# Patient Record
Sex: Male | Born: 1970
Health system: Southern US, Academic
[De-identification: ages and names within clinical notes are randomized; demographics above are authoritative.]

## PROBLEM LIST (undated history)

## (undated) DIAGNOSIS — T7840XA Allergy, unspecified, initial encounter: Secondary | ICD-10-CM

## (undated) DIAGNOSIS — G4730 Sleep apnea, unspecified: Secondary | ICD-10-CM

## (undated) HISTORY — PX: HERNIA REPAIR: SHX51

## (undated) HISTORY — DX: Sleep apnea, unspecified: G47.30

## (undated) HISTORY — DX: Allergy, unspecified, initial encounter: T78.40XA

## (undated) HISTORY — PX: URETEROSCOPY WITH HOLMIUM LASER LITHOTRIPSY: SHX6645

---

## 1995-09-25 ENCOUNTER — Ambulatory Visit (HOSPITAL_COMMUNITY): Payer: Self-pay | Admitting: Family Medicine

## 2005-09-03 ENCOUNTER — Ambulatory Visit: Payer: Self-pay | Admitting: Otolaryngology

## 2007-11-02 ENCOUNTER — Ambulatory Visit: Payer: Self-pay | Admitting: Otolaryngology

## 2008-04-27 ENCOUNTER — Ambulatory Visit: Payer: Self-pay | Admitting: Urology

## 2008-05-05 ENCOUNTER — Ambulatory Visit: Payer: Self-pay | Admitting: Urology

## 2008-07-06 ENCOUNTER — Ambulatory Visit: Payer: Self-pay | Admitting: Urology

## 2008-09-13 ENCOUNTER — Ambulatory Visit: Payer: Self-pay | Admitting: Urology

## 2010-07-15 ENCOUNTER — Emergency Department: Payer: Self-pay | Admitting: Emergency Medicine

## 2011-01-09 ENCOUNTER — Ambulatory Visit: Payer: Self-pay | Admitting: Urology

## 2012-03-08 ENCOUNTER — Ambulatory Visit: Payer: Self-pay | Admitting: Urology

## 2012-11-09 DIAGNOSIS — G35 Multiple sclerosis: Secondary | ICD-10-CM | POA: Insufficient documentation

## 2012-11-09 DIAGNOSIS — Q792 Exomphalos: Secondary | ICD-10-CM

## 2012-11-09 HISTORY — DX: Exomphalos: Q79.2

## 2012-11-09 HISTORY — DX: Multiple sclerosis: G35

## 2012-12-01 DIAGNOSIS — G4733 Obstructive sleep apnea (adult) (pediatric): Secondary | ICD-10-CM | POA: Insufficient documentation

## 2012-12-01 HISTORY — DX: Obstructive sleep apnea (adult) (pediatric): G47.33

## 2013-04-29 ENCOUNTER — Ambulatory Visit: Payer: Self-pay | Admitting: Urology

## 2013-05-09 DIAGNOSIS — N200 Calculus of kidney: Secondary | ICD-10-CM

## 2013-05-09 DIAGNOSIS — N23 Unspecified renal colic: Secondary | ICD-10-CM | POA: Insufficient documentation

## 2013-05-09 HISTORY — DX: Unspecified renal colic: N23

## 2013-05-09 HISTORY — DX: Calculus of kidney: N20.0

## 2014-07-04 DIAGNOSIS — R339 Retention of urine, unspecified: Secondary | ICD-10-CM

## 2014-07-04 DIAGNOSIS — R1030 Lower abdominal pain, unspecified: Secondary | ICD-10-CM

## 2014-07-04 HISTORY — DX: Lower abdominal pain, unspecified: R10.30

## 2014-07-04 HISTORY — DX: Retention of urine, unspecified: R33.9

## 2015-02-28 DIAGNOSIS — M792 Neuralgia and neuritis, unspecified: Secondary | ICD-10-CM | POA: Insufficient documentation

## 2015-02-28 DIAGNOSIS — N411 Chronic prostatitis: Secondary | ICD-10-CM | POA: Insufficient documentation

## 2015-02-28 HISTORY — DX: Chronic prostatitis: N41.1

## 2015-02-28 HISTORY — DX: Neuralgia and neuritis, unspecified: M79.2

## 2015-05-10 ENCOUNTER — Encounter: Payer: Self-pay | Admitting: Family Medicine

## 2015-05-10 ENCOUNTER — Ambulatory Visit (INDEPENDENT_AMBULATORY_CARE_PROVIDER_SITE_OTHER): Payer: BC Managed Care – PPO | Admitting: Family Medicine

## 2015-05-10 VITALS — BP 114/72 | HR 76 | Temp 97.9°F | Resp 16 | Ht 70.0 in | Wt 251.6 lb

## 2015-05-10 DIAGNOSIS — J0100 Acute maxillary sinusitis, unspecified: Secondary | ICD-10-CM

## 2015-05-10 MED ORDER — AMOXICILLIN-POT CLAVULANATE 875-125 MG PO TABS: 1.0000 | ORAL_TABLET | Freq: Two times a day (BID) | ORAL | Status: DC

## 2015-05-10 NOTE — Progress Notes (Signed)
Name: Miguel [name]   MRN: [id number]    DOB: [birth year]   Date:05/10/2015       Progress Note  Subjective  Chief Complaint  Chief Complaint  Patient presents with  . Facial Pain    HPI Here c/o nasal and sinus congestion.  Sx. X 2-3 days.  Nasal mucus brownish with some blood.  + headache and facial pain.  Started as a cold > 1 week ago.  Cold was getting better, then sinus pressure started.  No fever.  Upper teeth hurt.  No problem-specific assessment & plan notes found for this encounter.   Past Medical History  Diagnosis Date  . Allergy   . Sleep apnea     Social History  Substance Use Topics  . Smoking status: Never Smoker   . Smokeless tobacco: Never Used  . Alcohol Use: No     Current outpatient prescriptions:  .  baclofen (LIORESAL) 10 MG tablet, TK 1 T PO  TID PRN FOR SPASMS, Disp: , Rfl: 0 .  Cholecalciferol (D 1000) 1000 UNITS capsule, Take by mouth., Disp: , Rfl:  .  Cyanocobalamin (VITAMIN B-12) 5000 MCG SUBL, Place under the tongue., Disp: , Rfl:  .  gabapentin (NEURONTIN) 300 MG capsule, Take 300 mg by mouth., Disp: , Rfl:  .  ibuprofen (ADVIL,MOTRIN) 800 MG tablet, Take 800 mg by mouth., Disp: , Rfl:  .  interferon beta-1a (AVONEX PREFILLED) 30 MCG/0.5ML PSKT injection, Frequency:1XW   Dosage:0.0     Instructions:  Note:Dose: 30MCG/0.5ML, Disp: , Rfl:   No Known Allergies  Review of Systems  Constitutional: Negative for fever, chills, weight loss and malaise/fatigue.  HENT: Positive for congestion. Negative for sore throat.   Eyes: Negative for blurred vision and double vision.  Respiratory: Negative for cough, shortness of breath and wheezing.   Cardiovascular: Negative for chest pain, palpitations and leg swelling.  Gastrointestinal: Negative for heartburn, abdominal pain and blood in stool.  Genitourinary: Negative for dysuria, urgency and frequency.  Skin: Negative for rash.  Neurological: Positive for headaches. Negative for weakness.       Objective  Filed Vitals:   05/10/15 0947  BP: 114/72  Pulse: 76  Temp: 97.9 F (36.6 C)  Resp: 16  Height: 5\' 10"  (1.778 m)  Weight: 251 lb 9.6 oz (114.125 kg)     Physical Exam  Constitutional: He is well-developed, well-nourished, and in no distress. No distress.  HENT:  Head: Normocephalic and atraumatic.  Right Ear: External ear normal.  Nose: Mucosal edema and rhinorrhea present. Right sinus exhibits maxillary sinus tenderness. Left sinus exhibits maxillary sinus tenderness.  Mouth/Throat: No oropharyngeal exudate, posterior oropharyngeal edema or posterior oropharyngeal erythema.  Cardiovascular: Normal rate and normal heart sounds.  Exam reveals no gallop and no friction rub.   No murmur heard. Pulmonary/Chest: Effort normal and breath sounds normal. No respiratory distress. He has no wheezes. He has no rales.  Lymphadenopathy:    He has no cervical adenopathy.  Vitals reviewed.     No results found for this or any previous visit (from the past 2160 hour(s)).   Assessment & Plan  1. Acute maxillary sinusitis, recurrence not specified  - amoxicillin-clavulanate (AUGMENTIN) 875-125 MG tablet; Take 1 tablet by mouth 2 (two) times daily.  Dispense: 20 tablet; Refill: 0

## 2015-05-10 NOTE — Patient Instructions (Signed)
Use Mucinex D and Afrin as needed

## 2015-07-23 DIAGNOSIS — M25519 Pain in unspecified shoulder: Secondary | ICD-10-CM

## 2015-07-23 HISTORY — DX: Pain in unspecified shoulder: M25.519

## 2015-10-26 DIAGNOSIS — M25559 Pain in unspecified hip: Secondary | ICD-10-CM

## 2015-10-26 HISTORY — DX: Pain in unspecified hip: M25.559

## 2016-01-07 ENCOUNTER — Encounter: Payer: Self-pay | Admitting: Family Medicine

## 2016-01-07 ENCOUNTER — Other Ambulatory Visit: Payer: Self-pay | Admitting: Family Medicine

## 2016-01-07 ENCOUNTER — Ambulatory Visit (INDEPENDENT_AMBULATORY_CARE_PROVIDER_SITE_OTHER): Payer: BC Managed Care – PPO | Admitting: Family Medicine

## 2016-01-07 ENCOUNTER — Ambulatory Visit: Payer: BC Managed Care – PPO | Admitting: Family Medicine

## 2016-01-07 VITALS — BP 131/83 | HR 79 | Temp 98.8°F | Resp 16 | Ht 70.0 in | Wt 252.0 lb

## 2016-01-07 DIAGNOSIS — G35 Multiple sclerosis: Secondary | ICD-10-CM

## 2016-01-07 DIAGNOSIS — E0800 Diabetes mellitus due to underlying condition with hyperosmolarity without nonketotic hyperglycemic-hyperosmolar coma (NKHHC): Secondary | ICD-10-CM

## 2016-01-07 DIAGNOSIS — E119 Type 2 diabetes mellitus without complications: Secondary | ICD-10-CM

## 2016-01-07 DIAGNOSIS — E1169 Type 2 diabetes mellitus with other specified complication: Secondary | ICD-10-CM | POA: Insufficient documentation

## 2016-01-07 LAB — POCT GLYCOSYLATED HEMOGLOBIN (HGB A1C): Hemoglobin A1C: 12.1

## 2016-01-07 MED ORDER — METFORMIN HCL 1000 MG PO TABS: 1000.0000 mg | ORAL_TABLET | Freq: Two times a day (BID) | ORAL | 3 refills | Status: DC

## 2016-01-07 MED ORDER — INSULIN GLARGINE 100 UNIT/ML SOLOSTAR PEN: PEN_INJECTOR | SUBCUTANEOUS | 99 refills | Status: DC

## 2016-01-07 NOTE — Progress Notes (Signed)
Name: Miguel [name]   MRN: [id number]    DOB: [birth year]   Date:01/07/2016       Progress Note  Subjective  Chief Complaint  Chief Complaint  Patient presents with  . Diabetes    HPI  Here because of elevated BS.  B S last night was >400 and >300 this AM.  He has had increased thirst and increased urination but no nocturia.  No excessive fatigue.  Needs reading glasses now at times.  Vision not espessively blurry. No problem-specific Assessment & Plan notes found for this encounter.   Past Medical History:  Diagnosis Date  . Allergy   . Sleep apnea     Past Surgical History:  Procedure Laterality Date  . HERNIA REPAIR      Family History  Problem Relation Age of Onset  . Family history unknown: Yes    Social History   Social History  . Marital status: Married    Spouse name: N/A  . Number of children: N/A  . Years of education: N/A   Occupational History  . Not on file.   Social History Main Topics  . Smoking status: Never Smoker  . Smokeless tobacco: Never Used  . Alcohol use No  . Drug use: No  . Sexual activity: Not on file   Other Topics Concern  . Not on file   Social History Narrative  . No narrative on file     Current Outpatient Prescriptions:  .  baclofen (LIORESAL) 10 MG tablet, TK 1 T PO  TID PRN FOR SPASMS, Disp: , Rfl: 0 .  Cholecalciferol (D 1000) 1000 UNITS capsule, Take by mouth., Disp: , Rfl:  .  Cyanocobalamin (VITAMIN B-12) 5000 MCG SUBL, Place under the tongue., Disp: , Rfl:  .  gabapentin (NEURONTIN) 300 MG capsule, Take 300 mg by mouth continuous as needed. , Disp: , Rfl:  .  ibuprofen (ADVIL,MOTRIN) 800 MG tablet, Take 800 mg by mouth every 6 (six) hours as needed. , Disp: , Rfl:  .  interferon beta-1a (AVONEX PREFILLED) 30 MCG/0.5ML PSKT injection, Frequency:1XW   Dosage:0.0     Instructions:  Note:Dose: 30MCG/0.5ML, Disp: , Rfl:  .  Insulin Glargine (LANTUS SOLOSTAR) 100 UNIT/ML Solostar Pen, Start with 10 units daily and  increase 2 units every 2-3 days until fasting Blood sugar <120/, Disp: 5 pen, Rfl: PRN .  metFORMIN (GLUCOPHAGE) 1000 MG tablet, Take 1 tablet (1,000 mg total) by mouth 2 (two) times daily with a meal., Disp: 180 tablet, Rfl: 3  Not on File   Review of Systems  Constitutional: Negative for chills, fever, malaise/fatigue and weight loss.  HENT: Negative for hearing loss.   Eyes: Negative for blurred vision and double vision.  Respiratory: Negative for cough, shortness of breath and wheezing.   Cardiovascular: Negative for chest pain, palpitations and leg swelling.  Gastrointestinal: Negative for abdominal pain, blood in stool, heartburn and nausea.  Genitourinary: Positive for frequency. Negative for dysuria and urgency.  Musculoskeletal: Positive for myalgias.  Skin: Negative for rash.  Neurological: Negative for tremors, weakness and headaches.      Objective  Vitals:   01/07/16 1623  BP: 131/83  Pulse: 79  Resp: 16  Temp: 98.8 F (37.1 C)  TempSrc: Oral  Weight: 252 lb (114.3 kg)  Height: 5\' 10"  (1.778 m)    Physical Exam  Constitutional: He is oriented to person, place, and time. He appears distressed.  HENT:  Head: Normocephalic and atraumatic.  Eyes: Conjunctivae  and EOM are normal. Pupils are equal, round, and reactive to light. No scleral icterus.  Fundoscopic exam:      The right eye shows no arteriolar narrowing, no AV nicking, no exudate, no hemorrhage and no papilledema.       The left eye shows no arteriolar narrowing, no AV nicking, no exudate, no hemorrhage and no papilledema.  Neck: Normal range of motion. Neck supple. Carotid bruit is not present. No thyromegaly present.  Cardiovascular: Normal rate, regular rhythm and normal heart sounds.  Exam reveals no gallop and no friction rub.   No murmur heard. Pulmonary/Chest: Effort normal and breath sounds normal. No respiratory distress. He has no wheezes. He has no rales.  Abdominal: Soft. Bowel sounds are  normal. He exhibits no distension and no mass. There is no tenderness.  Musculoskeletal: He exhibits no edema.  Lymphadenopathy:    He has no cervical adenopathy.  Neurological: He is alert and oriented to person, place, and time.       Recent Results (from the past 2160 hour(s))  POCT HgB A1C     Status: Abnormal   Collection Time: 01/07/16  4:43 PM  Result Value Ref Range   Hemoglobin A1C 12.1%      Assessment & Plan  Problem List Items Addressed This Visit      Endocrine   Diabetes ([place])   Relevant Medications   metFORMIN (GLUCOPHAGE) 1000 MG tablet   Insulin Glargine (LANTUS SOLOSTAR) 100 UNIT/ML Solostar Pen     Nervous and Auditory   Multiple sclerosis (HCC)    Other Visit Diagnoses    Diabetes mellitus without complication ([place])    -  Primary   Relevant Medications   metFORMIN (GLUCOPHAGE) 1000 MG tablet   Insulin Glargine (LANTUS SOLOSTAR) 100 UNIT/ML Solostar Pen   Other Relevant Orders   POCT HgB A1C (Completed)   COMPLETE METABOLIC PANEL WITH GFR   Lipid Profile   CBC with Differential   TSH      Meds ordered this encounter  Medications  . metFORMIN (GLUCOPHAGE) 1000 MG tablet    Sig: Take 1 tablet (1,000 mg total) by mouth 2 (two) times daily with a meal.    Dispense:  180 tablet    Refill:  3  . Insulin Glargine (LANTUS SOLOSTAR) 100 UNIT/ML Solostar Pen    Sig: Start with 10 units daily and increase 2 units every 2-3 days until fasting Blood sugar <120/    Dispense:  5 pen    Refill:  PRN   1. Diabetes mellitus without complication (HCC)  - POCT HgB A1C-12.1 - COMPLETE METABOLIC PANEL WITH GFR - Lipid Profile - CBC with Differential - TSH - metFORMIN (GLUCOPHAGE) 1000 MG tablet; Take 1 tablet (1,000 mg total) by mouth 2 (two) times daily with a meal.  Dispense: 180 tablet; Refill: 3 - Insulin Glargine (LANTUS SOLOSTAR) 100 UNIT/ML Solostar Pen; Start with 10 units daily and increase 2 units every 2-3 days until fasting Blood sugar <120/   Dispense: 5 pen; Refill: PRN  2. Diabetes mellitus due to underlying condition with hyperosmolarity without coma, without long-term current use of insulin ([place])   3. Multiple sclerosis ([place]) cont meds

## 2016-01-08 ENCOUNTER — Other Ambulatory Visit: Payer: Self-pay | Admitting: Family Medicine

## 2016-01-08 LAB — LIPID PANEL
Cholesterol: 203 mg/dL — ABNORMAL HIGH (ref 125–200)
HDL: 28 mg/dL — AB (ref >=40)
LDL CALC: 128 mg/dL (ref <130)
TRIGLYCERIDES: 235 mg/dL — AB (ref <150)
Total CHOL/HDL Ratio: 7.3 Ratio — ABNORMAL HIGH (ref <=5.0)
VLDL: 47 mg/dL — ABNORMAL HIGH (ref <30)

## 2016-01-08 LAB — COMPLETE METABOLIC PANEL WITH GFR
ALBUMIN: 4.4 g/dL (ref 3.6–5.1)
ALK PHOS: 80 U/L (ref 40–115)
ALT: 56 U/L — AB (ref 9–46)
AST: 49 U/L — ABNORMAL HIGH (ref 10–40)
BILIRUBIN TOTAL: 1.4 mg/dL — AB (ref 0.2–1.2)
BUN: 12 mg/dL (ref 7–25)
CO2: 25 mmol/L (ref 20–31)
Calcium: 9.3 mg/dL (ref 8.6–10.3)
Chloride: 98 mmol/L (ref 98–110)
Creat: 0.79 mg/dL (ref 0.60–1.35)
GLUCOSE: 274 mg/dL — AB (ref 65–99)
Potassium: 4.3 mmol/L (ref 3.5–5.3)
Sodium: 134 mmol/L — ABNORMAL LOW (ref 135–146)
TOTAL PROTEIN: 6.9 g/dL (ref 6.1–8.1)

## 2016-01-08 MED ORDER — ONETOUCH ULTRA SYSTEM W/DEVICE KIT
1.0000 | PACK | Freq: Once | 0 refills | Status: AC
Start: 2016-01-08 — End: 2016-01-08

## 2016-01-08 MED ORDER — ONETOUCH ULTRASOFT LANCETS MISC: 12 refills | Status: DC

## 2016-01-08 MED ORDER — GLUCOSE BLOOD VI STRP: ORAL_STRIP | 12 refills | Status: DC

## 2016-01-09 LAB — CBC WITH DIFFERENTIAL/PLATELET
BASOS PCT: 0 %
Basophils Absolute: 0 cells/uL (ref 0–200)
EOS PCT: 1 %
Eosinophils Absolute: 57 cells/uL (ref 15–500)
HEMATOCRIT: 49.6 % (ref 38.5–50.0)
HEMOGLOBIN: 17.1 g/dL (ref 13.2–17.1)
LYMPHS ABS: 1767 {cells}/uL (ref 850–3900)
Lymphocytes Relative: 31 %
MCH: 31.1 pg (ref 27.0–33.0)
MCHC: 34.5 g/dL (ref 32.0–36.0)
MCV: 90.3 fL (ref 80.0–100.0)
MONO ABS: 513 {cells}/uL (ref 200–950)
MPV: 10.8 fL (ref 7.5–12.5)
Monocytes Relative: 9 %
NEUTROS ABS: 3363 {cells}/uL (ref 1500–7800)
NEUTROS PCT: 59 %
Platelets: 161 10*3/uL (ref 140–400)
RBC: 5.49 MIL/uL (ref 4.20–5.80)
RDW: 12.8 % (ref 11.0–15.0)
WBC: 5.7 10*3/uL (ref 3.8–10.8)

## 2016-01-09 LAB — TSH: TSH: 1.27 m[IU]/L (ref 0.40–4.50)

## 2016-01-10 ENCOUNTER — Ambulatory Visit: Payer: BC Managed Care – PPO | Admitting: Family Medicine

## 2016-01-10 LAB — HEPATITIS C ANTIBODY: HCV AB: NEGATIVE

## 2016-01-14 ENCOUNTER — Ambulatory Visit (INDEPENDENT_AMBULATORY_CARE_PROVIDER_SITE_OTHER): Payer: BC Managed Care – PPO | Admitting: Family Medicine

## 2016-01-14 ENCOUNTER — Encounter: Payer: Self-pay | Admitting: Family Medicine

## 2016-01-14 VITALS — BP 119/79 | HR 76 | Temp 98.6°F | Resp 16 | Ht 70.0 in | Wt 245.0 lb

## 2016-01-14 DIAGNOSIS — E0800 Diabetes mellitus due to underlying condition with hyperosmolarity without nonketotic hyperglycemic-hyperosmolar coma (NKHHC): Secondary | ICD-10-CM

## 2016-01-14 DIAGNOSIS — E785 Hyperlipidemia, unspecified: Secondary | ICD-10-CM | POA: Diagnosis not present

## 2016-01-14 DIAGNOSIS — E1169 Type 2 diabetes mellitus with other specified complication: Secondary | ICD-10-CM

## 2016-01-14 HISTORY — DX: Type 2 diabetes mellitus with other specified complication: E11.69

## 2016-01-14 HISTORY — DX: Hyperlipidemia, unspecified: E78.5

## 2016-01-14 MED ORDER — ATORVASTATIN CALCIUM 20 MG PO TABS: 20.0000 mg | ORAL_TABLET | Freq: Every day | ORAL | 3 refills | Status: DC

## 2016-01-14 NOTE — Progress Notes (Signed)
Name: Miguel [name]   MRN: [id number]    DOB: 10/30/1970   Date:01/14/2016       Progress Note  Subjective  Chief Complaint  Chief Complaint  Patient presents with  . Diabetes    HPI Here for f/u of DM.  He has lost some weight and his BSs have dropped to 130s-150s.Marland Kitchen  He c/o some blurred vision.    No problem-specific Assessment & Plan notes found for this encounter.   Past Medical History:  Diagnosis Date  . Allergy   . Sleep apnea     Past Surgical History:  Procedure Laterality Date  . HERNIA REPAIR      Family History  Problem Relation Age of Onset  . Family history unknown: Yes    Social History   Social History  . Marital status: Married    Spouse name: N/A  . Number of children: N/A  . Years of education: N/A   Occupational History  . Not on file.   Social History Main Topics  . Smoking status: Never Smoker  . Smokeless tobacco: Never Used  . Alcohol use No  . Drug use: No  . Sexual activity: Not on file   Other Topics Concern  . Not on file   Social History Narrative  . No narrative on file     Current Outpatient Prescriptions:  .  baclofen (LIORESAL) 10 MG tablet, TK 1 T PO  TID PRN FOR SPASMS, Disp: , Rfl: 0 .  Cholecalciferol (D 1000) 1000 UNITS capsule, Take by mouth., Disp: , Rfl:  .  Cyanocobalamin (VITAMIN B-12) 5000 MCG SUBL, Place under the tongue., Disp: , Rfl:  .  gabapentin (NEURONTIN) 300 MG capsule, Take 300 mg by mouth continuous as needed. , Disp: , Rfl:  .  glucose blood test strip, Check blood sugar 3 times daily, Disp: 100 each, Rfl: 12 .  ibuprofen (ADVIL,MOTRIN) 800 MG tablet, Take 800 mg by mouth every 6 (six) hours as needed. , Disp: , Rfl:  .  Insulin Glargine (LANTUS SOLOSTAR) 100 UNIT/ML Solostar Pen, Start with 10 units daily and increase 2 units every 2-3 days until fasting Blood sugar <120/, Disp: 5 pen, Rfl: PRN .  Insulin Pen Needle (B-D UF III MINI PEN NEEDLES) 31G X 5 MM MISC, Inject 1 each into the skin  daily., Disp: , Rfl:  .  interferon beta-1a (AVONEX PREFILLED) 30 MCG/0.5ML PSKT injection, Frequency:1XW   Dosage:0.0     Instructions:  Note:Dose: 30MCG/0.5ML, Disp: , Rfl:  .  ketoconazole (NIZORAL) 2 % shampoo, Apply 1 application topically daily as needed., Disp: , Rfl:  .  Lancets (ONETOUCH ULTRASOFT) lancets, Check blood sugar three times daily., Disp: 100 each, Rfl: 12 .  metFORMIN (GLUCOPHAGE) 1000 MG tablet, Take 1 tablet (1,000 mg total) by mouth 2 (two) times daily with a meal., Disp: 180 tablet, Rfl: 3 .  atorvastatin (LIPITOR) 20 MG tablet, Take 1 tablet (20 mg total) by mouth daily., Disp: 90 tablet, Rfl: 3  Not on File   Review of Systems  Constitutional: Negative for chills, fever, malaise/fatigue and weight loss.  HENT: Negative for hearing loss.   Eyes: Negative for blurred vision and double vision.  Respiratory: Negative for cough, shortness of breath and wheezing.   Cardiovascular: Negative for chest pain, palpitations and leg swelling.  Gastrointestinal: Positive for diarrhea. Negative for abdominal pain, blood in stool and heartburn.  Genitourinary: Negative for dysuria, frequency and urgency.  Skin: Negative for rash.  Neurological: Negative for weakness and headaches.      Objective  Vitals:   01/14/16 1025  BP: 119/79  Pulse: 76  Resp: 16  Temp: 98.6 F (37 C)  TempSrc: Oral  Weight: 245 lb (111.1 kg)  Height: 5' 10"  (1.778 m)    Physical Exam  Constitutional: He is oriented to person, place, and time and well-developed, well-nourished, and in no distress. No distress.  HENT:  Head: Normocephalic and atraumatic.  Neck: Normal range of motion. Neck supple. No thyromegaly present.  Cardiovascular: Normal rate, regular rhythm and normal heart sounds.  Exam reveals no gallop and no friction rub.   No murmur heard. Pulmonary/Chest: Effort normal and breath sounds normal. No respiratory distress. He has no wheezes. He has no rales.  Abdominal: Soft.  Bowel sounds are normal. He exhibits no distension and no mass. There is no tenderness.  Musculoskeletal: He exhibits no edema.  Lymphadenopathy:    He has no cervical adenopathy.  Neurological: He is alert and oriented to person, place, and time.  Vitals reviewed.      Recent Results (from the past 2160 hour(s))  COMPLETE METABOLIC PANEL WITH GFR     Status: Abnormal   Collection Time: 01/07/16  8:09 AM  Result Value Ref Range   Sodium 134 (L) 135 - 146 mmol/L   Potassium 4.3 3.5 - 5.3 mmol/L   Chloride 98 98 - 110 mmol/L   CO2 25 20 - 31 mmol/L   Glucose, Bld 274 (H) 65 - 99 mg/dL   BUN 12 7 - 25 mg/dL   Creat 0.79 0.60 - 1.35 mg/dL   Total Bilirubin 1.4 (H) 0.2 - 1.2 mg/dL   Alkaline Phosphatase 80 40 - 115 U/L   AST 49 (H) 10 - 40 U/L   ALT 56 (H) 9 - 46 U/L   Total Protein 6.9 6.1 - 8.1 g/dL   Albumin 4.4 3.6 - 5.1 g/dL   Calcium 9.3 8.6 - 10.3 mg/dL   GFR, Est African American >89 >=60 mL/min   GFR, Est Non African American >89 >=60 mL/min  Lipid Profile     Status: Abnormal   Collection Time: 01/07/16  8:09 AM  Result Value Ref Range   Cholesterol 203 (H) 125 - 200 mg/dL   Triglycerides 235 (H) <150 mg/dL   HDL 28 (L) >=40 mg/dL   Total CHOL/HDL Ratio 7.3 (H) <=5.0 Ratio   VLDL 47 (H) <30 mg/dL   LDL Cholesterol 128 <130 mg/dL    Comment:   Total Cholesterol/HDL Ratio:CHD Risk                        Coronary Heart Disease Risk Table                                        Men       Women          1/2 Average Risk              3.4        3.3              Average Risk              5.0        4.4           2X Average Risk  9.6        7.1           3X Average Risk             23.4       11.0 Use the calculated Patient Ratio above and the CHD Risk table  to determine the patient's CHD Risk.   CBC with Differential     Status: None   Collection Time: 01/07/16  8:09 AM  Result Value Ref Range   WBC 5.7 3.8 - 10.8 K/uL   RBC 5.49 4.20 - 5.80 MIL/uL    Hemoglobin 17.1 13.2 - 17.1 g/dL   HCT 49.6 38.5 - 50.0 %   MCV 90.3 80.0 - 100.0 fL   MCH 31.1 27.0 - 33.0 pg   MCHC 34.5 32.0 - 36.0 g/dL   RDW 12.8 11.0 - 15.0 %   Platelets 161 140 - 400 K/uL   MPV 10.8 7.5 - 12.5 fL   Neutro Abs 3,363 1,500 - 7,800 cells/uL   Lymphs Abs 1,767 850 - 3,900 cells/uL   Monocytes Absolute 513 200 - 950 cells/uL   Eosinophils Absolute 57 15 - 500 cells/uL   Basophils Absolute 0 0 - 200 cells/uL   Neutrophils Relative % 59 %   Lymphocytes Relative 31 %   Monocytes Relative 9 %   Eosinophils Relative 1 %   Basophils Relative 0 %   Smear Review Criteria for review not met   TSH     Status: None   Collection Time: 01/07/16  8:09 AM  Result Value Ref Range   TSH 1.27 0.40 - 4.50 mIU/L  Hepatitis C antibody     Status: None   Collection Time: 01/07/16  8:09 AM  Result Value Ref Range   HCV Ab NEGATIVE NEGATIVE  POCT HgB A1C     Status: Abnormal   Collection Time: 01/07/16  4:43 PM  Result Value Ref Range   Hemoglobin A1C 12.1%      Assessment & Plan  Problem List Items Addressed This Visit      Endocrine   Diabetes ([place]) - Primary   Relevant Medications   atorvastatin (LIPITOR) 20 MG tablet     Other   Hyperlipidemia   Relevant Medications   atorvastatin (LIPITOR) 20 MG tablet    Other Visit Diagnoses   None.     Meds ordered this encounter  Medications  . ketoconazole (NIZORAL) 2 % shampoo    Sig: Apply 1 application topically daily as needed.  . Insulin Pen Needle (B-D UF III MINI PEN NEEDLES) 31G X 5 MM MISC    Sig: Inject 1 each into the skin daily.  Marland Kitchen atorvastatin (LIPITOR) 20 MG tablet    Sig: Take 1 tablet (20 mg total) by mouth daily.    Dispense:  90 tablet    Refill:  3   1. Diabetes mellitus due to underlying condition with hyperosmolarity without coma, without long-term current use of insulin ([place]) Cont Metformin and Lantus Schedule Eye exam at [facility].  2. Hyperlipidemia  - atorvastatin (LIPITOR)  20 MG tablet; Take 1 tablet (20 mg total) by mouth daily.  Dispense: 90 tablet; Refill: 3

## 2016-02-06 NOTE — Progress Notes (Signed)
Eye exam?

## 2016-02-11 ENCOUNTER — Ambulatory Visit (INDEPENDENT_AMBULATORY_CARE_PROVIDER_SITE_OTHER): Payer: BC Managed Care – PPO | Admitting: Family Medicine

## 2016-02-11 ENCOUNTER — Encounter: Payer: Self-pay | Admitting: Family Medicine

## 2016-02-11 VITALS — BP 122/80 | HR 81 | Temp 98.8°F | Resp 16 | Ht 70.0 in | Wt 244.6 lb

## 2016-02-11 DIAGNOSIS — G35 Multiple sclerosis: Secondary | ICD-10-CM

## 2016-02-11 DIAGNOSIS — E0800 Diabetes mellitus due to underlying condition with hyperosmolarity without nonketotic hyperglycemic-hyperosmolar coma (NKHHC): Secondary | ICD-10-CM

## 2016-02-11 DIAGNOSIS — E785 Hyperlipidemia, unspecified: Secondary | ICD-10-CM

## 2016-02-11 DIAGNOSIS — E119 Type 2 diabetes mellitus without complications: Secondary | ICD-10-CM

## 2016-02-11 DIAGNOSIS — Z794 Long term (current) use of insulin: Secondary | ICD-10-CM

## 2016-02-11 MED ORDER — INSULIN GLARGINE 100 UNIT/ML SOLOSTAR PEN: PEN_INJECTOR | SUBCUTANEOUS | 99 refills | Status: DC

## 2016-02-11 NOTE — Patient Instructions (Signed)
To get flu shot at work [facility]) later this fall.

## 2016-02-11 NOTE — Progress Notes (Signed)
Name: Miguel [name]   MRN: [id number]    DOB: January 05, 1971   Date:02/11/2016       Progress Note  Subjective  Chief Complaint  Chief Complaint  Patient presents with  . Diabetes    bs 9/24 am 126/132/161 9/25 am 136    HPI Here for f/u of DM.  He is on Lantus 15 units/d and Metformin.  His BSs range from 110-180.  Most fastings are 120-130s.  He is feeling well  No problem-specific Assessment & Plan notes found for this encounter.   Past Medical History:  Diagnosis Date  . Allergy   . Sleep apnea     Past Surgical History:  Procedure Laterality Date  . HERNIA REPAIR      Family History  Problem Relation Age of Onset  . Family history unknown: Yes    Social History   Social History  . Marital status: Married    Spouse name: N/A  . Number of children: N/A  . Years of education: N/A   Occupational History  . Not on file.   Social History Main Topics  . Smoking status: Never Smoker  . Smokeless tobacco: Never Used  . Alcohol use No  . Drug use: No  . Sexual activity: Not on file   Other Topics Concern  . Not on file   Social History Narrative  . No narrative on file     Current Outpatient Prescriptions:  .  atorvastatin (LIPITOR) 20 MG tablet, Take 1 tablet (20 mg total) by mouth daily., Disp: 90 tablet, Rfl: 3 .  baclofen (LIORESAL) 10 MG tablet, TK 1 T PO  TID PRN FOR SPASMS, Disp: , Rfl: 0 .  Cholecalciferol (D 1000) 1000 UNITS capsule, Take by mouth., Disp: , Rfl:  .  Cyanocobalamin (VITAMIN B-12) 5000 MCG SUBL, Place under the tongue., Disp: , Rfl:  .  gabapentin (NEURONTIN) 300 MG capsule, Take 300 mg by mouth continuous as needed. , Disp: , Rfl:  .  glucose blood test strip, Check blood sugar 3 times daily, Disp: 100 each, Rfl: 12 .  ibuprofen (ADVIL,MOTRIN) 800 MG tablet, Take 800 mg by mouth every 6 (six) hours as needed. , Disp: , Rfl:  .  Insulin Glargine (LANTUS SOLOSTAR) 100 UNIT/ML Solostar Pen, 16 units sub-q each evening, Disp: 5 pen,  Rfl: PRN .  Insulin Pen Needle (B-D UF III MINI PEN NEEDLES) 31G X 5 MM MISC, Inject 1 each into the skin daily., Disp: , Rfl:  .  interferon beta-1a (AVONEX PREFILLED) 30 MCG/0.5ML PSKT injection, Frequency:1XW   Dosage:0.0     Instructions:  Note:Dose: 30MCG/0.5ML, Disp: , Rfl:  .  ketoconazole (NIZORAL) 2 % shampoo, Apply 1 application topically daily as needed., Disp: , Rfl:  .  Lancets (ONETOUCH ULTRASOFT) lancets, Check blood sugar three times daily., Disp: 100 each, Rfl: 12 .  metFORMIN (GLUCOPHAGE) 1000 MG tablet, Take 1 tablet (1,000 mg total) by mouth 2 (two) times daily with a meal., Disp: 180 tablet, Rfl: 3  Not on File   Review of Systems  Constitutional: Negative for chills, fever, malaise/fatigue and weight loss.  HENT: Negative for hearing loss.   Eyes: Negative for blurred vision and double vision.  Respiratory: Negative for cough, shortness of breath and wheezing.   Cardiovascular: Negative for chest pain, palpitations and leg swelling.  Gastrointestinal: Negative for abdominal pain, blood in stool, constipation, diarrhea, heartburn, nausea and vomiting.  Genitourinary: Negative for dysuria, frequency and urgency.  Musculoskeletal: Negative  for joint pain and myalgias.  Skin: Negative for rash.  Neurological: Negative for dizziness, tremors, weakness and headaches.  Psychiatric/Behavioral: Negative for depression. The patient is not nervous/anxious.       Objective  Vitals:   02/11/16 1547  BP: 122/80  Pulse: 81  Resp: 16  Temp: 98.8 F (37.1 C)  TempSrc: Oral  Weight: 244 lb 9.6 oz (110.9 kg)  Height: 5' 10"  (1.778 m)    Physical Exam  Constitutional: He is oriented to person, place, and time and well-developed, well-nourished, and in no distress. No distress.  HENT:  Head: Normocephalic and atraumatic.  Eyes: Conjunctivae and EOM are normal. Pupils are equal, round, and reactive to light.  Neck: Normal range of motion. Neck supple. No thyromegaly  present.  Cardiovascular: Normal rate and regular rhythm.  Exam reveals no gallop and no friction rub.   No murmur heard. Pulmonary/Chest: Effort normal and breath sounds normal. No respiratory distress. He has no wheezes. He has no rales.  Musculoskeletal: He exhibits no edema.  Lymphadenopathy:    He has no cervical adenopathy.  Neurological: He is alert and oriented to person, place, and time.  Vitals reviewed.      Recent Results (from the past 2160 hour(s))  COMPLETE METABOLIC PANEL WITH GFR     Status: Abnormal   Collection Time: 01/07/16  8:09 AM  Result Value Ref Range   Sodium 134 (L) 135 - 146 mmol/L   Potassium 4.3 3.5 - 5.3 mmol/L   Chloride 98 98 - 110 mmol/L   CO2 25 20 - 31 mmol/L   Glucose, Bld 274 (H) 65 - 99 mg/dL   BUN 12 7 - 25 mg/dL   Creat 0.79 0.60 - 1.35 mg/dL   Total Bilirubin 1.4 (H) 0.2 - 1.2 mg/dL   Alkaline Phosphatase 80 40 - 115 U/L   AST 49 (H) 10 - 40 U/L   ALT 56 (H) 9 - 46 U/L   Total Protein 6.9 6.1 - 8.1 g/dL   Albumin 4.4 3.6 - 5.1 g/dL   Calcium 9.3 8.6 - 10.3 mg/dL   GFR, Est African American >89 >=60 mL/min   GFR, Est Non African American >89 >=60 mL/min  Lipid Profile     Status: Abnormal   Collection Time: 01/07/16  8:09 AM  Result Value Ref Range   Cholesterol 203 (H) 125 - 200 mg/dL   Triglycerides 235 (H) <150 mg/dL   HDL 28 (L) >=40 mg/dL   Total CHOL/HDL Ratio 7.3 (H) <=5.0 Ratio   VLDL 47 (H) <30 mg/dL   LDL Cholesterol 128 <130 mg/dL    Comment:   Total Cholesterol/HDL Ratio:CHD Risk                        Coronary Heart Disease Risk Table                                        Men       Women          1/2 Average Risk              3.4        3.3              Average Risk              5.0  4.4           2X Average Risk              9.6        7.1           3X Average Risk             23.4       11.0 Use the calculated Patient Ratio above and the CHD Risk table  to determine the patient's CHD Risk.   CBC with  Differential     Status: None   Collection Time: 01/07/16  8:09 AM  Result Value Ref Range   WBC 5.7 3.8 - 10.8 K/uL   RBC 5.49 4.20 - 5.80 MIL/uL   Hemoglobin 17.1 13.2 - 17.1 g/dL   HCT 49.6 38.5 - 50.0 %   MCV 90.3 80.0 - 100.0 fL   MCH 31.1 27.0 - 33.0 pg   MCHC 34.5 32.0 - 36.0 g/dL   RDW 12.8 11.0 - 15.0 %   Platelets 161 140 - 400 K/uL   MPV 10.8 7.5 - 12.5 fL   Neutro Abs 3,363 1,500 - 7,800 cells/uL   Lymphs Abs 1,767 850 - 3,900 cells/uL   Monocytes Absolute 513 200 - 950 cells/uL   Eosinophils Absolute 57 15 - 500 cells/uL   Basophils Absolute 0 0 - 200 cells/uL   Neutrophils Relative % 59 %   Lymphocytes Relative 31 %   Monocytes Relative 9 %   Eosinophils Relative 1 %   Basophils Relative 0 %   Smear Review Criteria for review not met   TSH     Status: None   Collection Time: 01/07/16  8:09 AM  Result Value Ref Range   TSH 1.27 0.40 - 4.50 mIU/L  Hepatitis C antibody     Status: None   Collection Time: 01/07/16  8:09 AM  Result Value Ref Range   HCV Ab NEGATIVE NEGATIVE  POCT HgB A1C     Status: Abnormal   Collection Time: 01/07/16  4:43 PM  Result Value Ref Range   Hemoglobin A1C 12.1%      Assessment & Plan  Problem List Items Addressed This Visit      Endocrine   Diabetes ([place]) - Primary   Relevant Medications   Insulin Glargine (LANTUS SOLOSTAR) 100 UNIT/ML Solostar Pen     Nervous and Auditory   Multiple sclerosis (HCC)     Other   Hyperlipidemia    Other Visit Diagnoses    Diabetes mellitus without complication (HCC)       Relevant Medications   Insulin Glargine (LANTUS SOLOSTAR) 100 UNIT/ML Solostar Pen      Meds ordered this encounter  Medications  . Insulin Glargine (LANTUS SOLOSTAR) 100 UNIT/ML Solostar Pen    Sig: 16 units sub-q each evening    Dispense:  5 pen    Refill:  PRN   .1. 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 Multiple  sclerosis (HCC) Cont meds  3. Hyperlipidemia  Cont meds 4. Diabetes mellitus without complication (HCC)  - Insulin Glargine (LANTUS SOLOSTAR) 100 UNIT/ML Solostar Pen; 16 units sub-q each evening  Dispense: 5 pen; Refill: PRN

## 2016-04-21 ENCOUNTER — Encounter: Payer: Self-pay | Admitting: Family Medicine

## 2016-04-21 ENCOUNTER — Ambulatory Visit (INDEPENDENT_AMBULATORY_CARE_PROVIDER_SITE_OTHER): Payer: BC Managed Care – PPO | Admitting: Family Medicine

## 2016-04-21 VITALS — BP 117/77 | HR 81 | Temp 98.5°F | Resp 16 | Ht 70.0 in | Wt 233.0 lb

## 2016-04-21 DIAGNOSIS — E784 Other hyperlipidemia: Secondary | ICD-10-CM

## 2016-04-21 DIAGNOSIS — E0800 Diabetes mellitus due to underlying condition with hyperosmolarity without nonketotic hyperglycemic-hyperosmolar coma (NKHHC): Secondary | ICD-10-CM

## 2016-04-21 DIAGNOSIS — Z794 Long term (current) use of insulin: Secondary | ICD-10-CM | POA: Diagnosis not present

## 2016-04-21 DIAGNOSIS — E7849 Other hyperlipidemia: Secondary | ICD-10-CM

## 2016-04-21 MED ORDER — LISINOPRIL 2.5 MG PO TABS: 2.5000 mg | ORAL_TABLET | Freq: Every day | ORAL | 3 refills | Status: DC

## 2016-04-21 NOTE — Progress Notes (Signed)
Name: Miguel [name]   MRN: [id number]    DOB: 1970/09/13   Date:04/21/2016       Progress Note  Subjective  Chief Complaint  Chief Complaint  Patient presents with  . Diabetes    HPI Here for f/u of DM.  BSs 115-150 both fasting and before supper.  He is feeling well.    No problem-specific Assessment & Plan notes found for this encounter.   Past Medical History:  Diagnosis Date  . Allergy   . Sleep apnea     Past Surgical History:  Procedure Laterality Date  . HERNIA REPAIR      Family History  Problem Relation Age of Onset  . Family history unknown: Yes    Social History   Social History  . Marital status: Married    Spouse name: N/A  . Number of children: N/A  . Years of education: N/A   Occupational History  . Not on file.   Social History Main Topics  . Smoking status: Never Smoker  . Smokeless tobacco: Never Used  . Alcohol use No  . Drug use: No  . Sexual activity: Not on file   Other Topics Concern  . Not on file   Social History Narrative  . No narrative on file     Current Outpatient Prescriptions:  .  atorvastatin (LIPITOR) 20 MG tablet, Take 1 tablet (20 mg total) by mouth daily., Disp: 90 tablet, Rfl: 3 .  baclofen (LIORESAL) 10 MG tablet, TK 1 T PO  TID PRN FOR SPASMS, Disp: , Rfl: 0 .  Cholecalciferol (D 1000) 1000 UNITS capsule, Take by mouth., Disp: , Rfl:  .  Cyanocobalamin (VITAMIN B-12) 5000 MCG SUBL, Place under the tongue., Disp: , Rfl:  .  gabapentin (NEURONTIN) 300 MG capsule, Take 300 mg by mouth continuous as needed. , Disp: , Rfl:  .  glucose blood test strip, Check blood sugar 3 times daily, Disp: 100 each, Rfl: 12 .  ibuprofen (ADVIL,MOTRIN) 800 MG tablet, Take 800 mg by mouth every 6 (six) hours as needed. , Disp: , Rfl:  .  Insulin Glargine (LANTUS SOLOSTAR) 100 UNIT/ML Solostar Pen, 16 units sub-q each evening, Disp: 5 pen, Rfl: PRN .  Insulin Pen Needle (B-D UF III MINI PEN NEEDLES) 31G X 5 MM MISC, Inject 1  each into the skin daily., Disp: , Rfl:  .  interferon beta-1a (AVONEX PREFILLED) 30 MCG/0.5ML PSKT injection, Frequency:1XW   Dosage:0.0     Instructions:  Note:Dose: 30MCG/0.5ML, Disp: , Rfl:  .  ketoconazole (NIZORAL) 2 % shampoo, Apply 1 application topically daily as needed., Disp: , Rfl:  .  Lancets (ONETOUCH ULTRASOFT) lancets, Check blood sugar three times daily., Disp: 100 each, Rfl: 12 .  metFORMIN (GLUCOPHAGE) 1000 MG tablet, Take 1 tablet (1,000 mg total) by mouth 2 (two) times daily with a meal., Disp: 180 tablet, Rfl: 3 .  lisinopril (ZESTRIL) 2.5 MG tablet, Take 1 tablet (2.5 mg total) by mouth daily., Disp: 90 tablet, Rfl: 3  Not on File   Review of Systems  Constitutional: Negative for chills, fever, malaise/fatigue and weight loss.  HENT: Negative for hearing loss and tinnitus.   Eyes: Negative for blurred vision and double vision.  Respiratory: Negative for cough, hemoptysis and wheezing.   Cardiovascular: Negative for chest pain, palpitations and leg swelling.  Gastrointestinal: Negative for abdominal pain, blood in stool and heartburn.  Genitourinary: Negative for dysuria, frequency and urgency.  Musculoskeletal: Negative for joint pain  and myalgias.  Skin: Negative for rash.  Neurological: Negative for weakness.      Objective  Vitals:   04/21/16 1521  BP: 117/77  Pulse: 81  Resp: 16  Temp: 98.5 F (36.9 C)  TempSrc: Oral  Weight: 233 lb (105.7 kg)  Height: 5\' 10"  (1.778 m)    Physical Exam  Constitutional: He is oriented to person, place, and time and well-developed, well-nourished, and in no distress. No distress.  HENT:  Head: Normocephalic and atraumatic.  Eyes: Conjunctivae and EOM are normal. Pupils are equal, round, and reactive to light. No scleral icterus.  Neck: Normal range of motion. Neck supple. No thyromegaly present.  Cardiovascular: Normal rate, regular rhythm and normal heart sounds.  Exam reveals no gallop and no friction rub.   No  murmur heard. Pulmonary/Chest: Effort normal and breath sounds normal. No respiratory distress. He has no wheezes. He has no rales.  Musculoskeletal: He exhibits no edema.  Lymphadenopathy:    He has no cervical adenopathy.  Neurological: He is alert and oriented to person, place, and time.  Vitals reviewed.      No results found for this or any previous visit (from the past 2160 hour(s)).   Assessment & Plan  Problem List Items Addressed This Visit      Endocrine   Diabetes ([place]) - Primary   Relevant Medications   lisinopril (ZESTRIL) 2.5 MG tablet   Other Relevant Orders   HgB A1c     Other   Hyperlipidemia   Relevant Medications   lisinopril (ZESTRIL) 2.5 MG tablet      Meds ordered this encounter  Medications  . lisinopril (ZESTRIL) 2.5 MG tablet    Sig: Take 1 tablet (2.5 mg total) by mouth daily.    Dispense:  90 tablet    Refill:  3   1. 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  Dispense: 90 tablet; Refill: 3  2. Other hyperlipidemia Cont Lipitor

## 2016-04-22 ENCOUNTER — Other Ambulatory Visit: Payer: BC Managed Care – PPO

## 2016-04-23 ENCOUNTER — Other Ambulatory Visit: Payer: BC Managed Care – PPO

## 2016-04-23 LAB — HEMOGLOBIN A1C
HEMOGLOBIN A1C: 6.2 % — AB (ref <5.7)
MEAN PLASMA GLUCOSE: 131 mg/dL

## 2016-05-05 DIAGNOSIS — M792 Neuralgia and neuritis, unspecified: Secondary | ICD-10-CM | POA: Insufficient documentation

## 2016-05-05 HISTORY — DX: Neuralgia and neuritis, unspecified: M79.2

## 2016-07-24 ENCOUNTER — Ambulatory Visit (INDEPENDENT_AMBULATORY_CARE_PROVIDER_SITE_OTHER): Payer: BC Managed Care – PPO | Admitting: Family Medicine

## 2016-07-24 ENCOUNTER — Encounter: Payer: Self-pay | Admitting: Family Medicine

## 2016-07-24 ENCOUNTER — Encounter: Payer: Self-pay | Admitting: *Deleted

## 2016-07-24 VITALS — BP 120/79 | HR 68 | Temp 97.9°F | Resp 16 | Ht 70.0 in | Wt 234.0 lb

## 2016-07-24 DIAGNOSIS — Z794 Long term (current) use of insulin: Secondary | ICD-10-CM

## 2016-07-24 DIAGNOSIS — M792 Neuralgia and neuritis, unspecified: Secondary | ICD-10-CM

## 2016-07-24 DIAGNOSIS — E784 Other hyperlipidemia: Secondary | ICD-10-CM

## 2016-07-24 DIAGNOSIS — G35 Multiple sclerosis: Secondary | ICD-10-CM

## 2016-07-24 DIAGNOSIS — E0800 Diabetes mellitus due to underlying condition with hyperosmolarity without nonketotic hyperglycemic-hyperosmolar coma (NKHHC): Secondary | ICD-10-CM | POA: Diagnosis not present

## 2016-07-24 DIAGNOSIS — E7849 Other hyperlipidemia: Secondary | ICD-10-CM

## 2016-07-24 LAB — HEMOGLOBIN A1C
HEMOGLOBIN A1C: 6.4 % — AB (ref <5.7)
Mean Plasma Glucose: 137 mg/dL

## 2016-07-24 NOTE — Progress Notes (Signed)
Name: Miguel [name]   MRN: [id number]    DOB: July 26, 1970   Date:07/24/2016       Progress Note  Subjective  Chief Complaint  Chief Complaint  Patient presents with  . Diabetes    HPI Here for f/o of DM.  BSs run from 100- 135.  His MS is doing well.  He cont ot have some radicular pain from his hernia surgery.  He feels pretty well overall.  No problem-specific Assessment & Plan notes found for this encounter.   Past Medical History:  Diagnosis Date  . Allergy   . Sleep apnea     Past Surgical History:  Procedure Laterality Date  . HERNIA REPAIR      Family History  Problem Relation Age of Onset  . Family history unknown: Yes    Social History   Social History  . Marital status: Married    Spouse name: N/A  . Number of children: N/A  . Years of education: N/A   Occupational History  . Not on file.   Social History Main Topics  . Smoking status: Never Smoker  . Smokeless tobacco: Never Used  . Alcohol use No  . Drug use: No  . Sexual activity: Not on file   Other Topics Concern  . Not on file   Social History Narrative  . No narrative on file     Current Outpatient Prescriptions:  .  atorvastatin (LIPITOR) 20 MG tablet, Take 1 tablet (20 mg total) by mouth daily., Disp: 90 tablet, Rfl: 3 .  baclofen (LIORESAL) 10 MG tablet, TK 1 T PO  TID PRN FOR SPASMS, Disp: , Rfl: 0 .  Cholecalciferol (D 1000) 1000 UNITS capsule, Take by mouth., Disp: , Rfl:  .  Cyanocobalamin (VITAMIN B-12) 5000 MCG SUBL, Place under the tongue., Disp: , Rfl:  .  gabapentin (NEURONTIN) 300 MG capsule, Take 300 mg by mouth continuous as needed. , Disp: , Rfl:  .  glucose blood test strip, Check blood sugar 3 times daily, Disp: 100 each, Rfl: 12 .  ibuprofen (ADVIL,MOTRIN) 800 MG tablet, Take 800 mg by mouth every 6 (six) hours as needed. , Disp: , Rfl:  .  Insulin Glargine (LANTUS SOLOSTAR) 100 UNIT/ML Solostar Pen, 16 units sub-q each evening (Patient taking differently: 14  Units. 16 units sub-q each evening), Disp: 5 pen, Rfl: PRN .  Insulin Pen Needle (B-D UF III MINI PEN NEEDLES) 31G X 5 MM MISC, Inject 1 each into the skin daily., Disp: , Rfl:  .  interferon beta-1a (AVONEX PREFILLED) 30 MCG/0.5ML PSKT injection, Frequency:1XW   Dosage:0.0     Instructions:  Note:Dose: 30MCG/0.5ML, Disp: , Rfl:  .  ketoconazole (NIZORAL) 2 % shampoo, Apply 1 application topically daily as needed., Disp: , Rfl:  .  Lancets (ONETOUCH ULTRASOFT) lancets, Check blood sugar three times daily., Disp: 100 each, Rfl: 12 .  lisinopril (ZESTRIL) 2.5 MG tablet, Take 1 tablet (2.5 mg total) by mouth daily., Disp: 90 tablet, Rfl: 3 .  metFORMIN (GLUCOPHAGE) 1000 MG tablet, Take 1 tablet (1,000 mg total) by mouth 2 (two) times daily with a meal., Disp: 180 tablet, Rfl: 3  Not on File   Review of Systems  Constitutional: Negative for chills, fever, malaise/fatigue and weight loss.  HENT: Negative for hearing loss and tinnitus.   Eyes: Negative for blurred vision and double vision.  Respiratory: Negative for cough, shortness of breath and wheezing.   Cardiovascular: Negative for chest pain, palpitations and  leg swelling.  Gastrointestinal: Negative for abdominal pain, blood in stool and heartburn.  Genitourinary: Negative for dysuria, frequency and urgency.  Musculoskeletal: Positive for back pain and myalgias.  Skin: Negative for rash.  Neurological: Negative for dizziness, tingling, tremors, weakness and headaches.      Objective  Vitals:   07/24/16 0843  BP: 120/79  Pulse: 68  Resp: 16  Temp: 97.9 F (36.6 C)  TempSrc: Oral  Weight: 234 lb (106.1 kg)  Height: 5\' 10"  (1.778 m)    Physical Exam  Constitutional: He is oriented to person, place, and time and well-developed, well-nourished, and in no distress. No distress.  HENT:  Head: Normocephalic and atraumatic.  Eyes: Conjunctivae and EOM are normal. Pupils are equal, round, and reactive to light. No scleral icterus.   Neck: Normal range of motion. Neck supple. Carotid bruit is not present. No thyromegaly present.  Cardiovascular: Normal rate, regular rhythm and normal heart sounds.  Exam reveals no gallop and no friction rub.   No murmur heard. Pulmonary/Chest: Effort normal and breath sounds normal. No respiratory distress. He has no wheezes. He has no rales.  Musculoskeletal: He exhibits no edema.  Lymphadenopathy:    He has no cervical adenopathy.  Neurological: He is alert and oriented to person, place, and time.  Vitals reviewed.      No results found for this or any previous visit (from the past 2160 hour(s)).   Assessment & Plan  Problem List Items Addressed This Visit      Endocrine   Diabetes ([place]) - Primary   Relevant Orders   HgB A1c     Nervous and Auditory   Multiple sclerosis ([place])     Other   Peripheral neuropathic pain   Hyperlipidemia      No orders of the defined types were placed in this encounter. 1. 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 And Lisinopril - HgB A1c  2. Multiple sclerosis (HCC)   3. Peripheral neuropathic pain  Cont Gabapentin 4. Other hyperlipidemia Cont Lipitor

## 2016-08-27 DIAGNOSIS — R310 Gross hematuria: Secondary | ICD-10-CM | POA: Insufficient documentation

## 2016-08-27 HISTORY — DX: Gross hematuria: R31.0

## 2016-10-17 DIAGNOSIS — M7918 Myalgia, other site: Secondary | ICD-10-CM

## 2016-10-17 HISTORY — DX: Myalgia, other site: M79.18

## 2016-11-17 ENCOUNTER — Telehealth: Payer: Self-pay

## 2016-11-17 ENCOUNTER — Other Ambulatory Visit: Payer: Self-pay | Admitting: Family Medicine

## 2016-11-17 DIAGNOSIS — Z794 Long term (current) use of insulin: Principal | ICD-10-CM

## 2016-11-17 DIAGNOSIS — E119 Type 2 diabetes mellitus without complications: Secondary | ICD-10-CM

## 2016-11-17 MED ORDER — BASAGLAR KWIKPEN 100 UNIT/ML ~~LOC~~ SOPN: 14.0000 [IU] | PEN_INJECTOR | Freq: Every day | SUBCUTANEOUS | 3 refills | Status: DC

## 2016-11-17 NOTE — Telephone Encounter (Signed)
Received fax notification on 11/17/16 that patient's Lantus insulin rx for 16 units daily is no longer on preferred list. Recommended preference is Engineer, agricultural, [place]ve[place], [place] [facility].  I will go ahead and send new rx for Basaglar, will forward message to [facility] Staff to notify patient of this change, and he may follow-up as scheduled on 11/25/16 with me as planned.  Same dose Basaglar = Lantus. Essentially same medicine just different formulation.  Nobie Putnam, [facility] 11/17/2016, 3:58 PM

## 2016-11-17 NOTE — Telephone Encounter (Signed)
error 

## 2016-11-18 NOTE — Telephone Encounter (Signed)
LMTCB

## 2016-11-25 ENCOUNTER — Ambulatory Visit (INDEPENDENT_AMBULATORY_CARE_PROVIDER_SITE_OTHER): Payer: BC Managed Care – PPO | Admitting: Family Medicine

## 2016-11-25 ENCOUNTER — Encounter: Payer: Self-pay | Admitting: Family Medicine

## 2016-11-25 ENCOUNTER — Other Ambulatory Visit: Payer: Self-pay | Admitting: Family Medicine

## 2016-11-25 VITALS — BP 132/77 | HR 61 | Temp 98.2°F | Resp 16 | Ht 70.0 in | Wt 234.0 lb

## 2016-11-25 DIAGNOSIS — N200 Calculus of kidney: Secondary | ICD-10-CM | POA: Diagnosis not present

## 2016-11-25 DIAGNOSIS — E1169 Type 2 diabetes mellitus with other specified complication: Secondary | ICD-10-CM

## 2016-11-25 DIAGNOSIS — Z79899 Other long term (current) drug therapy: Secondary | ICD-10-CM

## 2016-11-25 DIAGNOSIS — E119 Type 2 diabetes mellitus without complications: Secondary | ICD-10-CM

## 2016-11-25 DIAGNOSIS — Z794 Long term (current) use of insulin: Secondary | ICD-10-CM

## 2016-11-25 DIAGNOSIS — E785 Hyperlipidemia, unspecified: Secondary | ICD-10-CM

## 2016-11-25 DIAGNOSIS — Z0000 Encounter for general adult medical examination without abnormal findings: Secondary | ICD-10-CM

## 2016-11-25 DIAGNOSIS — G35 Multiple sclerosis: Secondary | ICD-10-CM | POA: Diagnosis not present

## 2016-11-25 DIAGNOSIS — Z114 Encounter for screening for human immunodeficiency virus [HIV]: Secondary | ICD-10-CM

## 2016-11-25 LAB — POCT GLYCOSYLATED HEMOGLOBIN (HGB A1C): Hemoglobin A1C: 6.6 — AB (ref ?–5.7)

## 2016-11-25 NOTE — Patient Instructions (Addendum)
Thank you for coming to the clinic today.  1. A1c 6.6  Please fill new insulin Basaglar (instructions say 14 units daily)  Start 12 units of either Lantus or Basaglar insulin at night - check fasting AM blood sugar, if this is consistently < 150 for up to 3-5 days, then reduce dose by 1 unit. Continue to reduce down to 5-6 units by this method.  If off of insulin completely, and fasting sugar or other sugars >160-180, then need to increase Metformin back to 1000mg  twice daily.  We can consider extended release metformin  May check with insurance company to see cost and coverage.  1. Bydureon BCise (Exenatide ER) - once weekly - this is my preference, very good medicine well tolerated, less side effects of nausea, upset stomach. No dose changes. Cost and coverage is the problem, but we may be able to get it with the coupon card  2. Trulicity (Dulaglutide) - once weekly - this is very good one, usually one of my top choices as well, two doses, 0.75 (likely we would start) and 1.5 max dose. We can use coupon card here too  3. Victoza (Liraglutide) - once DAILY - 3 dose changes 0.6, 1.2 and 1.8, side effects nausea, upset stomach higher on this one but it is still very effective medicine  Please schedule a Follow-up Appointment to: Return in about 3 months (around 02/25/2017) for Annual Physical.  If you have any other questions or concerns, please feel free to call the clinic or send a message through [place]. You may also schedule an earlier appointment if necessary.  Additionally, you may be receiving a survey about your experience at our clinic within a few days to 1 week by e-mail or mail. We value your feedback.  Nobie Putnam, DO [place]

## 2016-11-25 NOTE — Progress Notes (Signed)
Subjective:    Patient ID: Miguel [name], male    DOB: 01/30/1971, 46 y.o.   MRN: [id number]  Miguel [name] is a 46 y.o. male presenting on 11/25/2016 for Diabetes (highest BS 140 and lowest 109)   HPI   CHRONIC DM, Type 2: Reports initial diagnosis >1 year ago, had dramatically elevated A1c up to 12, then started insulin and metformin from prior PCP due to concern with A1c, then repeat A1c checked with dramatic improvement in 6 range. He is unsure if still needs insulin. He was not on chronic prednisone for MS, but on interferon beta, see below. Does admit to steroid injections in past for trigger point and joint injections in January and June CBGs: Does not have written CBG log today, recalls, Avg 120-140, Low none < 100, High < 200. Checks CBGs 1-3x daily AM fasting or 2 hour post-prandial Meds: Lantus 14u nightly still (he never filled Basaglar rx from pharmacy since waited to discuss with doctor first), Metformin 1000mg  BID (only taking mostly at night one dose daily has some GI intolerance on BID dosing) Currently on ACEi Lifestyle: - Diet (Improved DM diet)  - Exercise (Limited exercise) Denies hypoglycemia, polyuria, visual changes, numbness or tingling.  History of Nephrolithiasis: - Followed by [facility], taking Flomax, Oxybutnin, slowly better, has apt next week  Multiple Sclerosis: - Patient reports followed by Dr Otto Herb [facility] Neurology), he has been diagnosed previously with MS but technically states there was only one episode and not consistent with traditional multiple sclerosis recurrences, has been "dormant" for while, had followed with regular MRIs without change, has been stable on interferon beta Avonex per Neurology, monitoring LFTs on this medication   Social History  Substance Use Topics  . Smoking status: Never Smoker  . Smokeless tobacco: Never Used  . Alcohol use No    Review of Systems Per HPI unless specifically  indicated above     Objective:    BP 132/77   Pulse 61   Temp 98.2 F (36.8 C) (Oral)   Resp 16   Ht 5\' 10"  (1.778 m)   Wt 234 lb (106.1 kg)   BMI 33.58 kg/m   Wt Readings from Last 3 Encounters:  11/25/16 234 lb (106.1 kg)  07/24/16 234 lb (106.1 kg)  04/21/16 233 lb (105.7 kg)    Physical Exam  Constitutional: He is oriented to person, place, and time. He appears well-developed and well-nourished. No distress.  Well-appearing, comfortable, cooperative  HENT:  Head: Normocephalic and atraumatic.  Mouth/Throat: Oropharynx is clear and moist.  Eyes: Conjunctivae are normal. Right eye exhibits no discharge. Left eye exhibits no discharge.  Neck: Normal range of motion. Neck supple.  Cardiovascular: Normal rate, regular rhythm, normal heart sounds and intact distal pulses.   No murmur heard. Pulmonary/Chest: Effort normal and breath sounds normal. No respiratory distress. He has no wheezes. He has no rales.  Musculoskeletal: Normal range of motion. He exhibits no edema.  Neurological: He is alert and oriented to person, place, and time.  Skin: Skin is warm and dry. No rash noted. He is not diaphoretic. No erythema.  Psychiatric: He has a normal mood and affect. His behavior is normal.  Well groomed, good eye contact, normal speech and thoughts  Nursing note and vitals reviewed.     Recent Labs  04/21/16 0001 07/24/16 0001 11/25/16 0829  HGBA1C 6.2* 6.4* 6.6*    Results for orders placed or performed in visit on  11/25/16  POCT HgB A1C  Result Value Ref Range   Hemoglobin A1C 6.6 (A) 5.7   Diabetic Foot Exam - Simple   Simple Foot Form Diabetic Foot exam was performed with the following findings:  Yes 11/25/2016  8:45 AM  Visual Inspection No deformities, no ulcerations, no other skin breakdown bilaterally:  Yes See comments:  Yes Sensation Testing Intact to touch and monofilament testing bilaterally:  Yes Pulse Check Posterior Tibialis and Dorsalis pulse intact  bilaterally:  Yes Comments Mild dry skin with callus formation bilateral heels       Assessment & Plan:   Problem List Items Addressed This Visit    Multiple sclerosis ([place])    Stable, currently in remission by report, no flare >5 years Followed by outside Neurology Dr Pamalee Leyden [facility] Neu[facility]), will request outside records, prior MRI Continued on Avenox (interferon beta) per neurology Continue monitor LFTs on medicine Future labs ordered      Controlled type 2 diabetes mellitus without complication ([place]) - Primary    Well-controlled DM with A1c 6.6 (mostly stable with slight inc from 6.2-6.4) Review with A1c 12, reportedly question if accurate from POC reading, but unable to determine. No known complications or hypoglycemia.  Plan:  1. Recently basal insulin switched due to ins preference from Lantus to [employer], recently new rx sent - today advised may reduce dosage, can finish Lantus then switch to new Basaglar, reduce to 12 unit nightly, then titrate down by 1 unit every 3-5 days if fasting AM CBG < 150, if < 5-6 units can contact office on further advice likely can gradually DC off insulin completely. May need to adjust metformin in future either 1000mg  BID vs XR dosing. TBD based on insulin 2. Encourage improved lifestyle - low carb, low sugar diet, reduce portion size, continue improving regular exercise 3. Check CBG, bring log to next visit for review 4. Continue ACEi, Statin 5. DM Foot exam done today / Advised to schedule DM ophtho exam, send record 6. Follow-up 3 months - future labs A1c for annual phys      Relevant Orders   POCT HgB A1C (Completed)   Calculus of kidney    Followed by [facility] Gradual improvement on Flomax, Oxybutnin Follow-up as planned next week      Relevant Medications   oxyCODONE-acetaminophen (PERCOCET/ROXICET) 5-325 MG tablet      Follow up plan: Return in about 3 months (around 02/25/2017) for Annual  Physical.  Nobie Putnam, DO [facility] 11/25/2016, 5:58 PM

## 2016-11-25 NOTE — Assessment & Plan Note (Signed)
Stable, currently in remission by report, no flare >5 years Followed by outside Neurology Dr Pamalee Leyden [facility]), will request outside records, prior MRI Continued on Avenox (interferon beta) per neurology Continue monitor LFTs on medicine Future labs ordered

## 2016-11-25 NOTE — Assessment & Plan Note (Signed)
Followed by [facility] Gradual improvement on Flomax, Oxybutnin Follow-up as planned next week

## 2016-11-25 NOTE — Assessment & Plan Note (Signed)
Well-controlled DM with A1c 6.6 (mostly stable with slight inc from 6.2-6.4) Review with A1c 12, reportedly question if accurate from POC reading, but unable to determine. No known complications or hypoglycemia.  Plan:  1. Recently basal insulin switched due to ins preference from Lantus to [employer], recently new rx sent - today advised may reduce dosage, can finish Lantus then switch to new Basaglar, reduce to 12 unit nightly, then titrate down by 1 unit every 3-5 days if fasting AM CBG < 150, if < 5-6 units can contact office on further advice likely can gradually DC off insulin completely. May need to adjust metformin in future either 1000mg  BID vs XR dosing. TBD based on insulin 2. Encourage improved lifestyle - low carb, low sugar diet, reduce portion size, continue improving regular exercise 3. Check CBG, bring log to next visit for review 4. Continue ACEi, Statin 5. DM Foot exam done today / Advised to schedule DM ophtho exam, send record 6. Follow-up 3 months - future labs A1c for annual phys

## 2016-12-10 ENCOUNTER — Encounter: Payer: Self-pay | Admitting: Family Medicine

## 2016-12-10 ENCOUNTER — Telehealth: Payer: Self-pay

## 2016-12-10 DIAGNOSIS — R7989 Other specified abnormal findings of blood chemistry: Secondary | ICD-10-CM

## 2016-12-10 DIAGNOSIS — R945 Abnormal results of liver function studies: Principal | ICD-10-CM

## 2016-12-10 NOTE — Telephone Encounter (Signed)
Reviewed chart, he had labs from Dr Luan Pulling in 12/2015 that showed mildly elevated LFTs, which in setting of abnormal cholesterol results in suggestive of fatty liver disease. I do not see further discussion in chart regarding ordering an abdominal RUQ Abd [place] for eval of liver, but this is very reasonable to check if patient is interested.  There is not a great treatment for mild to moderate fatty liver disease other than lifestyle changes, diet, exercise, and weight loss. We can certainly check the [place] to learn more about his potential diagnosis and then discuss at future visit in 02/2017.  I have placed future order for RUQ liver US, it will need to be scheduled at [facility] or outpatient imaging and patient notified.  Nobie Putnam, [facility] 12/10/2016, 7:26 PM

## 2016-12-10 NOTE — Telephone Encounter (Signed)
Patients wife called and said that Dr. Luan Pulling had recommend that [facility] have a u/s for fatty liver.  I told her I would send back a message and let you see if this is needed.  She reports that they have meet their deducible and they would like to do this ASAP.

## 2016-12-12 NOTE — Telephone Encounter (Signed)
Pt advised for appt and he wanted to let you know that he is dropping his Lantus to 4 units every day and Blood sugar ranges 120-126 Morning and at Night he had dropped from 14 to 10 yesterday and will continue till finish and will start new one that was Rx.

## 2016-12-15 ENCOUNTER — Other Ambulatory Visit: Payer: Self-pay | Admitting: Family Medicine

## 2016-12-15 ENCOUNTER — Ambulatory Visit
Admission: RE | Admit: 2016-12-15 | Discharge: 2016-12-15 | Disposition: A | Discharge status: Home / Self Care | Payer: BC Managed Care – PPO | Source: Ambulatory Visit | Attending: Family Medicine | Admitting: Family Medicine

## 2016-12-15 DIAGNOSIS — R7989 Other specified abnormal findings of blood chemistry: Secondary | ICD-10-CM | POA: Diagnosis not present

## 2016-12-15 DIAGNOSIS — R945 Abnormal results of liver function studies: Secondary | ICD-10-CM

## 2016-12-15 DIAGNOSIS — R932 Abnormal findings on diagnostic imaging of liver and biliary tract: Secondary | ICD-10-CM | POA: Diagnosis not present

## 2016-12-15 DIAGNOSIS — E785 Hyperlipidemia, unspecified: Secondary | ICD-10-CM | POA: Insufficient documentation

## 2016-12-15 NOTE — Addendum Note (Signed)
Addended by: Olin Hauser on: 12/15/2016 01:17 PM   Modules accepted: Orders

## 2016-12-17 ENCOUNTER — Other Ambulatory Visit: Payer: BC Managed Care – PPO

## 2016-12-17 DIAGNOSIS — Z79899 Other long term (current) drug therapy: Secondary | ICD-10-CM

## 2016-12-17 DIAGNOSIS — E119 Type 2 diabetes mellitus without complications: Secondary | ICD-10-CM

## 2016-12-17 DIAGNOSIS — G35 Multiple sclerosis: Secondary | ICD-10-CM

## 2016-12-17 DIAGNOSIS — Z0000 Encounter for general adult medical examination without abnormal findings: Secondary | ICD-10-CM

## 2016-12-17 DIAGNOSIS — R7989 Other specified abnormal findings of blood chemistry: Secondary | ICD-10-CM

## 2016-12-17 DIAGNOSIS — Z794 Long term (current) use of insulin: Secondary | ICD-10-CM

## 2016-12-17 DIAGNOSIS — E1169 Type 2 diabetes mellitus with other specified complication: Secondary | ICD-10-CM

## 2016-12-17 DIAGNOSIS — E785 Hyperlipidemia, unspecified: Secondary | ICD-10-CM

## 2016-12-17 DIAGNOSIS — R945 Abnormal results of liver function studies: Secondary | ICD-10-CM

## 2016-12-18 LAB — COMPLETE METABOLIC PANEL WITH GFR
ALT: 23 U/L (ref 9–46)
AST: 17 U/L (ref 10–40)
Albumin: 4.5 g/dL (ref 3.6–5.1)
Alkaline Phosphatase: 74 U/L (ref 40–115)
BUN: 16 mg/dL (ref 7–25)
CALCIUM: 9.1 mg/dL (ref 8.6–10.3)
CHLORIDE: 104 mmol/L (ref 98–110)
CO2: 20 mmol/L (ref 20–31)
Creat: 0.71 mg/dL (ref 0.60–1.35)
Glucose, Bld: 127 mg/dL — ABNORMAL HIGH (ref 65–99)
POTASSIUM: 4.1 mmol/L (ref 3.5–5.3)
Sodium: 139 mmol/L (ref 135–146)
Total Bilirubin: 1.1 mg/dL (ref 0.2–1.2)
Total Protein: 6.7 g/dL (ref 6.1–8.1)

## 2016-12-19 ENCOUNTER — Other Ambulatory Visit: Payer: Self-pay | Admitting: Family Medicine

## 2016-12-19 ENCOUNTER — Telehealth: Payer: Self-pay

## 2016-12-19 DIAGNOSIS — R945 Abnormal results of liver function studies: Secondary | ICD-10-CM

## 2016-12-19 DIAGNOSIS — R932 Abnormal findings on diagnostic imaging of liver and biliary tract: Secondary | ICD-10-CM

## 2016-12-19 DIAGNOSIS — K769 Liver disease, unspecified: Secondary | ICD-10-CM

## 2016-12-19 DIAGNOSIS — R7989 Other specified abnormal findings of blood chemistry: Secondary | ICD-10-CM

## 2016-12-19 NOTE — Telephone Encounter (Signed)
Patient has an appointment with Dr. Allen Norris on 02/02/17.  However, his u/s results recommended for him to have a MRI.  He would like to go ahead and get that completed before he sees Dr. Durwin Reges.  Please advise

## 2016-12-19 NOTE — Telephone Encounter (Signed)
Called patient back reviewed his decision, agree with plan to order and obtain MRI before apt with GI 01/2017.  Order placed for MRI Abdomen w and w/o contrast.  Nobie Putnam, DO [facility] 12/19/2016, 5:01 PM

## 2016-12-19 NOTE — Telephone Encounter (Signed)
Will do approval next week for MRI.

## 2016-12-23 ENCOUNTER — Other Ambulatory Visit: Payer: BC Managed Care – PPO

## 2016-12-25 NOTE — Telephone Encounter (Signed)
Approval # [id number] valid from 12/25/2016 till 02/02/17 and appointment is scheduled on 08/17 at 9:30 am pt is aware.

## 2017-01-02 ENCOUNTER — Ambulatory Visit
Admission: RE | Admit: 2017-01-02 | Discharge: 2017-01-02 | Disposition: A | Discharge status: Home / Self Care | Payer: BC Managed Care – PPO | Source: Ambulatory Visit | Attending: Family Medicine | Admitting: Family Medicine

## 2017-01-02 DIAGNOSIS — R932 Abnormal findings on diagnostic imaging of liver and biliary tract: Secondary | ICD-10-CM | POA: Insufficient documentation

## 2017-01-02 DIAGNOSIS — R160 Hepatomegaly, not elsewhere classified: Secondary | ICD-10-CM | POA: Diagnosis not present

## 2017-01-02 DIAGNOSIS — K769 Liver disease, unspecified: Secondary | ICD-10-CM | POA: Insufficient documentation

## 2017-01-02 DIAGNOSIS — D1803 Hemangioma of intra-abdominal structures: Secondary | ICD-10-CM | POA: Insufficient documentation

## 2017-01-02 MED ORDER — GADOBENATE DIMEGLUMINE 529 MG/ML IV SOLN
20.0000 mL | Freq: Once | INTRAVENOUS | Status: AC | PRN
  Administered 2017-01-02: 20 mL via INTRAVENOUS

## 2017-01-09 ENCOUNTER — Other Ambulatory Visit: Payer: Self-pay

## 2017-01-09 DIAGNOSIS — E783 Hyperchylomicronemia: Secondary | ICD-10-CM

## 2017-01-09 MED ORDER — ATORVASTATIN CALCIUM 20 MG PO TABS: 20.0000 mg | ORAL_TABLET | Freq: Every day | ORAL | 3 refills | Status: DC

## 2017-01-26 ENCOUNTER — Other Ambulatory Visit: Payer: Self-pay | Admitting: Family Medicine

## 2017-02-02 ENCOUNTER — Other Ambulatory Visit: Payer: Self-pay

## 2017-02-02 ENCOUNTER — Encounter: Payer: Self-pay | Admitting: Gastroenterology

## 2017-02-02 ENCOUNTER — Ambulatory Visit (INDEPENDENT_AMBULATORY_CARE_PROVIDER_SITE_OTHER): Payer: BC Managed Care – PPO | Admitting: Gastroenterology

## 2017-02-02 VITALS — BP 126/72 | HR 84 | Temp 99.0°F | Ht 70.0 in | Wt 241.0 lb

## 2017-02-02 DIAGNOSIS — R932 Abnormal findings on diagnostic imaging of liver and biliary tract: Secondary | ICD-10-CM | POA: Diagnosis not present

## 2017-02-02 DIAGNOSIS — R748 Abnormal levels of other serum enzymes: Secondary | ICD-10-CM | POA: Diagnosis not present

## 2017-02-02 NOTE — Progress Notes (Signed)
Gastroenterology Consultation  Referring Provider:     Nobie Putnam * Primary Care Physician:  Olin Hauser, DO Primary Gastroenterologist:  Dr. Allen Norris     Reason for Consultation:     Elevated liver enzymes        HPI:   Miguel [name] is a 46 y.o. y/o male referred for consultation & management of elevated liver enzymes by Dr. Parks Ranger, Devonne Doughty, DO.  This patient comes in today with a history of abnormal liver enzymes. The patient had a AST of 49 and ALT of 56 back in August 2017 with repeat labs in July of this year being normal. The patient was then found to have a vague rounded hypoechoic mass like region in the caudate lobe of the liver measuring 3 cm. He was then sent for an MRI that did not show this lesion. The patient denies any fevers chills nausea or vomiting. There is no report of any abdominal pain.  Past Medical History:  Diagnosis Date  . Allergy   . Sleep apnea     Past Surgical History:  Procedure Laterality Date  . HERNIA REPAIR      Prior to Admission medications   Medication Sig Start Date End Date Taking? Authorizing Provider  atorvastatin (LIPITOR) 20 MG tablet Take 1 tablet (20 mg total) by mouth daily. 01/09/17  Yes Karamalegos, Devonne Doughty, DO  B-D UF III MINI PEN NEEDLES 31G X 5 MM MISC USE AS DIRECTED ONCE A DAY 01/27/17  Yes Karamalegos, Devonne Doughty, DO  baclofen (LIORESAL) 10 MG tablet TK 1 T PO  TID PRN FOR SPASMS 03/20/15  Yes [provider]  Cholecalciferol (D 1000) 1000 units capsule Take by mouth.    Yes [provider]  Cyanocobalamin (VITAMIN B-12) 5000 MCG SUBL Place under the tongue.   Yes [provider]  desonide (DESOWEN) 0.05 % cream desonide 0.05 % topical cream   Yes [provider]  gabapentin (NEURONTIN) 300 MG capsule Take 300 mg by mouth continuous as needed.  03/08/12  Yes [provider]  glucose blood test strip Check blood sugar 3 times daily 01/08/16  Yes  Arlis Porta., MD  ibuprofen (ADVIL,MOTRIN) 800 MG tablet Take 800 mg by mouth every 6 (six) hours as needed.  12/12/14  Yes [provider]  interferon beta-1a (AVONEX PREFILLED) 30 MCG/0.5ML PSKT injection Frequency:1XW   Dosage:0.0     Instructions:  Note:Dose: 30MCG/0.5ML 03/08/12  Yes [provider]  ketoconazole (NIZORAL) 2 % shampoo Apply 1 application topically daily as needed. 11/27/15  Yes [provider]  Lancets (ONETOUCH ULTRASOFT) lancets Check blood sugar three times daily. 01/08/16  Yes Arlis Porta., MD  lisinopril (ZESTRIL) 2.5 MG tablet Take 1 tablet (2.5 mg total) by mouth daily. 04/21/16  Yes Arlis Porta., MD  metFORMIN (GLUCOPHAGE) 1000 MG tablet Take 1 tablet (1,000 mg total) by mouth 2 (two) times daily with a meal. 01/07/16  Yes Arlis Porta., MD  oxybutynin (DITROPAN) 5 MG tablet Take 5 mg by mouth. 10/02/16  Yes [provider]  Insulin Glargine (BASAGLAR KWIKPEN) 100 UNIT/ML SOPN Inject 0.14 mLs (14 Units total) into the skin at bedtime. Patient not taking: Reported on 02/02/2017 11/17/16   Olin Hauser, DO  oxyCODONE-acetaminophen (PERCOCET/ROXICET) 5-325 MG tablet Take by mouth. 10/10/16   [provider]  tamsulosin (FLOMAX) 0.4 MG CAPS capsule Take 0.4 mg by mouth. 10/09/16   [provider]    Family History  Problem Relation Age of Onset  . Family history unknown: Yes     Social History  Substance Use Topics  . Smoking status: Never Smoker  . Smokeless tobacco: Never Used  . Alcohol use No    Allergies as of 02/02/2017  . (No Known Allergies)    Review of Systems:    All systems reviewed and negative except where noted in HPI.   Physical Exam:  BP 126/72   Pulse 84   Temp 99 F (37.2 C) (Oral)   Ht 5\' 10"  (1.778 m)   Wt 241 lb (109.3 kg)   BMI 34.58 kg/m  No LMP for male patient. Psych:  Alert and cooperative. Normal mood and affect. General:   Alert,   Well-developed, well-nourished, pleasant and cooperative in NAD Head:  Normocephalic and atraumatic. Eyes:  Sclera clear, no icterus.   Conjunctiva pink. Ears:  Normal auditory acuity. Nose:  No deformity, discharge, or lesions. Mouth:  No deformity or lesions,oropharynx pink & moist. Neck:  Supple; no masses or thyromegaly. Lungs:  Respirations even and unlabored.  Clear throughout to auscultation.   No wheezes, crackles, or rhonchi. No acute distress. Heart:  Regular rate and rhythm; no murmurs, clicks, rubs, or gallops. Abdomen:  Normal bowel sounds.  No bruits.  Soft, non-tender and non-distended without masses, hepatosplenomegaly or hernias noted.  No guarding or rebound tenderness.  Negative Carnett sign.   Rectal:  Deferred.  Msk:  Symmetrical without gross deformities.  Good, equal movement & strength bilaterally. Pulses:  Normal pulses noted. Extremities:  No clubbing or edema.  No cyanosis. Neurologic:  Alert and oriented x3;  grossly normal neurologically. Skin:  Intact without significant lesions or rashes.  No jaundice. Lymph Nodes:  No significant cervical adenopathy. Psych:  Alert and cooperative. Normal mood and affect.  Imaging Studies: No results found.  Assessment and Plan:   Miguel [name] is a 46 y.o. y/o male ho comes in with a history of abnormal liver enzymes in the past. The patient's liver enzymes have been normal recently. The patient has no complaint the present time. The patient will have his blood sent off for alpha-fetoprotein only due to his vague mass seen on the ultrasound. The patient has been reassured that with his normal liver enzymes and his MRI not showing the lesion that is very unlikely that anything is wrong. The patient will be contacted with the results of the blood work.The patient has been explained the plan and agrees with it.  Miguel Lame, MD. [name]   Note: This dictation was prepared with Dragon dictation along with smaller phrase  technology. Any transcriptional errors that result from this process are unintentional.

## 2017-02-03 ENCOUNTER — Telehealth: Payer: Self-pay

## 2017-02-03 LAB — AFP TUMOR MARKER: AFP, Serum, Tumor Marker: 4.3 ng/mL (ref 0.0–8.3)

## 2017-02-03 NOTE — Telephone Encounter (Signed)
Left vm with lab results. 

## 2017-02-03 NOTE — Telephone Encounter (Signed)
-----   Message from [name], MD sent at 02/03/2017  7:59 AM EDT ----- Let the patient know that his tumor marker was negative.

## 2017-02-10 ENCOUNTER — Encounter: Payer: Self-pay | Admitting: Family Medicine

## 2017-02-10 ENCOUNTER — Ambulatory Visit: Payer: BC Managed Care – PPO | Admitting: Family Medicine

## 2017-02-10 ENCOUNTER — Ambulatory Visit (INDEPENDENT_AMBULATORY_CARE_PROVIDER_SITE_OTHER): Payer: BC Managed Care – PPO | Admitting: Family Medicine

## 2017-02-10 ENCOUNTER — Other Ambulatory Visit: Payer: Self-pay | Admitting: Family Medicine

## 2017-02-10 VITALS — BP 126/72 | HR 71 | Temp 98.4°F | Resp 16 | Ht 67.0 in | Wt 234.0 lb

## 2017-02-10 DIAGNOSIS — G35D Multiple sclerosis, unspecified: Secondary | ICD-10-CM

## 2017-02-10 DIAGNOSIS — E1169 Type 2 diabetes mellitus with other specified complication: Secondary | ICD-10-CM

## 2017-02-10 DIAGNOSIS — Z0000 Encounter for general adult medical examination without abnormal findings: Secondary | ICD-10-CM

## 2017-02-10 DIAGNOSIS — R945 Abnormal results of liver function studies: Secondary | ICD-10-CM

## 2017-02-10 DIAGNOSIS — R7989 Other specified abnormal findings of blood chemistry: Secondary | ICD-10-CM

## 2017-02-10 DIAGNOSIS — R200 Anesthesia of skin: Secondary | ICD-10-CM

## 2017-02-10 DIAGNOSIS — Z794 Long term (current) use of insulin: Secondary | ICD-10-CM

## 2017-02-10 DIAGNOSIS — E785 Hyperlipidemia, unspecified: Secondary | ICD-10-CM

## 2017-02-10 DIAGNOSIS — E119 Type 2 diabetes mellitus without complications: Secondary | ICD-10-CM

## 2017-02-10 DIAGNOSIS — Z79899 Other long term (current) drug therapy: Secondary | ICD-10-CM

## 2017-02-10 DIAGNOSIS — G35 Multiple sclerosis: Secondary | ICD-10-CM

## 2017-02-10 MED ORDER — PREDNISONE 10 MG PO TABS: ORAL_TABLET | ORAL | 0 refills | Status: DC

## 2017-02-10 NOTE — Progress Notes (Addendum)
Subjective:    Patient ID: Miguel [name], male    DOB: [birth year], 46 y.o.   MRN: [id number]  Miguel [name] is a 46 y.o. male presenting on 02/10/2017 for Medication Problem (as per pt had numbness on Right side and tingling lips and face and numbness but pain in leg is gone overnight injection for hernia and trigger point in back.)  Patient presents for a same day appointment.  HPI   ANGIOEDEMA / FACIAL NUMBNESS / THIGH NUMBNESS: - Patient presents for new acute problem, onset 5 days ago within < 1 hour after office visit with [facility] on 9/20 with multiple injections, including R ilioinguinal nerve block US guided and multiple trigger point injections on R flank and back / including scapular region, injections were DepoMedrol and Kenalog and Bupivacaine, no known allergies. He has had similar injections done several times in same office over past 9 months, usually does not have a problem tolerating them, and occasionally different side effects with local discomfort or tingling. These were new symptoms, onset while leaving their office felt "abnormal" with some cotton mouth, nauseas, and localized section of R inner thigh felt numb and pins and needles tingling with "heaviness" this improved over 2-3 days then fully resolved. He did take Gabapentin 4 days ago 300mg  1-2 capsules, normally only takes PRN, helps a little. Additionally, more concerning to him, he describes peri-oral numbness and tingling of lips and surrounding face/cheeks bilateral, this seems to have been onset about same time since Friday, but noticed it more over weekend. Has not improved now on day 5. - No other recent medications tried, no other changes. - He does take Lisinopril 2.5mg  daily for renal protection in DM, for over a year and has not had any problem with this before - States these are new symptoms has not had before - He has known PMH of presumed MS followed by Dr Pamalee Leyden [facility] Neurology),  remained stable on treatment with beta avnoex, he has contacted them to check and they do not think this is related to Onset at all, he was advised to come here - Denies any other focal symptoms, weakness, vision changes or loss of vision, chest pain, dyspnea, throat swelling, nausea vomiting, headache   Depression screen [facility] 2/9 02/10/2017 07/24/2016 04/21/2016  Decreased Interest 0 0 0  Down, Depressed, Hopeless 0 0 0  PHQ - 2 Score 0 0 0    Social History  Substance Use Topics  . Smoking status: Never Smoker  . Smokeless tobacco: Never Used  . Alcohol use No    Review of Systems Per HPI unless specifically indicated above     Objective:    BP 126/72   Pulse 71   Temp 98.4 F (36.9 C) (Oral)   Resp 16   Ht 5\' 7"  (1.702 m)   Wt 234 lb (106.1 kg)   BMI 36.65 kg/m   Wt Readings from Last 3 Encounters:  02/10/17 234 lb (106.1 kg)  02/02/17 241 lb (109.3 kg)  11/25/16 234 lb (106.1 kg)    Physical Exam  Constitutional: He is oriented to person, place, and time. He appears well-developed and well-nourished. No distress.  Well-appearing, comfortable, cooperative  HENT:  Head: Normocephalic and atraumatic.  Mouth/Throat: Oropharynx is clear and moist.  Lips appear minimally edematous mostly lower lip but very near normal, seems improved. Tongue and rest of oropharynx non edematous. No narrowing of airway  Mildly flushed face bilateral cheeks.  Eyes:  Conjunctivae are normal. Right eye exhibits no discharge. Left eye exhibits no discharge.  Neck: Normal range of motion. Neck supple. No thyromegaly present.  Cardiovascular: Normal rate, regular rhythm, normal heart sounds and intact distal pulses.   No murmur heard. Pulmonary/Chest: Effort normal and breath sounds normal. No respiratory distress. He has no wheezes. He has no rales.  Musculoskeletal: Normal range of motion. He exhibits no edema.  Upper / Lower Extremities: - Normal muscle tone, strength bilateral upper extremities  5/5, lower extremities 5/5  - Normal Gait  Lymphadenopathy:    He has no cervical adenopathy.  Neurological: He is alert and oriented to person, place, and time.  Distal sensation to light touch intact bilateral upper and lower extremities.  Including focal area R inner upper thigh, previously numb.  Bilateral peri-oral and cheeks mild reduced sensation to light touch vs more tingling subjectively when palpated, still has sensation. No facial asymmetry  Skin: Skin is warm and dry. No rash noted. He is not diaphoretic. No erythema.  Psychiatric: He has a normal mood and affect. His behavior is normal.  Well groomed, good eye contact, normal speech and thoughts  Nursing note and vitals reviewed.       Assessment & Plan:   Problem List Items Addressed This Visit    None    Visit Diagnoses    Peri-oral Numbness vs Angioedema of lips, initial encounter    -  Primary  New onset symptoms consistent with angioedema vs allergic reaction, with peri-oral numbness and facial symptoms without involving throat or airway. Now >5 days, without worsening or improvement.  - No known food allergies or medicine allergies. No history of prior anaphylaxis or similar reaction - Only potential known trigger most likely related to injections received prior with steriods/bupivacaine, however these are not new he has received before  - Well-appearing and stable resp status  Plan: 1. Reassurance since not improving, unlikely anaphylaxis, MS or other focal neurological etiology based on history and exam 2. Reviewed potential etiology - remains unclear. Consider one possible etiology is Lisinopril - will go ahead and STOP this 2.5mg  daily for now to see if any improvement, despite on it for 1 year without symptoms, could still be cause, however unclear why timing related to injections. 2. If not improved within 48 hours, then can start Prednisone dosepak taper 6 days to see if relief 3. Future consider referral  to Allergist if recurrence 4. Also encouraged to review this with his Pain Doctor who performed injections, he says that he has already contacted them and they did not think it was from injection. He has updated his MS physician as well. 5. Strict return precautions when to return vs go to hospital ED if any acute worsening    Relevant Medications   predniSONE (DELTASONE) 10 MG tablet      Meds ordered this encounter  Medications  . predniSONE (DELTASONE) 10 MG tablet    Sig: Take 6 tabs with breakfast Day 1, 5 tabs Day 2, 4 tabs Day 3, 3 tabs Day 4, 2 tabs Day 5, 1 tab Day 6.    Dispense:  21 tablet    Refill:  0    Follow up plan: Return in about 2 weeks (around 02/23/2017) for already scheduled physical.  Nobie Putnam, DO [facility] 02/11/2017, 11:31 AM

## 2017-02-10 NOTE — Patient Instructions (Addendum)
Thank you for coming to the clinic today.  1. STOP Lisinopril 2.5mg  daily for now - I am concerned this may be Angioedema of face/lips, this could be a spontaneous allergic reaction months to years later on this med, possibly it could have been related to the shoulder injections, or an allergic reaction to the medicines used, however these do not seem difference  If not improved within next 48 hours, go ahead and start the Prednisone taper follow instructions for 6 days - While on Prednisone cannot take ibuprofen, advil, naproxen  May continue Gabapentin for now  Please schedule a Follow-up Appointment to: Return in about 2 weeks (around 02/23/2017) for already scheduled physical.  If you have any other questions or concerns, please feel free to call the clinic or send a message through [facility]. You may also schedule an earlier appointment if necessary.  Additionally, you may be receiving a survey about your experience at our clinic within a few days to 1 week by e-mail or mail. We value your feedback.  Nobie Putnam, DO [place] [facility]

## 2017-02-11 ENCOUNTER — Telehealth: Payer: Self-pay | Admitting: Family Medicine

## 2017-02-11 DIAGNOSIS — E119 Type 2 diabetes mellitus without complications: Secondary | ICD-10-CM

## 2017-02-11 DIAGNOSIS — R7989 Other specified abnormal findings of blood chemistry: Secondary | ICD-10-CM

## 2017-02-11 DIAGNOSIS — B351 Tinea unguium: Secondary | ICD-10-CM

## 2017-02-11 DIAGNOSIS — Z794 Long term (current) use of insulin: Principal | ICD-10-CM

## 2017-02-11 DIAGNOSIS — R945 Abnormal results of liver function studies: Secondary | ICD-10-CM

## 2017-02-11 MED ORDER — GLUCOSE BLOOD VI STRP: ORAL_STRIP | 12 refills | Status: DC

## 2017-02-11 MED ORDER — TERBINAFINE HCL 250 MG PO TABS: 250.0000 mg | ORAL_TABLET | Freq: Every day | ORAL | 1 refills | Status: DC

## 2017-02-11 NOTE — Telephone Encounter (Signed)
Called wife back, spoke with [place]. She reports that he was given rx Ketoconazole from other physician, but med interaction. He is asking for rx Terbinafine oral for onychomycosis toenail fungus. I did not rx this yesterday due to other concerns, and advised him to wait for now. She understands this plan. Updated that his perioral numbness possible angioedema improved on R side but has some extension of facial numbness on left, no other symptoms, he overall feels good though. He stopped Lisinopril 2.5mg  yesterday evening. No significant change today. He has not started prednisone yet, has rx now. I advised go ahead and start prednisone today. Remain off Lisinopril. I agree to send in Terbinafine 250mg  daily for 6 weeks for now, #45 then we will re-check LFTs with [facility] at that time, if normal (prior elevated LFTs) then will get refill for 6 more weeks.  Advised her that they should closely monitor his facial / perioral numbness if worsening despite prednisone then may need to re-contact his [facility] Neurology MS office again, maybe visit in person to evaluate it or other recommendations. Otherwise, he may go to hospital if any acute concern for MS flare or CVA if dramatic worsening, clinically still does not seem consistent.  Nobie Putnam, [facility] 02/11/2017, 3:02 PM

## 2017-02-11 NOTE — Telephone Encounter (Signed)
Wife called states that pt need a  Prescription called in for  Lamisil,  Also a refill on Lancets one touch.

## 2017-02-12 ENCOUNTER — Other Ambulatory Visit: Payer: Self-pay

## 2017-02-12 DIAGNOSIS — E119 Type 2 diabetes mellitus without complications: Secondary | ICD-10-CM

## 2017-02-12 DIAGNOSIS — Z794 Long term (current) use of insulin: Principal | ICD-10-CM

## 2017-02-12 MED ORDER — GLUCOSE BLOOD VI STRP: ORAL_STRIP | 12 refills | Status: DC

## 2017-02-16 ENCOUNTER — Other Ambulatory Visit: Payer: BC Managed Care – PPO

## 2017-02-16 DIAGNOSIS — E785 Hyperlipidemia, unspecified: Secondary | ICD-10-CM

## 2017-02-16 DIAGNOSIS — R7989 Other specified abnormal findings of blood chemistry: Secondary | ICD-10-CM

## 2017-02-16 DIAGNOSIS — Z79899 Other long term (current) drug therapy: Secondary | ICD-10-CM

## 2017-02-16 DIAGNOSIS — Z794 Long term (current) use of insulin: Secondary | ICD-10-CM

## 2017-02-16 DIAGNOSIS — Z0000 Encounter for general adult medical examination without abnormal findings: Secondary | ICD-10-CM

## 2017-02-16 DIAGNOSIS — E119 Type 2 diabetes mellitus without complications: Secondary | ICD-10-CM

## 2017-02-16 DIAGNOSIS — E1169 Type 2 diabetes mellitus with other specified complication: Secondary | ICD-10-CM

## 2017-02-16 DIAGNOSIS — G35 Multiple sclerosis: Secondary | ICD-10-CM

## 2017-02-16 DIAGNOSIS — R945 Abnormal results of liver function studies: Secondary | ICD-10-CM

## 2017-02-17 LAB — COMPLETE METABOLIC PANEL WITH GFR
AG Ratio: 2 (calc) (ref 1.0–2.5)
ALKALINE PHOSPHATASE (APISO): 68 U/L (ref 40–115)
ALT: 22 U/L (ref 9–46)
AST: 12 U/L (ref 10–40)
Albumin: 4.3 g/dL (ref 3.6–5.1)
BILIRUBIN TOTAL: 1.1 mg/dL (ref 0.2–1.2)
BUN: 17 mg/dL (ref 7–25)
CHLORIDE: 101 mmol/L (ref 98–110)
CO2: 23 mmol/L (ref 20–32)
Calcium: 8.9 mg/dL (ref 8.6–10.3)
Creat: 0.65 mg/dL (ref 0.60–1.35)
GFR, Est African American: 136 mL/min/{1.73_m2} (ref >=60)
GFR, Est Non African American: 117 mL/min/{1.73_m2} (ref >=60)
GLUCOSE: 154 mg/dL — AB (ref 65–99)
Globulin: 2.2 g/dL (calc) (ref 1.9–3.7)
POTASSIUM: 3.8 mmol/L (ref 3.5–5.3)
Sodium: 136 mmol/L (ref 135–146)
TOTAL PROTEIN: 6.5 g/dL (ref 6.1–8.1)

## 2017-02-17 LAB — LIPID PANEL
Cholesterol: 156 mg/dL (ref <200)
HDL: 39 mg/dL — ABNORMAL LOW (ref >40)
LDL Cholesterol (Calc): 97 mg/dL (calc)
NON-HDL CHOLESTEROL (CALC): 117 mg/dL (ref <130)
TRIGLYCERIDES: 107 mg/dL (ref <150)
Total CHOL/HDL Ratio: 4 (calc) (ref <5.0)

## 2017-02-17 LAB — CBC WITH DIFFERENTIAL/PLATELET
BASOS ABS: 22 {cells}/uL (ref 0–200)
Basophils Relative: 0.2 %
EOS ABS: 22 {cells}/uL (ref 15–500)
EOS PCT: 0.2 %
HCT: 46.2 % (ref 38.5–50.0)
Hemoglobin: 16.3 g/dL (ref 13.2–17.1)
Lymphs Abs: 2192 cells/uL (ref 850–3900)
MCH: 31.6 pg (ref 27.0–33.0)
MCHC: 35.3 g/dL (ref 32.0–36.0)
MCV: 89.5 fL (ref 80.0–100.0)
MONOS PCT: 9.4 %
MPV: 10.2 fL (ref 7.5–12.5)
NEUTROS PCT: 69.9 %
Neutro Abs: 7549 cells/uL (ref 1500–7800)
Platelets: 216 10*3/uL (ref 140–400)
RBC: 5.16 10*6/uL (ref 4.20–5.80)
RDW: 12 % (ref 11.0–15.0)
TOTAL LYMPHOCYTE: 20.3 %
WBC mixed population: 1015 cells/uL — ABNORMAL HIGH (ref 200–950)
WBC: 10.8 10*3/uL (ref 3.8–10.8)

## 2017-02-17 LAB — HEMOGLOBIN A1C
EAG (MMOL/L): 7.9 (calc)
HEMOGLOBIN A1C: 6.6 %{Hb} — AB (ref <5.7)
MEAN PLASMA GLUCOSE: 143 (calc)

## 2017-02-23 ENCOUNTER — Encounter: Payer: Self-pay | Admitting: Family Medicine

## 2017-02-23 ENCOUNTER — Ambulatory Visit (INDEPENDENT_AMBULATORY_CARE_PROVIDER_SITE_OTHER): Payer: BC Managed Care – PPO | Admitting: Family Medicine

## 2017-02-23 ENCOUNTER — Telehealth: Payer: Self-pay

## 2017-02-23 VITALS — BP 110/68 | HR 84 | Temp 99.1°F | Resp 15 | Ht 68.0 in | Wt 232.2 lb

## 2017-02-23 DIAGNOSIS — E785 Hyperlipidemia, unspecified: Secondary | ICD-10-CM

## 2017-02-23 DIAGNOSIS — G35 Multiple sclerosis: Secondary | ICD-10-CM | POA: Diagnosis not present

## 2017-02-23 DIAGNOSIS — E119 Type 2 diabetes mellitus without complications: Secondary | ICD-10-CM

## 2017-02-23 DIAGNOSIS — Z794 Long term (current) use of insulin: Secondary | ICD-10-CM

## 2017-02-23 DIAGNOSIS — R200 Anesthesia of skin: Secondary | ICD-10-CM | POA: Diagnosis not present

## 2017-02-23 DIAGNOSIS — R7989 Other specified abnormal findings of blood chemistry: Secondary | ICD-10-CM

## 2017-02-23 DIAGNOSIS — Z0000 Encounter for general adult medical examination without abnormal findings: Secondary | ICD-10-CM | POA: Diagnosis not present

## 2017-02-23 DIAGNOSIS — R945 Abnormal results of liver function studies: Secondary | ICD-10-CM

## 2017-02-23 DIAGNOSIS — E1169 Type 2 diabetes mellitus with other specified complication: Secondary | ICD-10-CM | POA: Diagnosis not present

## 2017-02-23 MED ORDER — METFORMIN HCL ER 750 MG PO TB24: 750.0000 mg | ORAL_TABLET | Freq: Every day | ORAL | 3 refills | Status: DC

## 2017-02-23 NOTE — Telephone Encounter (Signed)
Discussed with Donnie [place], CMA. She called patient's wife back to answer her questions. He has all of the information he needs already as we discussed these concerns with myself and patient in office visit today. Specifically, we cannot closely control blood sugar while on steroids, he has already raised his basal insulin back up to previous doses 14-16u nightly. Also in visit I recommended switch from metformin 1000mg  nightly to XR 750mg  nightly for better 24 hour coverage. He should review this with his wife as well.  Nobie Putnam, [facility] 02/23/2017, 4:51 PM

## 2017-02-23 NOTE — Telephone Encounter (Signed)
The pt wife called to report that her husband got another Prednisone injection today for his MS. She is concern about elevated bs and wanted to know how they should keep his blood sugar controled. Please advise

## 2017-02-23 NOTE — Assessment & Plan Note (Signed)
Resolved

## 2017-02-23 NOTE — Progress Notes (Signed)
Subjective:    Patient ID: Miguel [name], male    DOB: Jun 21, 1970, 46 y.o.   MRN: [id number]  Miguel [name] is a 46 y.o. male presenting on 02/23/2017 for Annual Exam   HPI   Here for Annual Physical and Lab Review  FOLLOW-UP Peri-oral Tingling/Numbness / History of MS - Last visit with me 02/10/17, for initial visit for same problem, treated with HOLDING Lisinopril 2.5mg  question of possible angioedema, and also treated empirically with Prednisone 6 day taper 60 to 10mg , see prior notes for background information. - Interval update with returned to Neurology due to persistent symptoms, they are pursuing an updated MRI brain to re-evaluate MS however he has been doing very well without issues and they still are unsure that this is related to [place]. Additionally decision to treat him as well through Neurology with "bag steroids" x 3 days - Today patient reports some gradual improved tingling perioral and occasional burning and numb, otherwise mostly unchanged, without worsening - He still thinks most likely related to steroid injections he received as symptoms onset 1 hour after received - Remained off Lisinopril 2.5mg  daily now for 2 weeks without significant change, he does not think this was causing it - He has MRI scheduled for tomorrow - Denies any other new symptoms or new focal changes/weakness or problem, lip or facial swelling airway problem or dyspnea  CHRONIC DM, Type 2: Concern today with recent Prednisone and bag steroids raising his blood sugar, previously he had no high sugar >200, now he has had several. No other changes. He states he was able to titrate down Lantus down to 5-6 units nightly and hopeful to come off in future, but now increased back due to steroids. CBGs: Does not have written CBG log today, recalls, Avg increased, Low none < 100, High >200. Checks CBGs 1-3x daily AM fasting or 2 hour post-prandial Meds: Lantus 14-16u nightly still (he was on low dose 5-6  u nightly he still has rx from generic glargine/basaglar at pharmacy he states), Metformin 1000mg  nightly (reduced from BID due to GI intolerance, interested to switch to XR) Currently on ACEi Lifestyle: - Diet (Improved DM diet) - Exercise (Limited exercise) - Due DM Eye Exam, last done 02/2017  HYPERLIPIDEMIA: - Reports no concerns. Last lipid panel 02/2017, controlled  - Currently taking Atorvastatin 20mg  daily, tolerating well without side effects or myalgias  Health Maintenance: - Due for Flu Shot, declines today - will receive at work  Depression screen [facility] 2/9 02/10/2017 07/24/2016 04/21/2016  Decreased Interest 0 0 0  Down, Depressed, Hopeless 0 0 0  PHQ - 2 Score 0 0 0    Past Medical History:  Diagnosis Date  . Allergy   . Sleep apnea    Past Surgical History:  Procedure Laterality Date  . HERNIA REPAIR     Social History   Social History  . Marital status: Married    Spouse name: N/A  . Number of children: N/A  . Years of education: N/A   Occupational History  . Not on file.   Social History Main Topics  . Smoking status: Never Smoker  . Smokeless tobacco: Never Used  . Alcohol use No  . Drug use: No  . Sexual activity: Not on file   Other Topics Concern  . Not on file   Social History Narrative  . No narrative on file   Family History  Problem Relation Age of Onset  . Family history  unknown: Yes   Current Outpatient Prescriptions on File Prior to Visit  Medication Sig  . atorvastatin (LIPITOR) 20 MG tablet Take 1 tablet (20 mg total) by mouth daily.  . B-D UF III MINI PEN NEEDLES 31G X 5 MM MISC USE AS DIRECTED ONCE A DAY  . baclofen (LIORESAL) 10 MG tablet TK 1 T PO  TID PRN FOR SPASMS  . Cholecalciferol (D 1000) 1000 units capsule Take by mouth.   . Cyanocobalamin (VITAMIN B-12) 5000 MCG SUBL Place under the tongue.  Marland Kitchen desonide (DESOWEN) 0.05 % cream desonide 0.05 % topical cream  . gabapentin (NEURONTIN) 300 MG capsule Take 300 mg by mouth  continuous as needed.   Marland Kitchen glucose blood test strip Check blood sugar 3 times daily  . ibuprofen (ADVIL,MOTRIN) 800 MG tablet Take 800 mg by mouth every 6 (six) hours as needed.   . Insulin Glargine (BASAGLAR KWIKPEN) 100 UNIT/ML SOPN Inject 0.14 mLs (14 Units total) into the skin at bedtime.  . interferon beta-1a (AVONEX PREFILLED) 30 MCG/0.5ML PSKT injection Frequency:1XW   Dosage:0.0     Instructions:  Note:Dose: 30MCG/0.5ML  . ketoconazole (NIZORAL) 2 % shampoo Apply 1 application topically daily as needed.  . Lancets (ONETOUCH ULTRASOFT) lancets Check blood sugar three times daily.  Marland Kitchen lisinopril (ZESTRIL) 2.5 MG tablet Take 1 tablet (2.5 mg total) by mouth daily. (Patient not taking: Reported on 02/23/2017)  . oxybutynin (DITROPAN) 5 MG tablet Take 5 mg by mouth.  . oxyCODONE-acetaminophen (PERCOCET/ROXICET) 5-325 MG tablet Take by mouth.  . tamsulosin (FLOMAX) 0.4 MG CAPS capsule Take 0.4 mg by mouth.  . terbinafine (LAMISIL) 250 MG tablet Take 1 tablet (250 mg total) by mouth daily. For 6 weeks, then may repeat 6 more weeks if unresolved and Liver test normal (Patient not taking: Reported on 02/23/2017)   No current facility-administered medications on file prior to visit.     Review of Systems  Constitutional: Negative for activity change, appetite change, chills, diaphoresis, fatigue, fever and unexpected weight change.  HENT: Negative for congestion, hearing loss and sinus pressure.   Eyes: Negative for visual disturbance.  Respiratory: Negative for apnea, cough, chest tightness, shortness of breath and wheezing.   Cardiovascular: Negative for chest pain, palpitations and leg swelling.  Gastrointestinal: Negative for abdominal distention, abdominal pain, anal bleeding, blood in stool, constipation, diarrhea, nausea and vomiting.  Endocrine: Negative for cold intolerance and polyuria.  Genitourinary: Negative for decreased urine volume, difficulty urinating, dysuria, frequency,  hematuria, testicular pain and urgency.  Musculoskeletal: Negative for arthralgias, back pain and neck pain.  Skin: Negative for rash.  Allergic/Immunologic: Negative for environmental allergies.  Neurological: Positive for numbness (still has residual perioral numbness from last visit). Negative for dizziness, weakness, light-headedness and headaches.  Hematological: Negative for adenopathy.  Psychiatric/Behavioral: Negative for behavioral problems, dysphoric mood and sleep disturbance. The patient is not nervous/anxious.    Per HPI unless specifically indicated above     Objective:    BP 110/68 (BP Location: Left Arm, Patient Position: Sitting, Cuff Size: Normal)   Pulse 84   Temp 99.1 F (37.3 C) (Oral)   Resp 15   Ht 5\' 8"  (1.727 m)   Wt 232 lb 3.2 oz (105.3 kg)   BMI 35.31 kg/m   Wt Readings from Last 3 Encounters:  02/23/17 232 lb 3.2 oz (105.3 kg)  02/10/17 234 lb (106.1 kg)  02/02/17 241 lb (109.3 kg)    Physical Exam  Constitutional: He is oriented to person, place, and  time. He appears well-developed and well-nourished. No distress.  Well-appearing, comfortable, cooperative  HENT:  Head: Normocephalic and atraumatic.  Mouth/Throat: Oropharynx is clear and moist.  Lips appear normal without edema.  Oropharynx normal without edema or erythema.  Eyes: Pupils are equal, round, and reactive to light. Conjunctivae and EOM are normal. Right eye exhibits no discharge. Left eye exhibits no discharge.  Neck: Normal range of motion. Neck supple. No thyromegaly present.  Cardiovascular: Normal rate, regular rhythm, normal heart sounds and intact distal pulses.   No murmur heard. Pulmonary/Chest: Effort normal and breath sounds normal. No respiratory distress. He has no wheezes. He has no rales.  Abdominal: Soft. Bowel sounds are normal. He exhibits no distension and no mass. There is no tenderness.  Musculoskeletal: Normal range of motion. He exhibits no edema or tenderness.    Upper / Lower Extremities: - Normal muscle tone, strength bilateral upper extremities 5/5, lower extremities 5/5  Lymphadenopathy:    He has no cervical adenopathy.  Neurological: He is alert and oriented to person, place, and time.  Distal sensation intact to light touch all extremities  Bilateral persistent peri-oral mild symptoms of tingling subjectively but has intact light touch. No facial asymmetry  Skin: Skin is warm and dry. No rash noted. He is not diaphoretic. No erythema.  Psychiatric: He has a normal mood and affect. His behavior is normal.  Well groomed, good eye contact, normal speech and thoughts  Nursing note and vitals reviewed.    Recent Labs  07/24/16 0001 11/25/16 0829 02/16/17 1143  HGBA1C 6.4* 6.6* 6.6*    Results for orders placed or performed in visit on 02/02/17  AFP tumor marker  Result Value Ref Range   AFP, Serum, Tumor Marker 4.3 0.0 - 8.3 ng/mL      Assessment & Plan:   Problem List Items Addressed This Visit    Controlled type 2 diabetes mellitus without complication ([place])    Still well-controlled DM with A1c 6.6 unchanged, now recent inc CBGs due to prednisone/steroids for perioral numbness/tingling No known complications or hypoglycemia.  Plan:  1. For now can continue current dose basal insulin 10-14u up to 15-16 max if need, raised again due to steroids - Once off steroids, resume lower dose basal insulin goal < 5-6 unit nightly then may discontinue at next visit and switch to GLP1 as discussed if needed - DC Metformin 1000mg  nightly - cannot tolerate BID - START Metformin XR 750mg  nightly 2. Encourage improved lifestyle - low carb, low sugar diet, reduce portion size, continue improving regular exercise 3. Check CBG, bring log to next visit for review 4. Continue Statin - Remain HOLDING Lisinopril 2.5mg  for now - pending current episode of perioral symptoms, if identify other cause then can resume, otherwise if still unknown may  consider switch to Losartan low dose instead for renal protection only OR check yearly urine microalbumin 5. Advised to schedule DM ophtho exam, send record 6. Follow-up 3 months - DM A1c      Relevant Medications   metFORMIN (GLUCOPHAGE-XR) 750 MG 24 hr tablet   RESOLVED: Elevated LFTs    Resolved      Hyperlipidemia associated with type 2 diabetes mellitus (HCC)    Controlled cholesterol on statin and improved lifestyle Last lipid panel 02/2017 Calculated ASCVD 10 yr risk score 3.4%, but diabetic  Plan: 1. Continue current meds - Atorvastatin 20mg  daily 2. Continue ASA 81mg  for primary ASCVD risk reduction 3. Encourage improved lifestyle - low carb/cholesterol, reduce portion  size, continue improving regular exercise 4. Follow-up lipids yearly      Relevant Medications   metFORMIN (GLUCOPHAGE-XR) 750 MG 24 hr tablet   Multiple sclerosis (HCC)    Questionable perioral numbness/tingling recently now x 2 weeks, unsure if related to MS at this time, pending further work-up including MRI by [facility] Neurology, MRI tomorrow - Otherwise has been stable, currently in remission by report, no flare >5 years Followed by outside Neurology Dr Pamalee Leyden [facility] Neurology), will request outside records, prior MRI Continued on Avenox (interferon beta) per neurology Continue monitor LFTs on medicine       Other Visit Diagnoses    Annual physical exam    -  Primary   Perioral numbness       Still residual symptom, 2 weeks now, unresolved but improved on prednisone, now pending Neurology management, steroids, MRI, follow-up. remain off ACEi for now - Clinically NOT consistent with ACEi angioedema at this time, may resume in future      Meds ordered this encounter  Medications  . metFORMIN (GLUCOPHAGE-XR) 750 MG 24 hr tablet    Sig: Take 1 tablet (750 mg total) by mouth daily with supper.    Dispense:  90 tablet    Refill:  3    Follow up plan: Return in about 3 months (around  05/26/2017) for DM A1c, ?Perioral numbness.  Nobie Putnam, [facility] 02/23/2017, 12:19 PM

## 2017-02-23 NOTE — Assessment & Plan Note (Addendum)
Still well-controlled DM with A1c 6.6 unchanged, now recent inc CBGs due to prednisone/steroids for perioral numbness/tingling No known complications or hypoglycemia.  Plan:  1. For now can continue current dose basal insulin 10-14u up to 15-16 max if need, raised again due to steroids - Once off steroids, resume lower dose basal insulin goal < 5-6 unit nightly then may discontinue at next visit and switch to GLP1 as discussed if needed - DC Metformin 1000mg  nightly - cannot tolerate BID - START Metformin XR 750mg  nightly 2. Encourage improved lifestyle - low carb, low sugar diet, reduce portion size, continue improving regular exercise 3. Check CBG, bring log to next visit for review 4. Continue Statin - Remain HOLDING Lisinopril 2.5mg  for now - pending current episode of perioral symptoms, if identify other cause then can resume, otherwise if still unknown may consider switch to Losartan low dose instead for renal protection only OR check yearly urine microalbumin 5. Advised to schedule DM ophtho exam, send record 6. Follow-up 3 months - DM A1c

## 2017-02-23 NOTE — Assessment & Plan Note (Signed)
Questionable perioral numbness/tingling recently now x 2 weeks, unsure if related to [place] at this time, pending further work-up including MRI by [facility] Neurology, MRI tomorrow - Otherwise has been stable, currently in remission by report, no flare >5 years Followed by outside Neurology Dr Pamalee Leyden [facility]), will request outside records, prior MRI Continued on Avenox (interferon beta) per neurology Continue monitor LFTs on medicine

## 2017-02-23 NOTE — Assessment & Plan Note (Signed)
Controlled cholesterol on statin and improved lifestyle Last lipid panel 02/2017 Calculated ASCVD 10 yr risk score 3.4%, but diabetic  Plan: 1. Continue current meds - Atorvastatin 20mg  daily 2. Continue ASA 81mg  for primary ASCVD risk reduction 3. Encourage improved lifestyle - low carb/cholesterol, reduce portion size, continue improving regular exercise 4. Follow-up lipids yearly

## 2017-02-23 NOTE — Patient Instructions (Addendum)
Thank you for coming to the clinic today.  1. A1c 6.6 today, overall I am pleased with lab results  2. Other lab results are good  3. Stay off Lisinopril 2.5mg  for now we can make this decision at next visit, or if your testing shows some other cause of the perioral numbness then go ahead and resume this  4. Switched Metformin 1000mg  to Metformin XR 750mg  nightly  5. In future we can switch you off insulin if doing very well and maybe to GLP1 medicine injectable  Please schedule a Follow-up Appointment to: Return in about 3 months (around 05/26/2017) for DM A1c, ?Perioral numbness.   If you have any other questions or concerns, please feel free to call the clinic or send a message through [facility]. You may also schedule an earlier appointment if necessary.  Additionally, you may be receiving a survey about your experience at our clinic within a few days to 1 week by e-mail or mail. We value your feedback.  Nobie Putnam, DO [place]

## 2017-02-25 ENCOUNTER — Encounter: Payer: BC Managed Care – PPO | Admitting: Family Medicine

## 2017-04-15 ENCOUNTER — Other Ambulatory Visit: Payer: Self-pay | Admitting: Family Medicine

## 2017-04-16 NOTE — Telephone Encounter (Signed)
B-D needles sent to pt pharmacy.

## 2017-05-07 LAB — HM DIABETES EYE EXAM

## 2017-05-26 ENCOUNTER — Encounter: Payer: Self-pay | Admitting: Family Medicine

## 2017-05-26 ENCOUNTER — Ambulatory Visit: Payer: BC Managed Care – PPO | Admitting: Family Medicine

## 2017-05-26 VITALS — BP 138/78 | HR 79 | Temp 98.6°F | Resp 16 | Ht 68.0 in | Wt 240.0 lb

## 2017-05-26 DIAGNOSIS — Z794 Long term (current) use of insulin: Secondary | ICD-10-CM | POA: Diagnosis not present

## 2017-05-26 DIAGNOSIS — E119 Type 2 diabetes mellitus without complications: Secondary | ICD-10-CM | POA: Diagnosis not present

## 2017-05-26 DIAGNOSIS — G35 Multiple sclerosis: Secondary | ICD-10-CM | POA: Diagnosis not present

## 2017-05-26 LAB — POCT GLYCOSYLATED HEMOGLOBIN (HGB A1C): HEMOGLOBIN A1C: 7.4 — AB (ref ?–5.7)

## 2017-05-26 MED ORDER — METFORMIN HCL 1000 MG PO TABS: 1000.0000 mg | ORAL_TABLET | Freq: Every day | ORAL | 2 refills | Status: DC

## 2017-05-26 NOTE — Patient Instructions (Addendum)
Thank you for coming to the office today.  1. A1c 7.4, it is elevated from last 6.6   As discussed, we can continue with current plan for few   2. You may do your own experiment to try to switch Metformin from XR 750 to regular 1000mg  at evening  You may try double Metformin XR 750 x 2 tabs for 1500mg   ------------------------  Call insurance find cost and coverage of the following  We have copay cards to reduce cost to 0 to 25 per manufacturer  1. Ozempic (Semaglutide injection) - start 0.25mg  weekly for 4 weeks then increase to 0.5mg  weekly - This one has best benefit of weight loss and reducing Cardiovascular events  2. Bydureon BCise (Exenatide ER) - once weekly - this is my preference, very good medicine well tolerated, less side effects of nausea, upset stomach. No dose changes. Cost and coverage is the problem, but we may be able to get it with the coupon card  3. Trulicity (Dulaglutide) - once weekly - this is very good one, usually one of my top choices as well, two doses, 0.75 (likely we would start) and 1.5 max dose. We can use coupon card here too  4. Victoza (Liraglutide) - once DAILY - 3 dose changes 0.6, 1.2 and 1.8, side effects nausea, upset stomach higher on this one but it is still very effective medicine   Please schedule a Follow-up Appointment to: Return in about 3 months (around 08/24/2017) for DM A1c, med adjust.   If you have any other questions or concerns, please feel free to call the office or send a message through [place]. You may also schedule an earlier appointment if necessary.  Additionally, you may be receiving a survey about your experience at our office within a few days to 1 week by e-mail or mail. We value your feedback.  Nobie Putnam, DO [place]

## 2017-05-26 NOTE — Assessment & Plan Note (Signed)
Elevated A1c 7.4 from 6.6, now less controlled but still near appropriate range despite significant steroid treatments per Neurology for MS recently No known complications or hypoglycemia. Failed Metformin 1000mg  BID (GI intolerance)  Plan:  1. Long discussion on med options for DM management - explained insulin more and how this is not ideal long-term management if he hasn't tried other very good options now - Patient prefers to avoid major change, now off steroids - Agree to continue for now, use existing Basaglar insulin 14u nightly, titrate +/- as needed, previously able to titrate down to 5-6 units, may try this but likely will need at least 10u regularly for now - Resume previous Metformin 1000mg  nightly, otherwise may try experiment instead to double dose of XR Metformin XR 750mg  x 2 tabs for 1500mg  daily - Discussed GLP1 meds - handout given with names, he is to check cost/coverage, he has commercial ins will given manufacturer coupon copay card if agrees to start, likely can DC his insulin in future 2. Encourage improved lifestyle - low carb, low sugar diet, reduce portion size, continue improving regular exercise 3. Check CBG, bring log to next visit for review - need detailed log with insulin 4. Continue Statin - Remain HOLDING Lisinopril 2.5mg  for now - pending current episode of perioral symptoms, if identify other cause then can resume, otherwise if still unknown may consider switch to Losartan low dose instead for renal protection only OR check yearly urine microalbumin 5. Follow-up 3 months - DM A1c

## 2017-05-26 NOTE — Assessment & Plan Note (Signed)
Stable without flare, previously on steroids per Neurology Reportedly changing MS medication at end of month 05/2017 Followed by Dr Pamalee Leyden [facility] Neurology)

## 2017-05-26 NOTE — Progress Notes (Signed)
Subjective:    Patient ID: Miguel [name], male    DOB: July 11, 1970, 47 y.o.   MRN: [id number]  Miguel [name] is a 47 y.o. male presenting on 05/26/2017 for Diabetes (highest 250 lowest 140)   HPI   FOLLOW-UP CHRONIC DM, Type 2: - Last visit with me 02/23/17, for same problem, treated with adjusted basal glargine insulin dose and switched Metformin from 1000mg  PM to XR 750mg  daily since could not tolerate BID dosing, see prior notes for background information. - Interval update with he finished steroids at that time, and ultimately required repeat "bag steroids" treatment for his MS again, states this affected his sugar. - Today patient reports that he does not think the dosing of metformin with XR once daily is nearly as effective as 1000mg  nightly. Also when he switched from lantus to basaglar he thinks it was not as effective either. - He was down to Lantus 5-6 units only nightly, but unable to titrate off, then had episode or flare with MS required higher doses - Labile CBG on prednisone / bag steroids - He does not have a detailed CBG log, but has his glucometer, avg readings variable from 140-160s avg to 170-200s at highest Meds: Basaglar 14u nightly, Metformin XR 750mg  daily Currently on ACEi Lifestyle: - Diet (Improved DM diet - does admit to difficulty with diet over holidays) - Exercise (Limited exercise) - Admits wt gain with steroids Denies hypoglycemia, polyuria, visual changes, numbness or tingling.  Multiple Sclerosis - Followed by [facility] Neurology, has had issue with numbness perioral and possible flare over past few months, he was treated with steroids, and ultimately plan is to change medication at end of month, 05/2017  Depression screen [facility] 2/9 05/26/2017 02/10/2017 07/24/2016  Decreased Interest 0 0 0  Down, Depressed, Hopeless 0 0 0  PHQ - 2 Score 0 0 0    Social History   Tobacco Use  . Smoking status: Never Smoker  . Smokeless tobacco: Never Used    Substance Use Topics  . Alcohol use: No  . Drug use: No    Review of Systems Per HPI unless specifically indicated above     Objective:    BP 138/78   Pulse 79   Temp 98.6 F (37 C) (Oral)   Resp 16   Ht 5\' 8"  (1.727 m)   Wt 240 lb (108.9 kg)   BMI 36.49 kg/m   Wt Readings from Last 3 Encounters:  05/26/17 240 lb (108.9 kg)  02/23/17 232 lb 3.2 oz (105.3 kg)  02/10/17 234 lb (106.1 kg)    Physical Exam  Constitutional: He is oriented to person, place, and time. He appears well-developed and well-nourished. No distress.  Well-appearing, comfortable, cooperative  HENT:  Head: Normocephalic and atraumatic.  Mouth/Throat: Oropharynx is clear and moist.  Eyes: Conjunctivae are normal. Right eye exhibits no discharge. Left eye exhibits no discharge.  Neck: Normal range of motion. Neck supple. No thyromegaly present.  Cardiovascular: Normal rate, regular rhythm, normal heart sounds and intact distal pulses.  No murmur heard. Pulmonary/Chest: Effort normal and breath sounds normal. No respiratory distress. He has no wheezes. He has no rales.  Musculoskeletal: Normal range of motion. He exhibits no edema.  Lymphadenopathy:    He has no cervical adenopathy.  Neurological: He is alert and oriented to person, place, and time.  Skin: Skin is warm and dry. No rash noted. He is not diaphoretic. No erythema.  Psychiatric: He has a normal  mood and affect. His behavior is normal.  Well groomed, good eye contact, normal speech and thoughts  Nursing note and vitals reviewed.  Results for orders placed or performed in visit on 05/26/17  POCT HgB A1C  Result Value Ref Range   Hemoglobin A1C 7.4 (A) 5.7   Recent Labs    11/25/16 0829 02/16/17 1143 05/26/17 0828  HGBA1C 6.6* 6.6* 7.4*       Assessment & Plan:   Problem List Items Addressed This Visit    Controlled type 2 diabetes mellitus without complication ([place]) - Primary    Elevated A1c 7.4 from 6.6, now less controlled  but still near appropriate range despite significant steroid treatments per Neurology for MS recently No known complications or hypoglycemia. Failed Metformin 1000mg  BID (GI intolerance)  Plan:  1. Long discussion on med options for DM management - explained insulin more and how this is not ideal long-term management if he hasn't tried other very good options now - Patient prefers to avoid major change, now off steroids - Agree to continue for now, use existing Basaglar insulin 14u nightly, titrate +/- as needed, previously able to titrate down to 5-6 units, may try this but likely will need at least 10u regularly for now - Resume previous Metformin 1000mg  nightly, otherwise may try experiment instead to double dose of XR Metformin XR 750mg  x 2 tabs for 1500mg  daily - Discussed GLP1 meds - handout given with names, he is to check cost/coverage, he has commercial ins will given manufacturer coupon copay card if agrees to start, likely can DC his insulin in future 2. Encourage improved lifestyle - low carb, low sugar diet, reduce portion size, continue improving regular exercise 3. Check CBG, bring log to next visit for review - need detailed log with insulin 4. Continue Statin - Remain HOLDING Lisinopril 2.5mg  for now - pending current episode of perioral symptoms, if identify other cause then can resume, otherwise if still unknown may consider switch to Losartan low dose instead for renal protection only OR check yearly urine microalbumin 5. Follow-up 3 months - DM A1c      Relevant Medications   metFORMIN (GLUCOPHAGE) 1000 MG tablet   Other Relevant Orders   POCT HgB A1C (Completed)   Multiple sclerosis (HCC)    Stable without flare, previously on steroids per Neurology Reportedly changing MS medication at end of month 05/2017 Followed by Dr Pamalee Leyden [facility] Neurology)         Meds ordered this encounter  Medications  . metFORMIN (GLUCOPHAGE) 1000 MG tablet    Sig: Take 1 tablet  (1,000 mg total) by mouth daily with supper.    Dispense:  30 tablet    Refill:  2    Follow up plan: Return in about 3 months (around 08/24/2017) for DM A1c, med adjust.  A total of >25 minutes was spent face-to-face with this patient. Greater than 50% of this time was spent in counseling on diabetes management, med options, treatment regimen, lifestyle changes, future complications.  Nobie Putnam, [facility] 05/26/2017, 11:48 PM

## 2017-08-02 ENCOUNTER — Other Ambulatory Visit: Payer: Self-pay | Admitting: Family Medicine

## 2017-08-28 ENCOUNTER — Encounter: Payer: Self-pay | Admitting: Family Medicine

## 2017-08-28 ENCOUNTER — Ambulatory Visit: Payer: BC Managed Care – PPO | Admitting: Family Medicine

## 2017-08-28 VITALS — BP 125/73 | HR 66 | Temp 97.7°F | Resp 16 | Ht 68.0 in | Wt 242.0 lb

## 2017-08-28 DIAGNOSIS — Z794 Long term (current) use of insulin: Secondary | ICD-10-CM

## 2017-08-28 DIAGNOSIS — E118 Type 2 diabetes mellitus with unspecified complications: Secondary | ICD-10-CM

## 2017-08-28 LAB — POCT GLYCOSYLATED HEMOGLOBIN (HGB A1C): Hemoglobin A1C: 7.5 — AB (ref ?–5.7)

## 2017-08-28 NOTE — Progress Notes (Signed)
Subjective:    Patient ID: Miguel [name], male    DOB: 18-Jul-1970, 47 y.o.   MRN: [id number]  Miguel [name] is a 47 y.o. male presenting on 08/28/2017 for Diabetes   HPI    FOLLOW-UP CHRONIC DM, Type 2: - Last visit with me 05/26/17, for same problem, see prior notes for background information. - Interval update has switched MS meds per Neurology no longer avonex now on Ocrevus and will get half dose x2 over 2 weeks every 6 months accompanied by steroid treatment (less potent than previous bag steroids) - Today patient reports he has been through a few changes with DM treatments - CBG log: avg 140-160 AM fasting, high up to 200 in AM, PM sugar avg 160-170, high 209. Without hypoglycemia. Checks BID Meds: Basaglar 14u nightly (unchanged since last visit), Metformin 1000mg  IR daily PM - He was off Metformin for period of time, tried the XR 750mg  x 1 or 2 tabs did not seem to control sugars, switched back to IR - He did not inquire about new GLP meds Currently on ACEi Lifestyle: - Diet (Improved DM diet) - Exercise (Limited exercise still but plans to improve - Admits wt loss down 8-10 lbs then gain again Denies hypoglycemia, polyuria, visual changes, numbness or tingling.  Depression screen [facility] 2/9 08/28/2017 05/26/2017 02/10/2017  Decreased Interest 0 0 0  Down, Depressed, Hopeless 0 0 0  PHQ - 2 Score 0 0 0    Social History   Tobacco Use  . Smoking status: Never Smoker  . Smokeless tobacco: Never Used  Substance Use Topics  . Alcohol use: No  . Drug use: No    Review of Systems Per HPI unless specifically indicated above     Objective:    BP 125/73   Pulse 66   Temp 97.7 F (36.5 C) (Oral)   Resp 16   Ht 5\' 8"  (1.727 m)   Wt 242 lb (109.8 kg)   BMI 36.80 kg/m   Wt Readings from Last 3 Encounters:  08/28/17 242 lb (109.8 kg)  05/26/17 240 lb (108.9 kg)  02/23/17 232 lb 3.2 oz (105.3 kg)    Physical Exam  Constitutional: He is oriented to person,  place, and time. He appears well-developed and well-nourished. No distress.  Well-appearing, comfortable, cooperative  HENT:  Head: Normocephalic and atraumatic.  Mouth/Throat: Oropharynx is clear and moist.  Eyes: Conjunctivae are normal. Right eye exhibits no discharge. Left eye exhibits no discharge.  Neck: Normal range of motion. Neck supple. No thyromegaly present.  Cardiovascular: Normal rate, regular rhythm, normal heart sounds and intact distal pulses.  No murmur heard. Pulmonary/Chest: Effort normal and breath sounds normal. No respiratory distress. He has no wheezes. He has no rales.  Musculoskeletal: Normal range of motion. He exhibits no edema.  Lymphadenopathy:    He has no cervical adenopathy.  Neurological: He is alert and oriented to person, place, and time.  Skin: Skin is warm and dry. No rash noted. He is not diaphoretic. No erythema.  Psychiatric: He has a normal mood and affect. His behavior is normal.  Well groomed, good eye contact, normal speech and thoughts  Nursing note and vitals reviewed.  Results for orders placed or performed in visit on 08/28/17  POCT HgB A1C  Result Value Ref Range   Hemoglobin A1C 7.5 (A) 5.7   Recent Labs    02/16/17 1143 05/26/17 0828 08/28/17 0835  HGBA1C 6.6* 7.4* 7.5*  Assessment & Plan:   Problem List Items Addressed This Visit    Controlled diabetes mellitus type 2 with complications ([place]) - Primary    A1c still mildly elevated to 7.5, from prior 7.4, changed back on Metformin. Did not add GLP1 On new MS medication q 6 month with steroids at dosing No known complications or hypoglycemia. Failed Metformin 1000mg  BID (GI intolerance), XR was ineffective  Plan:  1. Again discussion on med options for DM management - RECOMMEND new start GLP1 - he will check cost coverage now, he did not do this last time, demo given today, he will return for copay card - CONTINUE Basaglar 14u nightly for now - goal to titrate down and  possibly off if stable on GLP1 - per AVS - CONTINUE Metformin IR 1000mg  nightly wc for now - once on GLP1 for 1-2 weeks may DC this as expected 2. Encourage improved lifestyle - low carb, low sugar diet, reduce portion size, continue improving regular exercise 3. Check CBG, bring log to next visit for review again 4. Continue Statin May resume Lisinopril 2.5 / future check microalbumin 5. Follow-up 3 months - DM A1c      Relevant Orders   POCT HgB A1C (Completed)      No orders of the defined types were placed in this encounter.     Follow up plan: Return in about 3 months (around 11/27/2017) for DM A1c med adjust.  Nobie Putnam, DO [facility] 08/28/2017, 8:53 AM

## 2017-08-28 NOTE — Patient Instructions (Addendum)
Thank you for coming to the office today.  No medicine changes today - continue current doses  Continue Metformin immediate 1000mg  nightly with dinner for now  Continue Basaglar 14u nightly  Once we start the new injectable medicine - follow sugar closely for 2 weeks and then may try STOPPING metformin, and can gradually lower Basaglar insulin by 2 units every 1 week as long as fasting sugar is < 150.  Call insurance find cost and coverage of the following - come by to pick up copay card from our office to turn into pharmacy  1. Ozempic (Semaglutide injection) - start 0.25mg  weekly for 4 weeks then increase to 0.5mg  weekly - This one has best benefit of weight loss and reducing Cardiovascular events  2. Bydureon BCise (Exenatide ER) - once weekly - this is my preference, very good medicine well tolerated, less side effects of nausea, upset stomach. No dose changes. Cost and coverage is the problem, but we may be able to get it with the coupon card  3. Trulicity (Dulaglutide) - once weekly - this is very good one, usually one of my top choices as well, two doses, 0.75 (likely we would start) and 1.5 max dose. We can use coupon card here too  4. Victoza (Liraglutide) - once DAILY - 3 dose changes 0.6, 1.2 and 1.8, side effects nausea, upset stomach higher on this one but it is still very effective medicine   Please schedule a Follow-up Appointment to: Return in about 3 months (around 11/27/2017) for DM A1c med adjust.  If you have any other questions or concerns, please feel free to call the office or send a message through [place]. You may also schedule an earlier appointment if necessary.  Additionally, you may be receiving a survey about your experience at our office within a few days to 1 week by e-mail or mail. We value your feedback.  Nobie Putnam, DO [place]

## 2017-08-28 NOTE — Assessment & Plan Note (Signed)
A1c still mildly elevated to 7.5, from prior 7.4, changed back on Metformin. Did not add GLP1 On new MS medication q 6 month with steroids at dosing No known complications or hypoglycemia. Failed Metformin 1000mg  BID (GI intolerance), XR was ineffective  Plan:  1. Again discussion on med options for DM management - RECOMMEND new start GLP1 - he will check cost coverage now, he did not do this last time, demo given today, he will return for copay card - CONTINUE Basaglar 14u nightly for now - goal to titrate down and possibly off if stable on GLP1 - per AVS - CONTINUE Metformin IR 1000mg  nightly wc for now - once on GLP1 for 1-2 weeks may DC this as expected 2. Encourage improved lifestyle - low carb, low sugar diet, reduce portion size, continue improving regular exercise 3. Check CBG, bring log to next visit for review again 4. Continue Statin May resume Lisinopril 2.5 / future check microalbumin 5. Follow-up 3 months - DM A1c

## 2017-09-04 ENCOUNTER — Telehealth: Payer: Self-pay | Admitting: Family Medicine

## 2017-09-04 DIAGNOSIS — E118 Type 2 diabetes mellitus with unspecified complications: Secondary | ICD-10-CM

## 2017-09-04 MED ORDER — SEMAGLUTIDE(0.25 OR 0.5MG/DOS) 2 MG/1.5ML ~~LOC~~ SOPN
0.5000 mg | PEN_INJECTOR | SUBCUTANEOUS | 2 refills | Status: DC
Start: 2017-09-04 — End: 2017-12-02

## 2017-09-04 NOTE — Telephone Encounter (Signed)
Sent rx Ozempic today. Patient or wife may come by office if they need discount card, to submit to pharmacy - starting NEXT week, anytime on Monday 4/22 or later.  Nobie Putnam, [facility] 09/04/2017, 3:29 PM

## 2017-09-04 NOTE — Telephone Encounter (Signed)
Pt's wife said ozempic is a covered drug.   Please send to [facility] in [place] and she did asked about a discount card [phone]

## 2017-09-07 NOTE — Telephone Encounter (Signed)
Left detail message. 

## 2017-11-09 ENCOUNTER — Other Ambulatory Visit: Payer: Self-pay | Admitting: Family Medicine

## 2017-11-09 DIAGNOSIS — E118 Type 2 diabetes mellitus with unspecified complications: Secondary | ICD-10-CM

## 2017-11-09 DIAGNOSIS — Z794 Long term (current) use of insulin: Principal | ICD-10-CM

## 2017-11-11 ENCOUNTER — Telehealth: Payer: Self-pay | Admitting: Family Medicine

## 2017-11-11 NOTE — Telephone Encounter (Signed)
Pt needs lancets sent to [facility] in [place] number is [phone]

## 2017-11-12 ENCOUNTER — Other Ambulatory Visit: Payer: Self-pay

## 2017-11-12 MED ORDER — ONETOUCH ULTRASOFT LANCETS MISC: 12 refills | Status: AC

## 2017-11-12 NOTE — Telephone Encounter (Signed)
Rx send

## 2017-12-02 ENCOUNTER — Other Ambulatory Visit: Payer: Self-pay | Admitting: Family Medicine

## 2017-12-02 ENCOUNTER — Encounter: Payer: Self-pay | Admitting: Family Medicine

## 2017-12-02 ENCOUNTER — Ambulatory Visit: Payer: BC Managed Care – PPO | Admitting: Family Medicine

## 2017-12-02 VITALS — BP 120/66 | HR 67 | Temp 98.0°F | Resp 16 | Ht 68.0 in | Wt 245.0 lb

## 2017-12-02 DIAGNOSIS — J3089 Other allergic rhinitis: Secondary | ICD-10-CM | POA: Diagnosis not present

## 2017-12-02 DIAGNOSIS — G35 Multiple sclerosis: Secondary | ICD-10-CM

## 2017-12-02 DIAGNOSIS — E1169 Type 2 diabetes mellitus with other specified complication: Secondary | ICD-10-CM | POA: Diagnosis not present

## 2017-12-02 DIAGNOSIS — Z794 Long term (current) use of insulin: Secondary | ICD-10-CM

## 2017-12-02 DIAGNOSIS — Z0000 Encounter for general adult medical examination without abnormal findings: Secondary | ICD-10-CM

## 2017-12-02 DIAGNOSIS — E118 Type 2 diabetes mellitus with unspecified complications: Secondary | ICD-10-CM

## 2017-12-02 DIAGNOSIS — B360 Pityriasis versicolor: Secondary | ICD-10-CM

## 2017-12-02 DIAGNOSIS — E785 Hyperlipidemia, unspecified: Secondary | ICD-10-CM

## 2017-12-02 LAB — POCT GLYCOSYLATED HEMOGLOBIN (HGB A1C): HEMOGLOBIN A1C: 7.2 % — AB (ref 4.0–5.6)

## 2017-12-02 MED ORDER — SEMAGLUTIDE (1 MG/DOSE) 2 MG/1.5ML ~~LOC~~ SOPN: 1.0000 mg | PEN_INJECTOR | SUBCUTANEOUS | 3 refills | Status: DC

## 2017-12-02 MED ORDER — KETOCONAZOLE 2 % EX SHAM: 1.0000 "application " | MEDICATED_SHAMPOO | Freq: Every day | CUTANEOUS | 2 refills | Status: DC | PRN

## 2017-12-02 NOTE — Progress Notes (Signed)
Subjective:    Patient ID: Miguel [name], male    DOB: 1971/04/23, 47 y.o.   MRN: [id number]  Miguel [name] is a 47 y.o. male presenting on 12/02/2017 for Diabetes (obtw cough onset 2 weeks)   HPI   Sinusitis Reports symptoms started about 1-2 weeks ago with sore scratchy throat and sinus drainage. Now seems to be resolving with time, he feels better now still some residual drainage. - Taking NyQuil at night with some mild relief. Also taking Flonase 2 sprays on each side for 2 weeks - without significant relief - Difficulty sleeping at night due to the congestion - Admits one day of low grade fever. Admits some additional cough and thicker congestion - Denies any active fever, chills, sweats, nausea vomiting  FOLLOW-UPCHRONIC DM, Type 2: - Last visit with 08/28/17, for same problem, see prior notes for background information. - Interval update he was newly started on GLP1 at that visit on Ozempic titrate to dose 0.5mg  weekly - Today reports overall doing well. He thought A1c would be lower. Today measured at 7.2. Previous 7.5. - CBG log: avg 140-160 AM fasting. High < 200. Without Hypoglycemia. Meds: - Ozempic 0.5mg  [place] weekly - Basaglar 8 to 10u nightly (unchanged since last visit) - he was as low as 6 units but has to go up to about 20 units on the day he receives steroids for MS - Remains off Metformin 1000mg  IR Currently on ACEi Lifestyle: - Diet (Still improving DM diet) - Exercise (Limited exercise still but plans to improve) - Had some weight loss previously, then some wt gain +3-5 lbs Denies hypoglycemia, polyuria, visual changes, numbness or tingling.  Tinea Versicolor Rash Reports prior history of very similar rash >2 years ago saw Dermatology was given a Ketoconazole shampoo, it worked very well, has a heat rash in summer few patches on chest. Seems to have returned, now running out of shampoo needs new rx Admits some mild itching Denies pain  Depression  screen [facility] 2/9 12/02/2017 08/28/2017 05/26/2017  Decreased Interest 0 0 0  Down, Depressed, Hopeless 0 0 0  PHQ - 2 Score 0 0 0    Social History   Tobacco Use  . Smoking status: Never Smoker  . Smokeless tobacco: Never Used  Substance Use Topics  . Alcohol use: No  . Drug use: No    Review of Systems Per HPI unless specifically indicated above     Objective:    BP 120/66   Pulse 67   Temp 98 F (36.7 C) (Oral)   Resp 16   Ht 5\' 8"  (1.727 m)   Wt 245 lb (111.1 kg)   BMI 37.25 kg/m   Wt Readings from Last 3 Encounters:  12/02/17 245 lb (111.1 kg)  08/28/17 242 lb (109.8 kg)  05/26/17 240 lb (108.9 kg)    Physical Exam  Constitutional: He is oriented to person, place, and time. He appears well-developed and well-nourished. No distress.  Well-appearing, comfortable, cooperative  HENT:  Head: Normocephalic and atraumatic.  Mouth/Throat: Oropharynx is clear and moist.  Eyes: Conjunctivae are normal. Right eye exhibits no discharge. Left eye exhibits no discharge.  Neck: Normal range of motion. Neck supple. No thyromegaly present.  Cardiovascular: Normal rate, regular rhythm, normal heart sounds and intact distal pulses.  No murmur heard. Pulmonary/Chest: Effort normal and breath sounds normal. No respiratory distress. He has no wheezes. He has no rales.  Musculoskeletal: Normal range of motion. He exhibits no  edema.  Lymphadenopathy:    He has no cervical adenopathy.  Neurological: He is alert and oriented to person, place, and time.  Skin: Skin is warm and dry. Rash (slightly pigmented discolored splotchy rash on chest wall, consistent with tinea versicolor) noted. He is not diaphoretic. No erythema.  Psychiatric: He has a normal mood and affect. His behavior is normal.  Well groomed, good eye contact, normal speech and thoughts  Nursing note and vitals reviewed.    Diabetic Foot Exam - Simple   Simple Foot Form Diabetic Foot exam was performed with the following  findings:  Yes 12/02/2017  8:30 AM  Visual Inspection See comments:  Yes Sensation Testing Intact to touch and monofilament testing bilaterally:  Yes Pulse Check Posterior Tibialis and Dorsalis pulse intact bilaterally:  Yes Comments Mild callus formation bilateral great toes medially, and slight callus on heels. Otherwise no significant foot deformity or problem, some dry skin. Intact monofilament.     Recent Labs    05/26/17 0828 08/28/17 0835 12/02/17 0819  HGBA1C 7.4* 7.5* 7.2*    Results for orders placed or performed in visit on 12/02/17  POCT HgB A1C  Result Value Ref Range   Hemoglobin A1C 7.2 (A) 4.0 - 5.6 %   HbA1c POC (<> result, manual entry)  4.0 - 5.6 %   HbA1c, POC (prediabetic range)  5.7 - 6.4 %   HbA1c, POC (controlled diabetic range)  0.0 - 7.0 %      Assessment & Plan:   Problem List Items Addressed This Visit    Type 2 diabetes mellitus with other specified complication ([place]) - Primary    Improved on new GLP1 - A1c now to 7.2 from prior 7.5. Less hyperglycemia but still has occasional. Still receives steroids from Neurology for MS with his q 6 month med dosing No evidence of hypoglycemia on GLP and Insulin Failed Metformin 1000mg  BID (GI intolerance), XR was ineffective Complications - other including dyslipidemia with low HDL and history of hypertriglyceridemia, obesity, OSA - increases risk of future cardiovascular complications / poor glucose control due to OSA  Plan:  1. Reviewed options - given A1c still >7 and risk of future steroids with MS, agree to continue titrate dose of GLP1 with goal to come completely off Basaglar daily insulin and then he may use insulin as a PRN only on days of steroid infusion. - INCREASE Ozempic from 0.5mg  to 1mg  weekly [place] - new rx sent - Continue on Basaglar < 10 units, goal to titrate off completely pending fasting AM cbg as instructed - Remain off Metformin 2. Encourage improved lifestyle - low carb, low sugar diet,  reduce portion size, continue improving regular exercise 3. Check CBG, bring log to next visit for review again 4. Continue Statin 5. Restart Lisinopril 2.5 6. Follow-up 3 months - annual and labs A1c      Relevant Medications   Semaglutide (OZEMPIC) 1 MG/DOSE SOPN   Other Relevant Orders   POCT HgB A1C (Completed)    Other Visit Diagnoses    Tinea versicolor       Consistent on exam. Will refill prior Ketoconazole shampoo rx from Dermatology for same problem   Relevant Medications   ketoconazole (NIZORAL) 2 % shampoo   Seasonal allergic rhinitis due to other allergic trigger       Stable to improved now Cont Flonase, mucinex without DM to clear thicker congestion, improve hydration, no indication for abx at this time. f/u if not improve  Meds ordered this encounter  Medications  . Semaglutide (OZEMPIC) 1 MG/DOSE SOPN    Sig: Inject 1 mg into the skin once a week.    Dispense:  6 pen    Refill:  3    Discontinue Ozempic 0.5mg  dose. Increased dose to 1mg . Dispense 3 month supply or 6 pens if covered and approved  . ketoconazole (NIZORAL) 2 % shampoo    Sig: Apply 1 application topically daily as needed.    Dispense:  120 mL    Refill:  2    Follow up plan: Return in about 3 months (around 03/04/2018) for Annual Physical (DM f/u).  Future labs ordered for 03/10/18  Nobie Putnam, [facility] 12/02/2017, 11:46 AM

## 2017-12-02 NOTE — Assessment & Plan Note (Addendum)
Improved on new GLP1 - A1c now to 7.2 from prior 7.5. Less hyperglycemia but still has occasional. Still receives steroids from Neurology for MS with his q 6 month med dosing No evidence of hypoglycemia on GLP and Insulin Failed Metformin 1000mg  BID (GI intolerance), XR was ineffective Complications - other including dyslipidemia with low HDL and history of hypertriglyceridemia, obesity, OSA - increases risk of future cardiovascular complications / poor glucose control due to OSA  Plan:  1. Reviewed options - given A1c still >7 and risk of future steroids with MS, agree to continue titrate dose of GLP1 with goal to come completely off Basaglar daily insulin and then he may use insulin as a PRN only on days of steroid infusion. - INCREASE Ozempic from 0.5mg  to 1mg  weekly [place] - new rx sent - Continue on Basaglar < 10 units, goal to titrate off completely pending fasting AM cbg as instructed - Remain off Metformin 2. Encourage improved lifestyle - low carb, low sugar diet, reduce portion size, continue improving regular exercise 3. Check CBG, bring log to next visit for review again 4. Continue Statin 5. Restart Lisinopril 2.5 6. Follow-up 3 months - annual and labs A1c

## 2017-12-02 NOTE — Patient Instructions (Addendum)
Thank you for coming to the office today.  Recommend trial of OTC Mucinex (plain without DM) for up to twice a day for 1 week if needed.  Continue Flonase.  Finish Ozempic 0.5mg  dose this week.  Increase dose now on Ozempic up to 1mg  weekly - new rx sent - for 3 month supply  May continue Basaglar and keep titrating down as you are. Eventually may come off completely and then only use a single higher dose in future with bag steroids.  Recommend to resume Lisinopril 2.5mg  daily - should not lower BP significantly, it is to help kidney protection.  DUE for FASTING BLOOD WORK (no food or drink after midnight before the lab appointment, only water or coffee without cream/sugar on the morning of)  SCHEDULE "Lab Only" visit in the morning at the clinic for lab draw in 3 MONTHS   - Make sure Lab Only appointment is at about 1 week before your next appointment, so that results will be available  For Lab Results, once available within 2-3 days of blood draw, you can can log in to MyChart online to view your results and a brief explanation. Also, we can discuss results at next follow-up visit.   Please schedule a Follow-up Appointment to: Return in about 3 months (around 03/04/2018) for Annual Physical (DM f/u).  If you have any other questions or concerns, please feel free to call the office or send a message through [place]. You may also schedule an earlier appointment if necessary.  Additionally, you may be receiving a survey about your experience at our office within a few days to 1 week by e-mail or mail. We value your feedback.  Nobie Putnam, DO [place]

## 2017-12-04 ENCOUNTER — Telehealth: Payer: Self-pay | Admitting: Family Medicine

## 2017-12-04 DIAGNOSIS — J019 Acute sinusitis, unspecified: Secondary | ICD-10-CM

## 2017-12-04 MED ORDER — AMOXICILLIN-POT CLAVULANATE 875-125 MG PO TABS: 1.0000 | ORAL_TABLET | Freq: Two times a day (BID) | ORAL | 0 refills | Status: DC

## 2017-12-04 NOTE — Telephone Encounter (Signed)
The pt was notified. No questions or concerns. 

## 2017-12-04 NOTE — Telephone Encounter (Signed)
We discussed this in office recently. Augmentin sent to [facility] 12/04/2017, 12:28 PM

## 2017-12-04 NOTE — Telephone Encounter (Signed)
When pt was in office earlier this week, he thought he was feeling better but the drainage and cough are still hanging on.  He has an infusion on Aug 7th and asked if he could get an antibiotic sent to [facility] [phone]

## 2018-01-02 ENCOUNTER — Other Ambulatory Visit: Payer: Self-pay | Admitting: Family Medicine

## 2018-01-02 DIAGNOSIS — Z794 Long term (current) use of insulin: Principal | ICD-10-CM

## 2018-01-02 DIAGNOSIS — E119 Type 2 diabetes mellitus without complications: Secondary | ICD-10-CM

## 2018-02-17 ENCOUNTER — Other Ambulatory Visit: Payer: Self-pay | Admitting: Family Medicine

## 2018-02-17 DIAGNOSIS — E0800 Diabetes mellitus due to underlying condition with hyperosmolarity without nonketotic hyperglycemic-hyperosmolar coma (NKHHC): Secondary | ICD-10-CM

## 2018-02-17 DIAGNOSIS — E783 Hyperchylomicronemia: Secondary | ICD-10-CM

## 2018-02-17 DIAGNOSIS — Z794 Long term (current) use of insulin: Principal | ICD-10-CM

## 2018-02-17 MED ORDER — LISINOPRIL 2.5 MG PO TABS: 2.5000 mg | ORAL_TABLET | Freq: Every day | ORAL | 0 refills | Status: DC

## 2018-02-17 MED ORDER — ATORVASTATIN CALCIUM 20 MG PO TABS: 20.0000 mg | ORAL_TABLET | Freq: Every day | ORAL | 0 refills | Status: DC

## 2018-02-17 NOTE — Telephone Encounter (Signed)
Pt needs refills on lisinopril and atorvastatin sent to [facility].

## 2018-03-03 ENCOUNTER — Other Ambulatory Visit: Payer: Self-pay | Admitting: Family Medicine

## 2018-03-03 DIAGNOSIS — E1169 Type 2 diabetes mellitus with other specified complication: Secondary | ICD-10-CM

## 2018-03-03 DIAGNOSIS — E118 Type 2 diabetes mellitus with unspecified complications: Secondary | ICD-10-CM

## 2018-03-03 DIAGNOSIS — E783 Hyperchylomicronemia: Secondary | ICD-10-CM

## 2018-03-03 DIAGNOSIS — E119 Type 2 diabetes mellitus without complications: Secondary | ICD-10-CM

## 2018-03-03 DIAGNOSIS — Z794 Long term (current) use of insulin: Secondary | ICD-10-CM

## 2018-03-04 MED ORDER — SEMAGLUTIDE (1 MG/DOSE) 2 MG/1.5ML ~~LOC~~ SOPN: 1.0000 mg | PEN_INJECTOR | SUBCUTANEOUS | 3 refills | Status: DC

## 2018-03-10 ENCOUNTER — Other Ambulatory Visit: Payer: BC Managed Care – PPO

## 2018-03-15 ENCOUNTER — Other Ambulatory Visit: Payer: BC Managed Care – PPO

## 2018-03-15 DIAGNOSIS — G35 Multiple sclerosis: Secondary | ICD-10-CM

## 2018-03-15 DIAGNOSIS — E785 Hyperlipidemia, unspecified: Secondary | ICD-10-CM

## 2018-03-15 DIAGNOSIS — Z794 Long term (current) use of insulin: Secondary | ICD-10-CM

## 2018-03-15 DIAGNOSIS — E118 Type 2 diabetes mellitus with unspecified complications: Secondary | ICD-10-CM

## 2018-03-15 DIAGNOSIS — E1169 Type 2 diabetes mellitus with other specified complication: Secondary | ICD-10-CM

## 2018-03-15 DIAGNOSIS — Z0000 Encounter for general adult medical examination without abnormal findings: Secondary | ICD-10-CM

## 2018-03-16 LAB — CBC WITH DIFFERENTIAL/PLATELET
BASOS PCT: 0.2 %
Basophils Absolute: 11 cells/uL (ref 0–200)
EOS ABS: 90 {cells}/uL (ref 15–500)
Eosinophils Relative: 1.6 %
HEMATOCRIT: 44.5 % (ref 38.5–50.0)
Hemoglobin: 15.5 g/dL (ref 13.2–17.1)
Lymphs Abs: 1210 cells/uL (ref 850–3900)
MCH: 32 pg (ref 27.0–33.0)
MCHC: 34.8 g/dL (ref 32.0–36.0)
MCV: 91.8 fL (ref 80.0–100.0)
MPV: 10.6 fL (ref 7.5–12.5)
Monocytes Relative: 7.2 %
NEUTROS ABS: 3886 {cells}/uL (ref 1500–7800)
Neutrophils Relative %: 69.4 %
Platelets: 182 10*3/uL (ref 140–400)
RBC: 4.85 10*6/uL (ref 4.20–5.80)
RDW: 12 % (ref 11.0–15.0)
Total Lymphocyte: 21.6 %
WBC: 5.6 10*3/uL (ref 3.8–10.8)
WBCMIX: 403 {cells}/uL (ref 200–950)

## 2018-03-16 LAB — LIPID PANEL
CHOL/HDL RATIO: 5.1 (calc) — AB (ref <5.0)
CHOLESTEROL: 153 mg/dL (ref <200)
HDL: 30 mg/dL — AB (ref >40)
LDL Cholesterol (Calc): 97 mg/dL (calc)
Non-HDL Cholesterol (Calc): 123 mg/dL (calc) (ref <130)
Triglycerides: 155 mg/dL — ABNORMAL HIGH (ref <150)

## 2018-03-16 LAB — COMPLETE METABOLIC PANEL WITH GFR
AG Ratio: 2 (calc) (ref 1.0–2.5)
ALT: 20 U/L (ref 9–46)
AST: 14 U/L (ref 10–40)
Albumin: 4.3 g/dL (ref 3.6–5.1)
Alkaline phosphatase (APISO): 74 U/L (ref 40–115)
BILIRUBIN TOTAL: 1 mg/dL (ref 0.2–1.2)
BUN: 15 mg/dL (ref 7–25)
CHLORIDE: 104 mmol/L (ref 98–110)
CO2: 29 mmol/L (ref 20–32)
Calcium: 9.4 mg/dL (ref 8.6–10.3)
Creat: 0.72 mg/dL (ref 0.60–1.35)
GFR, Est African American: 130 mL/min/{1.73_m2} (ref >=60)
GFR, Est Non African American: 112 mL/min/{1.73_m2} (ref >=60)
GLOBULIN: 2.1 g/dL (ref 1.9–3.7)
Glucose, Bld: 147 mg/dL — ABNORMAL HIGH (ref 65–139)
Potassium: 4 mmol/L (ref 3.5–5.3)
SODIUM: 141 mmol/L (ref 135–146)
Total Protein: 6.4 g/dL (ref 6.1–8.1)

## 2018-03-16 LAB — HEMOGLOBIN A1C
Hgb A1c MFr Bld: 6.9 % of total Hgb — ABNORMAL HIGH (ref <5.7)
Mean Plasma Glucose: 151 (calc)
eAG (mmol/L): 8.4 (calc)

## 2018-03-17 ENCOUNTER — Encounter: Payer: Self-pay | Admitting: Family Medicine

## 2018-03-17 ENCOUNTER — Ambulatory Visit (INDEPENDENT_AMBULATORY_CARE_PROVIDER_SITE_OTHER): Payer: BC Managed Care – PPO | Admitting: Family Medicine

## 2018-03-17 VITALS — BP 130/80 | HR 70 | Temp 98.7°F | Resp 16 | Ht 68.0 in | Wt 250.0 lb

## 2018-03-17 DIAGNOSIS — E1169 Type 2 diabetes mellitus with other specified complication: Secondary | ICD-10-CM | POA: Diagnosis not present

## 2018-03-17 DIAGNOSIS — M792 Neuralgia and neuritis, unspecified: Secondary | ICD-10-CM | POA: Diagnosis not present

## 2018-03-17 DIAGNOSIS — G35 Multiple sclerosis: Secondary | ICD-10-CM

## 2018-03-17 DIAGNOSIS — Z23 Encounter for immunization: Secondary | ICD-10-CM | POA: Diagnosis not present

## 2018-03-17 DIAGNOSIS — Z0000 Encounter for general adult medical examination without abnormal findings: Secondary | ICD-10-CM

## 2018-03-17 DIAGNOSIS — E785 Hyperlipidemia, unspecified: Secondary | ICD-10-CM

## 2018-03-17 DIAGNOSIS — Z794 Long term (current) use of insulin: Secondary | ICD-10-CM

## 2018-03-17 NOTE — Assessment & Plan Note (Signed)
Still significantly improved on higher dose GLP1 Ozempic at A1c 6.9 now from prior mid 7s Rare hyperglycemia On treatment for MS. Rare steroid infusion for MS flare. No evidence of hypoglycemia on GLP and Insulin Failed Metformin 1000mg  BID (GI intolerance), XR was ineffective Complications - other including dyslipidemia with low HDL and history of hypertriglyceridemia, obesity, OSA - increases risk of future cardiovascular complications / poor glucose control due to OSA  Plan:  1. Continue current max dose Ozempic 1mg  weekly [place] - (suspect he feels 0.5 did better because it was new medicine, likely slight diminishing return on higher dose) - Continue on Basaglar - now at 14 u - titrate down as advised, if again < 6 units then we can reconsider DC and start SGLT2 in future OR may consider just DC and remain monotherapy GLP1 2. Encourage improved lifestyle - low carb, low sugar diet, reduce portion size, continue improving regular exercise 3. Check CBG, bring log to next visit for review again 4. Continue Statin, ACE 5. Follow-up 6 months DM A1c

## 2018-03-17 NOTE — Patient Instructions (Addendum)
Thank you for coming to the office today.  A1c 6.9, if you can keep tapering down Basaglar insulin from 14 down to 12 u in future if avg glucose is < 150. Continue taper down as planned.  If you get down to 6 or less insulin we can discuss next time swap insulin - for Farxiga or Jardiance - these are newer medicine urinate out sugar, but risk would be with peripheral vascular or nerve symptoms if you developed new concerns would.  Pneumonia vaccine today Pneumovax-23 today then done   Keep working on regular exercise plan.  Please schedule a Follow-up Appointment to: Return in about 6 months (around 09/16/2018) for DM A1c - med adjust.  If you have any other questions or concerns, please feel free to call the office or send a message through [place]. You may also schedule an earlier appointment if necessary.  Additionally, you may be receiving a survey about your experience at our office within a few days to 1 week by e-mail or mail. We value your feedback.  Nobie Putnam, DO [place]

## 2018-03-17 NOTE — Assessment & Plan Note (Signed)
Stable without recent exacerbation Followed by [facility] Continue current therapy as directed

## 2018-03-17 NOTE — Assessment & Plan Note (Signed)
Mostly controlled cholesterol on statin and improved lifestyle - still slight low HDL Last lipid panel 02/2018 Calculated ASCVD 10 yr risk score 3.4%, but diabetic  Plan: 1. Continue current meds - Atorvastatin 20mg  daily 2. Continue ASA 81mg  for primary ASCVD risk reduction 3. Encourage improved lifestyle - low carb/cholesterol, reduce portion size, continue improving regular exercise 4. Follow-up lipids yearly

## 2018-03-17 NOTE — Assessment & Plan Note (Signed)
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

## 2018-03-17 NOTE — Progress Notes (Signed)
Subjective:    Patient ID: Miguel [name], male    DOB: May 06, 1971, 47 y.o.   MRN: [id number]  Miguel [name] is a 47 y.o. male presenting on 03/17/2018 for Annual Exam   HPI   Here for Annual Physical and Lab Review  FOLLOW-UPCHRONIC DM, Type 2: - Last visit with 12/02/17, for same problem, see prior notes for background information. - Interval updateincreased on GLP1 ozempic from 0.5 up to 1mg  - Today reports overall doing well. He thought A1c would be lower again - now A1c down to 6.9 previously >7 Admits increased life stressors - Missed pain injection, aunt passed, bought a property affecting his sugar  - CBG log: avg  In past 170-180 now back improved to avg 140-160 AM fasting. High < 200. Without Hypoglycemia. Meds- - Ozempic 1mg  [place] weekly - admits some constipation related - Basaglar up to 14u again (was down as low as 8u) nightly-  but has to go up to about 20 units on the day he receives steroids for MS and continues for few days Currently on ACEi Lifestyle: - Diet (Still improving DM diet - trying to maintain improve) - Exercise (Limited exercisemore recently) - Had some weight loss previously, now recently wt gain +5 lbs in past 3-6 months - Scheduled for DM [facility] 05/13/18 Denies hypoglycemia  Chronic Neuropathic Pain / Back Pain / Multiple Sclerosis Followed by [facility]. See outside record for details. Continues on disease modifying therapy infusion every few months, rarely has flare up requiring bag steroid infusion. - Taking Gabapentin, Baclofen, Tramadol - temporary relief - Has been receiving spinal injections as well had missed last one  HYPERLIPIDEMIA: - Reports no concerns. Last lipid panel 02/2018, stable from previous, controlled LDL - Currently taking Atorvastatin 20mg  daily, tolerating well without side effects or myalgias  Health Maintenance: Check status of prior TDap in other record.  Due for pneumonia vaccine Pneumovax-23  1st dose prior to age 79, with DM / MS   Depression screen [facility] 2/9 03/17/2018 12/02/2017 08/28/2017  Decreased Interest 0 0 0  Down, Depressed, Hopeless 0 0 0  PHQ - 2 Score 0 0 0    Past Medical History:  Diagnosis Date  . Allergy   . Sleep apnea    Past Surgical History:  Procedure Laterality Date  . HERNIA REPAIR     Social History   Socioeconomic History  . Marital status: Married    Spouse name: Not on file  . Number of children: Not on file  . Years of education: Not on file  . Highest education level: Not on file  Occupational History  . Not on file  Social Needs  . Financial resource strain: Not on file  . Food insecurity:    Worry: Not on file    Inability: Not on file  . Transportation needs:    Medical: Not on file    Non-medical: Not on file  Tobacco Use  . Smoking status: Never Smoker  . Smokeless tobacco: Never Used  Substance and Sexual Activity  . Alcohol use: No  . Drug use: No  . Sexual activity: Not on file  Lifestyle  . Physical activity:    Days per week: Not on file    Minutes per session: Not on file  . Stress: Not on file  Relationships  . Social connections:    Talks on phone: Not on file    Gets together: Not on file  Attends religious service: Not on file    Active member of club or organization: Not on file    Attends meetings of clubs or organizations: Not on file    Relationship status: Not on file  . Intimate partner violence:    Fear of current or ex partner: Not on file    Emotionally abused: Not on file    Physically abused: Not on file    Forced sexual activity: Not on file  Other Topics Concern  . Not on file  Social History Narrative  . Not on file   Family History  Problem Relation Age of Onset  . Colon cancer Neg Hx   . Prostate cancer Neg Hx    Current Outpatient Medications on File Prior to Visit  Medication Sig  . atorvastatin (LIPITOR) 20 MG tablet TAKE 1 TABLET(20 MG) BY MOUTH DAILY  . B-D UF III  MINI PEN NEEDLES 31G X 5 MM MISC USE AS DIRECTED ONCE A DAY  . baclofen (LIORESAL) 10 MG tablet TK 1 T PO  TID PRN FOR SPASMS  . Cholecalciferol (D 1000) 1000 units capsule Take by mouth.   . Cyanocobalamin (VITAMIN B-12) 5000 MCG SUBL Place under the tongue.  Marland Kitchen desonide (DESOWEN) 0.05 % cream desonide 0.05 % topical cream  . gabapentin (NEURONTIN) 300 MG capsule Take 300 mg by mouth continuous as needed.   Marland Kitchen glucose blood (ONE TOUCH ULTRA TEST) test strip USE AS DIRECTED TO CHECK BLOOD SUGAR THREE TIMES DAILY  . ibuprofen (ADVIL,MOTRIN) 800 MG tablet Take 800 mg by mouth every 6 (six) hours as needed.   . Insulin Glargine (BASAGLAR KWIKPEN) 100 UNIT/ML SOPN INJECT 14 UNITS( 0.14 ML) UNDER THE SKIN AT BEDTIME  . interferon beta-1a (AVONEX PREFILLED) 30 MCG/0.5ML PSKT injection Frequency:1XW   Dosage:0.0     Instructions:  Note:Dose: 30MCG/0.5ML  . ketoconazole (NIZORAL) 2 % shampoo Apply 1 application topically daily as needed.  . Lancets (ONETOUCH ULTRASOFT) lancets Check blood sugar three times daily.  Marland Kitchen lisinopril (ZESTRIL) 2.5 MG tablet Take 1 tablet (2.5 mg total) by mouth daily.  Wess Botts (OCREVUS IV) Inject into the vein.  Marland Kitchen oxybutynin (DITROPAN) 5 MG tablet Take 5 mg by mouth.  . oxyCODONE-acetaminophen (PERCOCET/ROXICET) 5-325 MG tablet Take by mouth.  . Semaglutide, 1 MG/DOSE, (OZEMPIC, 1 MG/DOSE,) 2 MG/1.5ML SOPN Inject 1 mg into the skin once a week.  . tamsulosin (FLOMAX) 0.4 MG CAPS capsule Take 0.4 mg by mouth.   No current facility-administered medications on file prior to visit.     Review of Systems  Constitutional: Negative for activity change, appetite change, chills, diaphoresis, fatigue and fever.  HENT: Negative for congestion and hearing loss.   Eyes: Negative for visual disturbance.  Respiratory: Negative for apnea, cough, chest tightness, shortness of breath and wheezing.   Cardiovascular: Negative for chest pain, palpitations and leg swelling.    Gastrointestinal: Negative for abdominal pain, anal bleeding, blood in stool, constipation, diarrhea, nausea and vomiting.  Endocrine: Negative for cold intolerance.  Genitourinary: Negative for decreased urine volume, difficulty urinating, dysuria, frequency, hematuria and urgency.  Musculoskeletal: Negative for arthralgias, back pain and neck pain.  Skin: Negative for rash.  Allergic/Immunologic: Negative for environmental allergies.  Neurological: Negative for dizziness, weakness, light-headedness, numbness and headaches.  Hematological: Negative for adenopathy.  Psychiatric/Behavioral: Negative for behavioral problems, dysphoric mood and sleep disturbance. The patient is not nervous/anxious.    Per HPI unless specifically indicated above     Objective:  BP 130/80   Pulse 70   Temp 98.7 F (37.1 C) (Oral)   Resp 16   Ht 5\' 8"  (1.727 m)   Wt 250 lb (113.4 kg)   BMI 38.01 kg/m   Wt Readings from Last 3 Encounters:  03/17/18 250 lb (113.4 kg)  12/02/17 245 lb (111.1 kg)  08/28/17 242 lb (109.8 kg)    Physical Exam  Constitutional: He is oriented to person, place, and time. He appears well-developed and well-nourished. No distress.  Well-appearing, comfortable, cooperative  HENT:  Head: Normocephalic and atraumatic.  Mouth/Throat: Oropharynx is clear and moist.  Frontal / maxillary sinuses non-tender. Nares patent without purulence or edema. Bilateral TMs clear without erythema, effusion or bulging. Oropharynx clear without erythema, exudates, edema or asymmetry.  Eyes: Pupils are equal, round, and reactive to light. Conjunctivae and EOM are normal. Right eye exhibits no discharge. Left eye exhibits no discharge.  Neck: Normal range of motion. Neck supple. No thyromegaly present.  Cardiovascular: Normal rate, regular rhythm, normal heart sounds and intact distal pulses.  No murmur heard. Pulmonary/Chest: Effort normal and breath sounds normal. No respiratory distress. He  has no wheezes. He has no rales.  Abdominal: Soft. Bowel sounds are normal. He exhibits no distension and no mass. There is no tenderness.  Musculoskeletal: Normal range of motion. He exhibits no edema or tenderness.  Upper / Lower Extremities: - Normal muscle tone, strength bilateral upper extremities 5/5, lower extremities 5/5  Lymphadenopathy:    He has no cervical adenopathy.  Neurological: He is alert and oriented to person, place, and time.  Distal sensation intact to light touch all extremities  Skin: Skin is warm and dry. No rash noted. He is not diaphoretic. No erythema.  Psychiatric: He has a normal mood and affect. His behavior is normal.  Well groomed, good eye contact, normal speech and thoughts  Nursing note and vitals reviewed.  Results for orders placed or performed in visit on 03/15/18  Lipid panel  Result Value Ref Range   Cholesterol 153 <200 mg/dL   HDL 30 (L) >40 mg/dL   Triglycerides 155 (H) <150 mg/dL   LDL Cholesterol (Calc) 97 mg/dL (calc)   Total CHOL/HDL Ratio 5.1 (H) <5.0 (calc)   Non-HDL Cholesterol (Calc) 123 <130 mg/dL (calc)  COMPLETE METABOLIC PANEL WITH GFR  Result Value Ref Range   Glucose, Bld 147 (H) 65 - 139 mg/dL   BUN 15 7 - 25 mg/dL   Creat 0.72 0.60 - 1.35 mg/dL   GFR, Est Non African American 112 > OR = 60 mL/min/1.68m2   GFR, Est African American 130 > OR = 60 mL/min/1.39m2   BUN/Creatinine Ratio NOT APPLICABLE 6 - 22 (calc)   Sodium 141 135 - 146 mmol/L   Potassium 4.0 3.5 - 5.3 mmol/L   Chloride 104 98 - 110 mmol/L   CO2 29 20 - 32 mmol/L   Calcium 9.4 8.6 - 10.3 mg/dL   Total Protein 6.4 6.1 - 8.1 g/dL   Albumin 4.3 3.6 - 5.1 g/dL   Globulin 2.1 1.9 - 3.7 g/dL (calc)   AG Ratio 2.0 1.0 - 2.5 (calc)   Total Bilirubin 1.0 0.2 - 1.2 mg/dL   Alkaline phosphatase (APISO) 74 40 - 115 U/L   AST 14 10 - 40 U/L   ALT 20 9 - 46 U/L  CBC with Differential/Platelet  Result Value Ref Range   WBC 5.6 3.8 - 10.8 Thousand/uL   RBC 4.85  4.20 - 5.80 Million/uL  Hemoglobin 15.5 13.2 - 17.1 g/dL   HCT 44.5 38.5 - 50.0 %   MCV 91.8 80.0 - 100.0 fL   MCH 32.0 27.0 - 33.0 pg   MCHC 34.8 32.0 - 36.0 g/dL   RDW 12.0 11.0 - 15.0 %   Platelets 182 140 - 400 Thousand/uL   MPV 10.6 7.5 - 12.5 fL   Neutro Abs 3,886 1,500 - 7,800 cells/uL   Lymphs Abs 1,210 850 - 3,900 cells/uL   WBC mixed population 403 200 - 950 cells/uL   Eosinophils Absolute 90 15 - 500 cells/uL   Basophils Absolute 11 0 - 200 cells/uL   Neutrophils Relative % 69.4 %   Total Lymphocyte 21.6 %   Monocytes Relative 7.2 %   Eosinophils Relative 1.6 %   Basophils Relative 0.2 %  Hemoglobin A1c  Result Value Ref Range   Hgb A1c MFr Bld 6.9 (H) <5.7 % of total Hgb   Mean Plasma Glucose 151 (calc)   eAG (mmol/L) 8.4 (calc)      Assessment & Plan:   Problem List Items Addressed This Visit    Hyperlipidemia associated with type 2 diabetes mellitus ([place])    Mostly controlled cholesterol on statin and improved lifestyle - still slight low HDL Last lipid panel 02/2018 Calculated ASCVD 10 yr risk score 3.4%, but diabetic  Plan: 1. Continue current meds - Atorvastatin 20mg  daily 2. Continue ASA 81mg  for primary ASCVD risk reduction 3. Encourage improved lifestyle - low carb/cholesterol, reduce portion size, continue improving regular exercise 4. 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 Rare steroid infusion for MS flare. No evidence of hypoglycemia on GLP and Insulin Failed  Metformin 1000mg  BID (GI intolerance), XR was ineffective Complications - other including dyslipidemia with low HDL and history of hypertriglyceridemia, obesity, OSA - increases risk of future cardiovascular complications / poor glucose control due to OSA  Plan:  1. Continue current max dose Ozempic 1mg  weekly [place] - (suspect he feels 0.5 did better because it was new medicine, likely slight diminishing return on higher dose) - Continue on Basaglar - now at 14 u - titrate down as advised, if again < 6 units then we can reconsider DC and start SGLT2 in future OR may consider just DC and remain monotherapy GLP1 2. Encourage improved lifestyle - low carb, low sugar diet, reduce portion size, continue improving regular exercise 3. Check CBG, bring log to next visit for review again 4. Continue Statin, ACE 5. Follow-up 6 months DM A1c       Other Visit Diagnoses    Annual physical exam    -  Primary   Need for 23-polyvalent pneumococcal polysaccharide vaccine       Relevant Orders   Pneumococcal polysaccharide vaccine 23-valent greater than or equal to 2yo subcutaneous/IM (Completed)      Updated Health Maintenance information - Pneumovax23 initial dose indicated for DM, next due age 67 - Check TDap record Reviewed recent lab results with patient Encouraged  improvement to lifestyle with diet and exercise - Goal of weight loss   No orders of the defined types were placed in this encounter.   Follow up plan: Return in about 6 months (around 09/16/2018) for DM A1c - med adjust.  Nobie Putnam, DO [facility] 03/17/2018, 12:36 PM

## 2018-05-13 LAB — HM DIABETES EYE EXAM

## 2018-05-17 ENCOUNTER — Encounter: Payer: Self-pay | Admitting: Family Medicine

## 2018-07-01 ENCOUNTER — Other Ambulatory Visit: Payer: Self-pay | Admitting: Nurse Practitioner

## 2018-07-01 DIAGNOSIS — E783 Hyperchylomicronemia: Secondary | ICD-10-CM

## 2018-07-01 DIAGNOSIS — Z794 Long term (current) use of insulin: Secondary | ICD-10-CM

## 2018-07-01 DIAGNOSIS — E0800 Diabetes mellitus due to underlying condition with hyperosmolarity without nonketotic hyperglycemic-hyperosmolar coma (NKHHC): Principal | ICD-10-CM

## 2018-09-12 ENCOUNTER — Other Ambulatory Visit: Payer: Self-pay | Admitting: Family Medicine

## 2018-09-12 DIAGNOSIS — Z794 Long term (current) use of insulin: Principal | ICD-10-CM

## 2018-09-12 DIAGNOSIS — E1169 Type 2 diabetes mellitus with other specified complication: Secondary | ICD-10-CM

## 2018-09-17 ENCOUNTER — Ambulatory Visit: Payer: BC Managed Care – PPO | Admitting: Family Medicine

## 2018-09-27 ENCOUNTER — Other Ambulatory Visit: Payer: Self-pay | Admitting: Nurse Practitioner

## 2018-09-27 DIAGNOSIS — E0800 Diabetes mellitus due to underlying condition with hyperosmolarity without nonketotic hyperglycemic-hyperosmolar coma (NKHHC): Secondary | ICD-10-CM

## 2018-10-22 ENCOUNTER — Other Ambulatory Visit: Payer: Self-pay | Admitting: Family Medicine

## 2018-10-22 DIAGNOSIS — E1169 Type 2 diabetes mellitus with other specified complication: Secondary | ICD-10-CM

## 2018-10-22 DIAGNOSIS — Z794 Long term (current) use of insulin: Secondary | ICD-10-CM

## 2018-11-20 ENCOUNTER — Other Ambulatory Visit: Payer: Self-pay | Admitting: Family Medicine

## 2018-11-20 DIAGNOSIS — E118 Type 2 diabetes mellitus with unspecified complications: Secondary | ICD-10-CM

## 2018-11-20 DIAGNOSIS — Z794 Long term (current) use of insulin: Secondary | ICD-10-CM

## 2019-01-02 ENCOUNTER — Other Ambulatory Visit: Payer: Self-pay | Admitting: Family Medicine

## 2019-01-02 DIAGNOSIS — E0800 Diabetes mellitus due to underlying condition with hyperosmolarity without nonketotic hyperglycemic-hyperosmolar coma (NKHHC): Secondary | ICD-10-CM

## 2019-01-02 DIAGNOSIS — E119 Type 2 diabetes mellitus without complications: Secondary | ICD-10-CM

## 2019-01-03 ENCOUNTER — Other Ambulatory Visit: Payer: Self-pay | Admitting: Nurse Practitioner

## 2019-01-03 DIAGNOSIS — E783 Hyperchylomicronemia: Secondary | ICD-10-CM

## 2019-03-15 ENCOUNTER — Other Ambulatory Visit: Payer: Self-pay

## 2019-03-15 ENCOUNTER — Other Ambulatory Visit: Payer: BC Managed Care – PPO

## 2019-03-15 ENCOUNTER — Telehealth: Payer: Self-pay

## 2019-03-15 DIAGNOSIS — Z0000 Encounter for general adult medical examination without abnormal findings: Secondary | ICD-10-CM

## 2019-03-15 DIAGNOSIS — Z794 Long term (current) use of insulin: Secondary | ICD-10-CM

## 2019-03-15 DIAGNOSIS — E1169 Type 2 diabetes mellitus with other specified complication: Secondary | ICD-10-CM

## 2019-03-15 DIAGNOSIS — E785 Hyperlipidemia, unspecified: Secondary | ICD-10-CM

## 2019-03-15 DIAGNOSIS — E118 Type 2 diabetes mellitus with unspecified complications: Secondary | ICD-10-CM

## 2019-03-15 NOTE — Telephone Encounter (Signed)
Labs placed for physical.

## 2019-03-16 LAB — COMPREHENSIVE METABOLIC PANEL
AG Ratio: 2.3 (calc) (ref 1.0–2.5)
ALT: 27 U/L (ref 9–46)
AST: 16 U/L (ref 10–40)
Albumin: 4.5 g/dL (ref 3.6–5.1)
Alkaline phosphatase (APISO): 83 U/L (ref 36–130)
BUN: 13 mg/dL (ref 7–25)
CO2: 26 mmol/L (ref 20–32)
Calcium: 9.6 mg/dL (ref 8.6–10.3)
Chloride: 103 mmol/L (ref 98–110)
Creat: 0.81 mg/dL (ref 0.60–1.35)
Globulin: 2 g/dL (calc) (ref 1.9–3.7)
Glucose, Bld: 175 mg/dL — ABNORMAL HIGH (ref 65–99)
Potassium: 4.1 mmol/L (ref 3.5–5.3)
Sodium: 139 mmol/L (ref 135–146)
Total Bilirubin: 0.9 mg/dL (ref 0.2–1.2)
Total Protein: 6.5 g/dL (ref 6.1–8.1)

## 2019-03-16 LAB — LIPID PANEL
Cholesterol: 149 mg/dL (ref <200)
HDL: 32 mg/dL — ABNORMAL LOW (ref >=40)
LDL Cholesterol (Calc): 96 mg/dL (calc)
Non-HDL Cholesterol (Calc): 117 mg/dL (calc) (ref <130)
Total CHOL/HDL Ratio: 4.7 (calc) (ref <5.0)
Triglycerides: 110 mg/dL (ref <150)

## 2019-03-16 LAB — CBC WITH DIFFERENTIAL/PLATELET
Absolute Monocytes: 439 cells/uL (ref 200–950)
Basophils Absolute: 11 cells/uL (ref 0–200)
Basophils Relative: 0.2 %
Eosinophils Absolute: 80 cells/uL (ref 15–500)
Eosinophils Relative: 1.4 %
HCT: 45.5 % (ref 38.5–50.0)
Hemoglobin: 15.7 g/dL (ref 13.2–17.1)
Lymphs Abs: 1106 cells/uL (ref 850–3900)
MCH: 31.8 pg (ref 27.0–33.0)
MCHC: 34.5 g/dL (ref 32.0–36.0)
MCV: 92.1 fL (ref 80.0–100.0)
MPV: 10.7 fL (ref 7.5–12.5)
Monocytes Relative: 7.7 %
Neutro Abs: 4064 cells/uL (ref 1500–7800)
Neutrophils Relative %: 71.3 %
Platelets: 199 10*3/uL (ref 140–400)
RBC: 4.94 10*6/uL (ref 4.20–5.80)
RDW: 11.8 % (ref 11.0–15.0)
Total Lymphocyte: 19.4 %
WBC: 5.7 10*3/uL (ref 3.8–10.8)

## 2019-03-16 LAB — HEMOGLOBIN A1C
Hgb A1c MFr Bld: 6.7 % of total Hgb — ABNORMAL HIGH (ref <5.7)
Mean Plasma Glucose: 146 (calc)
eAG (mmol/L): 8.1 (calc)

## 2019-03-18 NOTE — Telephone Encounter (Signed)
The pt was notified. No questions or concerns. 

## 2019-03-21 ENCOUNTER — Ambulatory Visit (INDEPENDENT_AMBULATORY_CARE_PROVIDER_SITE_OTHER): Payer: BC Managed Care – PPO | Admitting: Family Medicine

## 2019-03-21 ENCOUNTER — Other Ambulatory Visit: Payer: Self-pay | Admitting: Family Medicine

## 2019-03-21 ENCOUNTER — Ambulatory Visit
Admission: RE | Admit: 2019-03-21 | Discharge: 2019-03-21 | Disposition: A | Discharge status: Home / Self Care | Payer: BC Managed Care – PPO | Source: Ambulatory Visit | Attending: Chiropractic Medicine | Admitting: Chiropractic Medicine

## 2019-03-21 ENCOUNTER — Other Ambulatory Visit: Payer: Self-pay | Admitting: Chiropractic Medicine

## 2019-03-21 ENCOUNTER — Encounter: Payer: BC Managed Care – PPO | Admitting: Family Medicine

## 2019-03-21 ENCOUNTER — Encounter: Payer: Self-pay | Admitting: Family Medicine

## 2019-03-21 ENCOUNTER — Other Ambulatory Visit: Payer: Self-pay

## 2019-03-21 VITALS — BP 116/66 | HR 68 | Ht 68.0 in | Wt 238.8 lb

## 2019-03-21 DIAGNOSIS — Z794 Long term (current) use of insulin: Secondary | ICD-10-CM

## 2019-03-21 DIAGNOSIS — M5414 Radiculopathy, thoracic region: Secondary | ICD-10-CM | POA: Diagnosis present

## 2019-03-21 DIAGNOSIS — B360 Pityriasis versicolor: Secondary | ICD-10-CM

## 2019-03-21 DIAGNOSIS — Z0000 Encounter for general adult medical examination without abnormal findings: Secondary | ICD-10-CM | POA: Diagnosis not present

## 2019-03-21 DIAGNOSIS — E1169 Type 2 diabetes mellitus with other specified complication: Secondary | ICD-10-CM | POA: Diagnosis not present

## 2019-03-21 DIAGNOSIS — E785 Hyperlipidemia, unspecified: Secondary | ICD-10-CM

## 2019-03-21 DIAGNOSIS — G35 Multiple sclerosis: Secondary | ICD-10-CM | POA: Diagnosis not present

## 2019-03-21 MED ORDER — KETOCONAZOLE 2 % EX SHAM: 1.0000 "application " | MEDICATED_SHAMPOO | Freq: Every day | CUTANEOUS | 2 refills | Status: DC | PRN

## 2019-03-21 MED ORDER — DESONIDE 0.05 % EX CREA: TOPICAL_CREAM | Freq: Every day | CUTANEOUS | 2 refills | Status: DC | PRN

## 2019-03-21 NOTE — Progress Notes (Signed)
Subjective:    Patient ID: Miguel [name], male    DOB: [birth year], 48 y.o.   MRN: [id number]  Miguel [name] is a 48 y.o. male presenting on 03/21/2019 for Annual Exam   HPI  Here for Annual Physical and Lab Review  FOLLOW-UPCHRONIC DM, Type 2: See prior notes for background information. He is doing well on Ozempic 1mg . He admits that steroid bag infusions often increases sugar. He has had some diet changes due to limited on groceries pandemic. - Last A1c 6.7 CBG log: avg  120-130 at times, then 140-160. Meds- - Ozempic 1mg  [place] weekly - admits some constipation related -Basaglar at 14u - taking nightly, often he increases dose with infusions. He is asking to shift it to mealtime. Currently on ACEi Lifestyle: - Diet (Still improvingDM diet - trying to maintain improve) - Exercise (Limited exercisemore recently) -Had some weight loss previously, now recently wt gain +5 lbs in past 3-6 months - Scheduled for DM [facility] 05/13/18 Denies hypoglycemia  Chronic Neuropathic Pain / Back Pain / Multiple Sclerosis Followed by [facility]. See outside record for details. Continues on disease modifying therapy infusion every few months, rarely has flare up requiring bag steroid infusion. - Taking Gabapentin, Baclofen, Tramadol - temporary relief - Has been receiving spinal injections as welL  HYPERLIPIDEMIA: - Reports no concerns. Last lipid panel 02/2019, stable from previous, controlled LDL - Currently taking Atorvastatin 20mg  daily, tolerating well without side effects or myalgias  Health Maintenance: UTD Flu vaccine 03/17/19  UTD PNA vaccine   Depression screen [facility] 2/9 03/21/2019 03/17/2018 12/02/2017  Decreased Interest 0 0 0  Down, Depressed, Hopeless 0 0 0  PHQ - 2 Score 0 0 0    Past Medical History:  Diagnosis Date  . Allergy   . Sleep apnea    Past Surgical History:  Procedure Laterality Date  . HERNIA REPAIR     Social History    Socioeconomic History  . Marital status: Married    Spouse name: Not on file  . Number of children: Not on file  . Years of education: Not on file  . Highest education level: Not on file  Occupational History  . Not on file  Social Needs  . Financial resource strain: Not on file  . Food insecurity    Worry: Not on file    Inability: Not on file  . Transportation needs    Medical: Not on file    Non-medical: Not on file  Tobacco Use  . Smoking status: Never Smoker  . Smokeless tobacco: Never Used  Substance and Sexual Activity  . Alcohol use: No  . Drug use: No  . Sexual activity: Not on file  Lifestyle  . Physical activity    Days per week: Not on file    Minutes per session: Not on file  . Stress: Not on file  Relationships  . Social Herbalist on phone: Not on file    Gets together: Not on file    Attends religious service: Not on file    Active member of club or organization: Not on file    Attends meetings of clubs or organizations: Not on file    Relationship status: Not on file  . Intimate partner violence    Fear of current or ex partner: Not on file    Emotionally abused: Not on file    Physically abused: Not on file    Forced sexual  activity: Not on file  Other Topics Concern  . Not on file  Social History Narrative  . Not on file   Family History  Problem Relation Age of Onset  . Colon cancer Neg Hx   . Prostate cancer Neg Hx    Current Outpatient Medications on File Prior to Visit  Medication Sig  . atorvastatin (LIPITOR) 20 MG tablet TAKE 1 TABLET(20 MG) BY MOUTH DAILY  . B-D UF III MINI PEN NEEDLES 31G X 5 MM MISC USE AS DIRECTED ONCE A DAY  . baclofen (LIORESAL) 10 MG tablet TK 1 T PO  TID PRN FOR SPASMS  . Cholecalciferol (D 1000) 1000 units capsule Take by mouth.   . Cyanocobalamin (VITAMIN B-12) 5000 MCG SUBL Place under the tongue.  . gabapentin (NEURONTIN) 300 MG capsule Take 300 mg by mouth continuous as needed.   Marland Kitchen glucose  blood (ONE TOUCH ULTRA TEST) test strip USE AS DIRECTED TO CHECK BLOOD SUGAR THREE TIMES DAILY  . Insulin Glargine (BASAGLAR KWIKPEN) 100 UNIT/ML SOPN INJECT 14 UNITS UNDER THE SKIN EVERY NIGHT AT BEDTIME  . Lancets (ONETOUCH ULTRASOFT) lancets Check blood sugar three times daily.  Marland Kitchen lisinopril (ZESTRIL) 2.5 MG tablet TAKE 1 TABLET(2.5 MG) BY MOUTH DAILY  . Ocrelizumab (OCREVUS IV) Inject into the vein.  Marland Kitchen oxybutynin (DITROPAN) 5 MG tablet Take 5 mg by mouth.  . oxyCODONE-acetaminophen (PERCOCET/ROXICET) 5-325 MG tablet Take by mouth.  Marland Kitchen OZEMPIC, 1 MG/DOSE, 2 MG/1.5ML SOPN INJECT 1 MG INTO SKIN EVERY WEEK  . traMADol (ULTRAM) 50 MG tablet Take by mouth.  Marland Kitchen ibuprofen (ADVIL,MOTRIN) 800 MG tablet Take 800 mg by mouth every 6 (six) hours as needed.   . tamsulosin (FLOMAX) 0.4 MG CAPS capsule Take 0.4 mg by mouth.   No current facility-administered medications on file prior to visit.     Review of Systems  Constitutional: Negative for activity change, appetite change, chills, diaphoresis, fatigue and fever.  HENT: Negative for congestion and hearing loss.   Eyes: Negative for visual disturbance.  Respiratory: Negative for apnea, cough, chest tightness, shortness of breath and wheezing.   Cardiovascular: Negative for chest pain, palpitations and leg swelling.  Gastrointestinal: Negative for abdominal pain, anal bleeding, blood in stool, constipation, diarrhea, nausea and vomiting.  Endocrine: Negative for cold intolerance and heat intolerance.  Genitourinary: Negative for difficulty urinating, dysuria, frequency and hematuria.  Musculoskeletal: Positive for back pain. Negative for arthralgias and neck pain.  Skin: Negative for rash.  Allergic/Immunologic: Negative for environmental allergies.  Neurological: Negative for dizziness, weakness, light-headedness, numbness and headaches.  Hematological: Negative for adenopathy.  Psychiatric/Behavioral: Negative for behavioral problems, dysphoric  mood and sleep disturbance. The patient is not nervous/anxious.    Per HPI unless specifically indicated above      Objective:    BP 116/66 (BP Location: Left Arm, Patient Position: Sitting, Cuff Size: Normal)   Pulse 68   Ht 5\' 8"  (1.727 m)   Wt 238 lb 12.8 oz (108.3 kg)   BMI 36.31 kg/m   Wt Readings from Last 3 Encounters:  03/21/19 238 lb 12.8 oz (108.3 kg)  03/17/18 250 lb (113.4 kg)  12/02/17 245 lb (111.1 kg)    Physical Exam Vitals signs and nursing note reviewed.  Constitutional:      General: He is not in acute distress.    Appearance: He is well-developed. He is not diaphoretic.     Comments: Well-appearing, comfortable, cooperative  HENT:     Head: Normocephalic and atraumatic.  Eyes:     General:        Right eye: No discharge.        Left eye: No discharge.     Conjunctiva/sclera: Conjunctivae normal.     Pupils: Pupils are equal, round, and reactive to light.  Neck:     Musculoskeletal: Normal range of motion and neck supple.     Thyroid: No thyromegaly.  Cardiovascular:     Rate and Rhythm: Normal rate and regular rhythm.     Heart sounds: Normal heart sounds. No murmur.  Pulmonary:     Effort: Pulmonary effort is normal. No respiratory distress.     Breath sounds: Normal breath sounds. No wheezing or rales.  Abdominal:     General: Bowel sounds are normal. There is no distension.     Palpations: Abdomen is soft. There is no mass.     Tenderness: There is no abdominal tenderness.  Musculoskeletal: Normal range of motion.        General: No tenderness.     Comments: Upper / Lower Extremities: - Normal muscle tone, strength bilateral upper extremities 5/5, lower extremities 5/5  Lymphadenopathy:     Cervical: No cervical adenopathy.  Skin:    General: Skin is warm and dry.     Findings: No erythema or rash.  Neurological:     Mental Status: He is alert and oriented to person, place, and time.     Comments: Distal sensation intact to light touch  all extremities  Psychiatric:        Behavior: Behavior normal.     Comments: Well groomed, good eye contact, normal speech and thoughts      Diabetic Foot Exam - Simple   Simple Foot Form Diabetic Foot exam was performed with the following findings: Yes 03/21/2019  9:08 AM  Visual Inspection See comments: Yes Sensation Testing Intact to touch and monofilament testing bilaterally: Yes Pulse Check Posterior Tibialis and Dorsalis pulse intact bilaterally: Yes Comments Bilateral dry skin, great toe callus, no ulceration. Intact monofilament sensation.      Recent Labs    03/15/19 0757  HGBA1C 6.7*     Results for orders placed or performed in visit on 03/15/19  Hemoglobin A1c  Result Value Ref Range   Hgb A1c MFr Bld 6.7 (H) <5.7 % of total Hgb   Mean Plasma Glucose 146 (calc)   eAG (mmol/L) 8.1 (calc)  Comprehensive metabolic panel  Result Value Ref Range   Glucose, Bld 175 (H) 65 - 99 mg/dL   BUN 13 7 - 25 mg/dL   Creat 0.81 0.60 - 1.35 mg/dL   BUN/Creatinine Ratio NOT APPLICABLE 6 - 22 (calc)   Sodium 139 135 - 146 mmol/L   Potassium 4.1 3.5 - 5.3 mmol/L   Chloride 103 98 - 110 mmol/L   CO2 26 20 - 32 mmol/L   Calcium 9.6 8.6 - 10.3 mg/dL   Total Protein 6.5 6.1 - 8.1 g/dL   Albumin 4.5 3.6 - 5.1 g/dL   Globulin 2.0 1.9 - 3.7 g/dL (calc)   AG Ratio 2.3 1.0 - 2.5 (calc)   Total Bilirubin 0.9 0.2 - 1.2 mg/dL   Alkaline phosphatase (APISO) 83 36 - 130 U/L   AST 16 10 - 40 U/L   ALT 27 9 - 46 U/L  CBC with Differential/Platelet  Result Value Ref Range   WBC 5.7 3.8 - 10.8 Thousand/uL   RBC 4.94 4.20 - 5.80 Million/uL   Hemoglobin 15.7 13.2 - 17.1  g/dL   HCT 45.5 38.5 - 50.0 %   MCV 92.1 80.0 - 100.0 fL   MCH 31.8 27.0 - 33.0 pg   MCHC 34.5 32.0 - 36.0 g/dL   RDW 11.8 11.0 - 15.0 %   Platelets 199 140 - 400 Thousand/uL   MPV 10.7 7.5 - 12.5 fL   Neutro Abs 4,064 1,500 - 7,800 cells/uL   Lymphs Abs 1,106 850 - 3,900 cells/uL   Absolute Monocytes 439 200 -  950 cells/uL   Eosinophils Absolute 80 15 - 500 cells/uL   Basophils Absolute 11 0 - 200 cells/uL   Neutrophils Relative % 71.3 %   Total Lymphocyte 19.4 %   Monocytes Relative 7.7 %   Eosinophils Relative 1.4 %   Basophils Relative 0.2 %  Lipid panel  Result Value Ref Range   Cholesterol 149 <200 mg/dL   HDL 32 (L) > OR = 40 mg/dL   Triglycerides 110 <150 mg/dL   LDL Cholesterol (Calc) 96 mg/dL (calc)   Total CHOL/HDL Ratio 4.7 <5.0 (calc)   Non-HDL Cholesterol (Calc) 117 <130 mg/dL (calc)      Assessment & Plan:   Problem List Items Addressed This Visit    Type 2 diabetes mellitus with other specified complication (HCC)    Improved control A1c to 6.7 now On treatment for MS. Rare steroid infusion for MS flare (hyperglycemia with steroids) No evidence of hypoglycemia on GLP and Insulin Failed Metformin 1000mg  BID (GI intolerance), XR was ineffective Complications - other including dyslipidemia with low HDL and history of hypertriglyceridemia, obesity, OSA - increases risk of future cardiovascular complications / poor glucose control due to OSA  Plan:  1. Continue current max dose Ozempic 1mg  weekly [place] - Continue on Basaglar - now at 14 u - titrate down as advised, consider DC in future 2. Encourage improved lifestyle - low carb, low sugar diet, reduce portion size, continue improving regular exercise 3. Check CBG, bring log to next visit for review again 4. Continue Statin, ACE 5. Follow-up 6 months DM A1c      Multiple sclerosis (HCC)    Stable without recent exacerbation Followed by [facility] Neurology Continue current therapy as directed      Hyperlipidemia associated with type 2 diabetes mellitus ([place])    Mostly controlled cholesterol on statin and improved lifestyle - still slight low HDL Last lipid panel 02/2019 Calculated ASCVD 10 yr risk score 3.4%, but diabetic  Plan: 1. Continue current meds - Atorvastatin 20mg  daily 2. Continue ASA 81mg  for primary ASCVD  risk reduction 3. Encourage improved lifestyle - low carb/cholesterol, reduce portion size, continue improving regular exercise 4. Follow-up lipids yearly       Other Visit Diagnoses    Annual physical exam    -  Primary      Updated Health Maintenance information Reviewed recent lab results with patient Encouraged improvement to lifestyle with diet and exercise - Goal of weight loss  Additional questions - Regarding question residual fecal matter after wiping - asked him to review with Neurology if any possible muscle tone weakness could be related.  No orders of the defined types were placed in this encounter.   Follow up plan: Return in about 6 months (around 09/18/2019) for 6 month DM A1c.  Nobie Putnam, [facility] 03/21/2019, 8:57 AM

## 2019-03-21 NOTE — Assessment & Plan Note (Signed)
Mostly controlled cholesterol on statin and improved lifestyle - still slight low HDL Last lipid panel 02/2019 Calculated ASCVD 10 yr risk score 3.4%, but diabetic  Plan: 1. Continue current meds - Atorvastatin 20mg  daily 2. Continue ASA 81mg  for primary ASCVD risk reduction 3. Encourage improved lifestyle - low carb/cholesterol, reduce portion size, continue improving regular exercise 4. Follow-up lipids yearly

## 2019-03-21 NOTE — Assessment & Plan Note (Signed)
Improved control A1c to 6.7 now On treatment for MS. Rare steroid infusion for MS flare (hyperglycemia with steroids) No evidence of hypoglycemia on GLP and Insulin Failed Metformin 1000mg  BID (GI intolerance), XR was ineffective Complications - other including dyslipidemia with low HDL and history of hypertriglyceridemia, obesity, OSA - increases risk of future cardiovascular complications / poor glucose control due to OSA  Plan:  1. Continue current max dose Ozempic 1mg  weekly [place] - Continue on Basaglar - now at 14 u - titrate down as advised, consider DC in future 2. Encourage improved lifestyle - low carb, low sugar diet, reduce portion size, continue improving regular exercise 3. Check CBG, bring log to next visit for review again 4. Continue Statin, ACE 5. Follow-up 6 months DM A1c

## 2019-03-21 NOTE — Assessment & Plan Note (Signed)
Stable without recent exacerbation Followed by [facility] Continue current therapy as directed

## 2019-03-21 NOTE — Patient Instructions (Addendum)
Thank you for coming to the office today.  Try to resume reducing insulin basaglar from 14 down by 1-2 units every week approximately if fasting sugar < 140-150.  Continue Ozempic 1mg  dose.  Ordered creams / shampoo as requested  Ask your MS provider about the rectal.   Please schedule a Follow-up Appointment to: Return in about 6 months (around 09/18/2019) for 6 month DM A1c.  If you have any other questions or concerns, please feel free to call the office or send a message through [place]. You may also schedule an earlier appointment if necessary.  Additionally, you may be receiving a survey about your experience at our office within a few days to 1 week by e-mail or mail. We value your feedback.  Nobie Putnam, DO [place]

## 2019-03-27 ENCOUNTER — Other Ambulatory Visit: Payer: Self-pay | Admitting: Family Medicine

## 2019-03-27 DIAGNOSIS — E1169 Type 2 diabetes mellitus with other specified complication: Secondary | ICD-10-CM

## 2019-03-27 DIAGNOSIS — Z794 Long term (current) use of insulin: Secondary | ICD-10-CM

## 2019-03-28 ENCOUNTER — Other Ambulatory Visit: Payer: Self-pay | Admitting: Family Medicine

## 2019-03-28 DIAGNOSIS — Z794 Long term (current) use of insulin: Secondary | ICD-10-CM

## 2019-03-28 DIAGNOSIS — E1169 Type 2 diabetes mellitus with other specified complication: Secondary | ICD-10-CM

## 2019-03-28 MED ORDER — OZEMPIC (1 MG/DOSE) 2 MG/1.5ML ~~LOC~~ SOPN: 1.0000 mg | PEN_INJECTOR | SUBCUTANEOUS | 1 refills | Status: DC

## 2019-05-18 ENCOUNTER — Ambulatory Visit: Payer: BC Managed Care – PPO | Admitting: Family Medicine

## 2019-05-23 ENCOUNTER — Other Ambulatory Visit: Payer: Self-pay

## 2019-05-23 ENCOUNTER — Encounter: Payer: Self-pay | Admitting: Family Medicine

## 2019-05-23 ENCOUNTER — Ambulatory Visit (INDEPENDENT_AMBULATORY_CARE_PROVIDER_SITE_OTHER): Payer: BC Managed Care – PPO | Admitting: Family Medicine

## 2019-05-23 DIAGNOSIS — M62830 Muscle spasm of back: Secondary | ICD-10-CM | POA: Diagnosis not present

## 2019-05-23 DIAGNOSIS — M47814 Spondylosis without myelopathy or radiculopathy, thoracic region: Secondary | ICD-10-CM

## 2019-05-23 MED ORDER — TIZANIDINE HCL 4 MG PO TABS: 2.0000 mg | ORAL_TABLET | Freq: Three times a day (TID) | ORAL | 1 refills | Status: DC | PRN

## 2019-05-23 NOTE — Patient Instructions (Addendum)
Start Tizanidine as needed for muscle relaxant, half or two pill up to max 3 times daily Use heating pad, ice packs as needed Hold baclofen while on this med Take tramadol as needed Follow up if need we can switch medication, or in future can refer to PT if need  Please schedule a Follow-up Appointment to: Return in about 4 weeks (around 06/20/2019), or if symptoms worsen or fail to improve, for back pain.  If you have any other questions or concerns, please feel free to call the office or send a message through [place]. You may also schedule an earlier appointment if necessary.  Additionally, you may be receiving a survey about your experience at our office within a few days to 1 week by e-mail or mail. We value your feedback.  Nobie Putnam, DO [place]

## 2019-05-23 NOTE — Progress Notes (Signed)
Virtual Visit via Telephone The purpose of this virtual visit is to provide medical care while limiting exposure to the novel coronavirus (COVID19) for both patient and office staff.  Consent was obtained for phone visit:  Yes.   Answered questions that patient had about telehealth interaction:  Yes.   I discussed the limitations, risks, security and privacy concerns of performing an evaluation and management service by telephone. I also discussed with the patient that there may be a patient responsible charge related to this service. The patient expressed understanding and agreed to proceed.  Patient Location: Home Provider Location: [facility])  ---------------------------------------------------------------------- Chief Complaint  Patient presents with  . Back Pain    chronic Right mid back pain that is worsen with lying down x 4-5 mths      S: Reviewed CMA documentation. I have called patient and gathered additional HPI as follows:  RIGHT mid/low back Thoracic Back Pain Reports that symptoms started >4-5 months ago with back pain and arthritis issue, he has seen chiropractor and has had x-ray. Additional history, he has chronic hernia pain and neuralgia, he is followed by pain medicine, and he takes Tramadol 50mg  PRN for that pain and also for back. Also gets injection therapy. - He admits some days worse than others, with episodic back pain. Worse in evening he has muscle tension and spasm he feels it would improve if he massages or works on the muscle. He has some episodic sharp stabbing radiating pain. Improve with move around, if stop moving or rest in evening will have stiffness and spasm. - taking Gabapentin fairly regularly. He has rare Baclofen PRN but doesn't take it often at all.  Denies any high risk travel to areas of current concern for COVID19. Denies any known or suspected exposure to person with or possibly with COVID19.  Denies any fevers,  chills, sweats, body ache, cough, shortness of breath, sinus pain or pressure, headache, abdominal pain, diarrhea  Past Medical History:  Diagnosis Date  . Allergy   . Sleep apnea    Social History   Tobacco Use  . Smoking status: Never Smoker  . Smokeless tobacco: Never Used  Substance Use Topics  . Alcohol use: No  . Drug use: No    Current Outpatient Medications:  .  atorvastatin (LIPITOR) 20 MG tablet, TAKE 1 TABLET(20 MG) BY MOUTH DAILY, Disp: 90 tablet, Rfl: 1 .  B-D UF III MINI PEN NEEDLES 31G X 5 MM MISC, USE AS DIRECTED ONCE A DAY, Disp: 100 each, Rfl: 3 .  baclofen (LIORESAL) 10 MG tablet, TK 1 T PO  TID PRN FOR SPASMS, Disp: , Rfl: 0 .  Cholecalciferol (D 1000) 1000 units capsule, Take by mouth. , Disp: , Rfl:  .  Cyanocobalamin (VITAMIN B-12) 5000 MCG SUBL, Place under the tongue., Disp: , Rfl:  .  desonide (DESOWEN) 0.05 % cream, Apply topically daily as needed., Disp: 30 g, Rfl: 2 .  gabapentin (NEURONTIN) 300 MG capsule, Take 300 mg by mouth continuous as needed. , Disp: , Rfl:  .  glucose blood (ONE TOUCH ULTRA TEST) test strip, USE AS DIRECTED TO CHECK BLOOD SUGAR THREE TIMES DAILY, Disp: 300 each, Rfl: 2 .  ibuprofen (ADVIL,MOTRIN) 800 MG tablet, Take 800 mg by mouth every 6 (six) hours as needed. , Disp: , Rfl:  .  Insulin Glargine (BASAGLAR KWIKPEN) 100 UNIT/ML SOPN, INJECT 14 UNITS UNDER THE SKIN EVERY NIGHT AT BEDTIME, Disp: 15 mL, Rfl: 2 .  ketoconazole (NIZORAL) 2 % shampoo, Apply 1 application topically daily as needed., Disp: 120 mL, Rfl: 2 .  Lancets (ONETOUCH ULTRASOFT) lancets, Check blood sugar three times daily., Disp: 100 each, Rfl: 12 .  lisinopril (ZESTRIL) 2.5 MG tablet, TAKE 1 TABLET(2.5 MG) BY MOUTH DAILY, Disp: 90 tablet, Rfl: 1 .  Ocrelizumab (OCREVUS IV), Inject into the vein., Disp: , Rfl:  .  Semaglutide, 1 MG/DOSE, (OZEMPIC, 1 MG/DOSE,) 2 MG/1.5ML SOPN, Inject 1 mg as directed once a week., Disp: 9 mL, Rfl: 1 .  traMADol (ULTRAM) 50 MG  tablet, Take by mouth., Disp: , Rfl:  .  oxybutynin (DITROPAN) 5 MG tablet, Take 5 mg by mouth., Disp: , Rfl:  .  tamsulosin (FLOMAX) 0.4 MG CAPS capsule, Take 0.4 mg by mouth., Disp: , Rfl:  .  tiZANidine (ZANAFLEX) 4 MG tablet, Take 0.5-1 tablets (2-4 mg total) by mouth every 8 (eight) hours as needed for muscle spasms., Disp: 60 tablet, Rfl: 1  Depression screen [facility] 2/9 03/21/2019 03/17/2018 12/02/2017  Decreased Interest 0 0 0  Down, Depressed, Hopeless 0 0 0  PHQ - 2 Score 0 0 0    No flowsheet data found.  -------------------------------------------------------------------------- O: No physical exam performed due to remote telephone encounter.  Lab results reviewed.  I have personally reviewed the radiology report from 03/21/19 Thoracic X-ray.   CLINICAL DATA:  Right lower thoracic pain for 3 months.  EXAM: THORACIC SPINE 2 VIEWS  COMPARISON:  None  FINDINGS: There is no evidence of thoracic spine fracture. Alignment is normal. Multi level disc space narrowing and ventral endplate spurring is identified throughout the thoracic spine.  IMPRESSION: 1. No acute findings. 2. Thoracic spondylosis.   Electronically Signed   By: Kerby Moors M.D.   On: 03/21/2019 12:06   Recent Results (from the past 2160 hour(s))  Hemoglobin A1c     Status: Abnormal   Collection Time: 03/15/19  7:57 AM  Result Value Ref Range   Hgb A1c MFr Bld 6.7 (H) <5.7 % of total Hgb    Comment: For someone without known diabetes, a hemoglobin A1c value of 6.5% or greater indicates that they may have  diabetes and this should be confirmed with a follow-up  test. . For someone with known diabetes, a value <7% indicates  that their diabetes is well controlled and a value  greater than or equal to 7% indicates suboptimal  control. A1c targets should be individualized based on  duration of diabetes, age, comorbid conditions, and  other considerations. . Currently, no consensus exists  regarding use of hemoglobin A1c for diagnosis of diabetes for children. .    Mean Plasma Glucose 146 (calc)   eAG (mmol/L) 8.1 (calc)  Comprehensive metabolic panel     Status: Abnormal   Collection Time: 03/15/19  7:57 AM  Result Value Ref Range   Glucose, Bld 175 (H) 65 - 99 mg/dL    Comment: .            Fasting reference interval . For someone without known diabetes, a glucose value >125 mg/dL indicates that they may have diabetes and this should be confirmed with a follow-up test. .    BUN 13 7 - 25 mg/dL   Creat 0.81 0.60 - 1.35 mg/dL   BUN/Creatinine Ratio NOT APPLICABLE 6 - 22 (calc)   Sodium 139 135 - 146 mmol/L   Potassium 4.1 3.5 - 5.3 mmol/L   Chloride 103 98 - 110 mmol/L   CO2 26 20 -  32 mmol/L   Calcium 9.6 8.6 - 10.3 mg/dL   Total Protein 6.5 6.1 - 8.1 g/dL   Albumin 4.5 3.6 - 5.1 g/dL   Globulin 2.0 1.9 - 3.7 g/dL (calc)   AG Ratio 2.3 1.0 - 2.5 (calc)   Total Bilirubin 0.9 0.2 - 1.2 mg/dL   Alkaline phosphatase (APISO) 83 36 - 130 U/L   AST 16 10 - 40 U/L   ALT 27 9 - 46 U/L  CBC with Differential/Platelet     Status: None   Collection Time: 03/15/19  7:57 AM  Result Value Ref Range   WBC 5.7 3.8 - 10.8 Thousand/uL   RBC 4.94 4.20 - 5.80 Million/uL   Hemoglobin 15.7 13.2 - 17.1 g/dL   HCT 45.5 38.5 - 50.0 %   MCV 92.1 80.0 - 100.0 fL   MCH 31.8 27.0 - 33.0 pg   MCHC 34.5 32.0 - 36.0 g/dL   RDW 11.8 11.0 - 15.0 %   Platelets 199 140 - 400 Thousand/uL   MPV 10.7 7.5 - 12.5 fL   Neutro Abs 4,064 1,500 - 7,800 cells/uL   Lymphs Abs 1,106 850 - 3,900 cells/uL   Absolute Monocytes 439 200 - 950 cells/uL   Eosinophils Absolute 80 15 - 500 cells/uL   Basophils Absolute 11 0 - 200 cells/uL   Neutrophils Relative % 71.3 %   Total Lymphocyte 19.4 %   Monocytes Relative 7.7 %   Eosinophils Relative 1.4 %   Basophils Relative 0.2 %  Lipid panel     Status: Abnormal   Collection Time: 03/15/19  7:57 AM  Result Value Ref Range   Cholesterol 149 <200  mg/dL   HDL 32 (L) > OR = 40 mg/dL   Triglycerides 110 <150 mg/dL   LDL Cholesterol (Calc) 96 mg/dL (calc)    Comment: Reference range: <100 . Desirable range <100 mg/dL for primary prevention;   <70 mg/dL for patients with CHD or diabetic patients  with > or = 2 CHD risk factors. Marland Kitchen LDL-C is now calculated using the Martin-Hopkins  calculation, which is a validated novel method providing  better accuracy than the Friedewald equation in the  estimation of LDL-C.  Cresenciano Genre et al. Annamaria Helling. [id number]): 2061-2068  (http://education.[employer].com/faq/FAQ164)    Total CHOL/HDL Ratio 4.7 <5.0 (calc)   Non-HDL Cholesterol (Calc) 117 <130 mg/dL (calc)    Comment: For patients with diabetes plus 1 major ASCVD risk  factor, treating to a non-HDL-C goal of <100 mg/dL  (LDL-C of <70 mg/dL) is considered a therapeutic  option.     -------------------------------------------------------------------------- A&P:  Problem List Items Addressed This Visit    None    Visit Diagnoses    Spondylosis of thoracic region without myelopathy or radiculopathy    -  Primary   Relevant Medications   traMADol (ULTRAM) 50 MG tablet   tiZANidine (ZANAFLEX) 4 MG tablet   Spasm of thoracic back muscle       Relevant Medications   tiZANidine (ZANAFLEX) 4 MG tablet     Subacute on chronic R mid to low back pain without associated sciatica. Suspect likely due to muscle spasm/strain, without known injury or trauma. In setting of known chronic LBP with thoracic DJD - No red flag symptoms. Negative SLR for radiculopathy - Inadequate conservative therapy  Reviewed X-ray from 03/2019  Plan: 1. Start Tizanidine 2-4mg  TID PRN, caution sedation, stop baclofen for now, given mostly muscle spasm related symptoms. He can continue current pain management therapy for  his other problems with hernia etc and MS with Tramadol, he may use NSAID / Tylenol, topical treatment as well, in future offered PT he can return to  worker's comp in future if need he says for PT   Meds ordered this encounter  Medications  . tiZANidine (ZANAFLEX) 4 MG tablet    Sig: Take 0.5-1 tablets (2-4 mg total) by mouth every 8 (eight) hours as needed for muscle spasms.    Dispense:  60 tablet    Refill:  1    Follow-up: - Return in 4-6 weeks as needed back pain  Patient verbalizes understanding with the above medical recommendations including the limitation of remote medical advice.  Specific follow-up and call-back criteria were given for patient to follow-up or seek medical care more urgently if needed.   - Time spent in direct consultation with patient on phone: 12 minutes  Nobie Putnam, [facility] 05/23/2019, 9:26 AM

## 2019-06-29 ENCOUNTER — Other Ambulatory Visit: Payer: Self-pay | Admitting: Family Medicine

## 2019-06-29 DIAGNOSIS — M47814 Spondylosis without myelopathy or radiculopathy, thoracic region: Secondary | ICD-10-CM

## 2019-06-29 DIAGNOSIS — M62830 Muscle spasm of back: Secondary | ICD-10-CM

## 2019-07-02 ENCOUNTER — Other Ambulatory Visit: Payer: Self-pay | Admitting: Family Medicine

## 2019-07-02 DIAGNOSIS — E0800 Diabetes mellitus due to underlying condition with hyperosmolarity without nonketotic hyperglycemic-hyperosmolar coma (NKHHC): Secondary | ICD-10-CM

## 2019-07-02 DIAGNOSIS — E783 Hyperchylomicronemia: Secondary | ICD-10-CM

## 2019-08-15 ENCOUNTER — Other Ambulatory Visit: Payer: Self-pay | Admitting: Family Medicine

## 2019-08-15 DIAGNOSIS — Z794 Long term (current) use of insulin: Secondary | ICD-10-CM

## 2019-08-15 DIAGNOSIS — E119 Type 2 diabetes mellitus without complications: Secondary | ICD-10-CM

## 2019-09-05 LAB — HM DIABETES EYE EXAM

## 2019-09-06 ENCOUNTER — Encounter: Payer: Self-pay | Admitting: Family Medicine

## 2019-09-20 ENCOUNTER — Encounter: Payer: Self-pay | Admitting: Family Medicine

## 2019-09-20 ENCOUNTER — Ambulatory Visit (INDEPENDENT_AMBULATORY_CARE_PROVIDER_SITE_OTHER): Payer: BC Managed Care – PPO | Admitting: Family Medicine

## 2019-09-20 ENCOUNTER — Other Ambulatory Visit: Payer: Self-pay

## 2019-09-20 ENCOUNTER — Other Ambulatory Visit: Payer: Self-pay | Admitting: Family Medicine

## 2019-09-20 VITALS — BP 120/82 | HR 89 | Temp 97.1°F | Resp 16 | Ht 68.0 in | Wt 251.0 lb

## 2019-09-20 DIAGNOSIS — Z794 Long term (current) use of insulin: Secondary | ICD-10-CM

## 2019-09-20 DIAGNOSIS — Z0000 Encounter for general adult medical examination without abnormal findings: Secondary | ICD-10-CM

## 2019-09-20 DIAGNOSIS — E1169 Type 2 diabetes mellitus with other specified complication: Secondary | ICD-10-CM | POA: Diagnosis not present

## 2019-09-20 DIAGNOSIS — G35 Multiple sclerosis: Secondary | ICD-10-CM

## 2019-09-20 DIAGNOSIS — B360 Pityriasis versicolor: Secondary | ICD-10-CM

## 2019-09-20 DIAGNOSIS — E785 Hyperlipidemia, unspecified: Secondary | ICD-10-CM

## 2019-09-20 DIAGNOSIS — Z125 Encounter for screening for malignant neoplasm of prostate: Secondary | ICD-10-CM

## 2019-09-20 LAB — POCT GLYCOSYLATED HEMOGLOBIN (HGB A1C): Hemoglobin A1C: 7.7 % — AB (ref 4.0–5.6)

## 2019-09-20 MED ORDER — KETOCONAZOLE 2 % EX SHAM: 1.0000 "application " | MEDICATED_SHAMPOO | Freq: Every day | CUTANEOUS | 2 refills | Status: AC | PRN

## 2019-09-20 MED ORDER — OZEMPIC (1 MG/DOSE) 2 MG/1.5ML ~~LOC~~ SOPN: 1.0000 mg | PEN_INJECTOR | SUBCUTANEOUS | 1 refills | Status: DC

## 2019-09-20 NOTE — Patient Instructions (Addendum)
Thank you for coming to the office today.  Increased Basaglar insulin from 14 to 16, can go up by 2 units every 1 week as needed goal sugar fasting range < 150 if possible. May go up to around 20 units approximately  Re ordered Ozempic for 90 day, will try to call rep and ask when the 1.5 and then 2 mg dosage is approved for use.  Re ordered Ketoconazole shampoo  In 3 months when due for next fill Ozempic, call or message we can check status of higher dose 1.5 Ozempic.  If 2nd dose COVID19 vaccine gives reaction - can call or message for prednisone as discussed today as preventative for MS.  DUE for FASTING BLOOD WORK (no food or drink after midnight before the lab appointment, only water or coffee without cream/sugar on the morning of)  SCHEDULE "Lab Only" visit in the morning at the clinic for lab draw in 6 MONTHS   - Make sure Lab Only appointment is at about 1 week before your next appointment, so that results will be available  For Lab Results, once available within 2-3 days of blood draw, you can can log in to MyChart online to view your results and a brief explanation. Also, we can discuss results at next follow-up visit.   Please schedule a Follow-up Appointment to: Return in about 6 months (around 03/22/2020) for Annual Physical.  If you have any other questions or concerns, please feel free to call the office or send a message through [place]. You may also schedule an earlier appointment if necessary.  Additionally, you may be receiving a survey about your experience at our office within a few days to 1 week by e-mail or mail. We value your feedback.  Nobie Putnam, DO [place]

## 2019-09-20 NOTE — Assessment & Plan Note (Signed)
Elevated A1c to 7.7 On treatment for MS. Rare steroid infusion for MS flare (hyperglycemia with steroids) No evidence of hypoglycemia on GLP and Insulin Failed Metformin 1000mg  BID (GI intolerance), XR was ineffective Complications - other including dyslipidemia with low HDL and history of hypertriglyceridemia, obesity, OSA - increases risk of future cardiovascular complications / poor glucose control due to OSA  Plan:  1. Continue current max dose Ozempic 1mg  weekly [place] - future will anticipate dose adjust to 1.5 or 2mg  in future once approved for that dosing indication - Increase Basaglar up to 16 units, up by 2 units q 1 week if fasting glucose >150 as advised, may max at around 20 units nightly 2. Encourage improved lifestyle - low carb, low sugar diet, reduce portion size, continue improving regular exercise 3. Check CBG, bring log to next visit for review again 4. Continue Statin, ACE  F/u 6 month physical

## 2019-09-20 NOTE — Progress Notes (Signed)
Subjective:    Patient ID: Miguel [name], male    DOB: [birth year], 49 y.o.   MRN: [id number]  Miguel [name] is a 49 y.o. male presenting on 09/20/2019 for Diabetes   HPI   FOLLOW-UPCHRONIC DM, Type 2: See prior notes for background information. He is doing well on Ozempic 1mg . He admits that steroid bag infusions often increases sugar. He has not been as active lately but now improving again. Last A1c 6.7, due today for A1c CBG log: avg150-180 at times, occasional >200 - higher readings now Meds- - Ozempic1mg  [place] weekly- admits some constipation related -Basaglar at 14u nightly (would take up to 20u with steroids) Currently on ACEi Lifestyle: - Diet (Still improvingDM diet- trying to maintain improve) - Exercise (Limited exercisemore recently) - Weight gain again up to 10-12 lbs Denies hypoglycemia  Chronic Neuropathic Pain /Back Pain / Multiple Sclerosis Followed by [facility] Neurology. See outside record for details. Continues on disease modifying therapy infusion every few months, rarely has flare up requiring bag steroid infusion. - Taking Gabapentin, Tramadol PRN - Has been receiving spinal injections as well - Today reports since 05/2019 on switch to Tizanidine he has done great (off baclofen). Takes half of 4mg  pill PRN with great results.  Tinea versicolor Prior history, resolves w/ ketoconazole shampoo PRN, flare in summer warmer temperatures, request refill  Health Maintenance: UTD COVID19 vaccine 1st dose 09/06/19 - had some mild flare from MS, asking about possibility of mild steroid course  Depression screen [facility] 2/9 09/20/2019 03/21/2019 03/17/2018  Decreased Interest 0 0 0  Down, Depressed, Hopeless 0 0 0  PHQ - 2 Score 0 0 0    Social History   Tobacco Use  . Smoking status: Never Smoker  . Smokeless tobacco: Never Used  Substance Use Topics  . Alcohol use: No  . Drug use: No    Review of Systems Per HPI unless specifically indicated  above     Objective:    BP 120/82   Pulse 89   Temp (!) 97.1 F (36.2 C) (Temporal)   Resp 16   Ht 5\' 8"  (1.727 m)   Wt 251 lb (113.9 kg)   BMI 38.16 kg/m   Wt Readings from Last 3 Encounters:  09/20/19 251 lb (113.9 kg)  03/21/19 238 lb 12.8 oz (108.3 kg)  03/17/18 250 lb (113.4 kg)    Physical Exam Vitals and nursing note reviewed.  Constitutional:      General: He is not in acute distress.    Appearance: He is well-developed. He is not diaphoretic.     Comments: Well-appearing, comfortable, cooperative  HENT:     Head: Normocephalic and atraumatic.  Eyes:     General:        Right eye: No discharge.        Left eye: No discharge.     Conjunctiva/sclera: Conjunctivae normal.  Cardiovascular:     Rate and Rhythm: Normal rate.  Pulmonary:     Effort: Pulmonary effort is normal.  Skin:    General: Skin is warm and dry.     Findings: Rash (patchy areas of skin with discoloration no other abnormal features, similar to prior tinea versicolor) present. No erythema.  Neurological:     Mental Status: He is alert and oriented to person, place, and time.  Psychiatric:        Behavior: Behavior normal.     Comments: Well groomed, good eye contact, normal speech and thoughts  Recent Labs    03/15/19 0757 09/20/19 0833  HGBA1C 6.7* 7.7*    Results for orders placed or performed in visit on 09/20/19  POCT HgB A1C  Result Value Ref Range   Hemoglobin A1C 7.7 (A) 4.0 - 5.6 %      Assessment & Plan:   Problem List Items Addressed This Visit    Type 2 diabetes mellitus with other specified complication ([place]) - Primary    Elevated A1c to 7.7 On treatment for MS. Rare steroid infusion for MS flare (hyperglycemia with steroids) No evidence of hypoglycemia on GLP and Insulin Failed Metformin 1000mg  BID (GI intolerance), XR was ineffective Complications - other including dyslipidemia with low HDL and history of hypertriglyceridemia, obesity, OSA - increases risk of  future cardiovascular complications / poor glucose control due to OSA  Plan:  1. Continue current max dose Ozempic 1mg  weekly [place] - future will anticipate dose adjust to 1.5 or 2mg  in future once approved for that dosing indication - Increase Basaglar up to 16 units, up by 2 units q 1 week if fasting glucose >150 as advised, may max at around 20 units nightly 2. Encourage improved lifestyle - low carb, low sugar diet, reduce portion size, continue improving regular exercise 3. Check CBG, bring log to next visit for review again 4. Continue Statin, ACE  F/u 6 month physical      Relevant Medications   OZEMPIC, 1 MG/DOSE, 2 MG/1.5ML SOPN   Insulin Glargine (BASAGLAR KWIKPEN) 100 UNIT/ML   Other Relevant Orders   POCT HgB A1C (Completed)   Multiple sclerosis (HCC)    Stable without recent exacerbation Followed by [facility] Neurology Continue current therapy as directed  Had reaction to COVID19 vaccine some MS flare, may have repeat flare with 2nd dose soon, if does, can call for prednisone burst high dose course 1 week.       Other Visit Diagnoses    Tinea versicolor       Consistent on exam. Will refill prior Ketoconazole shampoo rx from Dermatology for same problem   Relevant Medications   ketoconazole (NIZORAL) 2 % shampoo       Meds ordered this encounter  Medications  . ketoconazole (NIZORAL) 2 % shampoo    Sig: Apply 1 application topically daily as needed.    Dispense:  120 mL    Refill:  2  . OZEMPIC, 1 MG/DOSE, 2 MG/1.5ML SOPN    Sig: Inject 1 mg as directed once a week.    Dispense:  9 mL    Refill:  1    84 day fill - 3 month      Follow up plan: Return in about 6 months (around 03/22/2020) for Annual Physical.  Future labs ordered for 03/15/20  Nobie Putnam, [facility] 09/20/2019, 8:21 AM

## 2019-09-20 NOTE — Assessment & Plan Note (Signed)
Stable without recent exacerbation Followed by [facility] Neurology Continue current therapy as directed  Had reaction to COVID19 vaccine some MS flare, may have repeat flare with 2nd dose soon, if does, can call for prednisone burst high dose course 1 week.

## 2019-09-28 ENCOUNTER — Telehealth: Payer: Self-pay

## 2019-09-28 DIAGNOSIS — G35 Multiple sclerosis: Secondary | ICD-10-CM

## 2019-09-28 MED ORDER — PREDNISONE 20 MG PO TABS: ORAL_TABLET | ORAL | 0 refills | Status: DC

## 2019-09-28 NOTE — Telephone Encounter (Signed)
Patient notified

## 2019-09-28 NOTE — Telephone Encounter (Signed)
Sent rx Prednisone 1 week taper as discussed to pharmacy, due to possible side effect / reaction with COVID19 vaccine as discussed  Nobie Putnam, [facility] 09/28/2019, 2:02 PM

## 2019-09-28 NOTE — Telephone Encounter (Signed)
Copied from [place] [id number]. Topic: General - Other >> Sep 28, 2019  1:12 PM Yvette Rack wrote: Reason for CRM: Pt stated he had an appt on 09/20/19 with Dr. Parks Ranger and he was told if he needed a steroid a Rx would be sent to his pharmacy. Pt requests that the Rx for the steroid be sent to [place], [place]

## 2019-10-01 ENCOUNTER — Other Ambulatory Visit: Payer: Self-pay | Admitting: Family Medicine

## 2019-10-01 DIAGNOSIS — Z794 Long term (current) use of insulin: Secondary | ICD-10-CM

## 2019-11-23 ENCOUNTER — Ambulatory Visit: Payer: BC Managed Care – PPO | Admitting: Family Medicine

## 2019-11-23 ENCOUNTER — Other Ambulatory Visit: Payer: Self-pay

## 2019-11-23 ENCOUNTER — Encounter: Payer: Self-pay | Admitting: Family Medicine

## 2019-11-23 VITALS — BP 124/75 | HR 88 | Temp 97.1°F | Resp 16 | Ht 68.0 in | Wt 248.6 lb

## 2019-11-23 DIAGNOSIS — N200 Calculus of kidney: Secondary | ICD-10-CM

## 2019-11-23 MED ORDER — OXYBUTYNIN CHLORIDE 5 MG PO TABS: 5.0000 mg | ORAL_TABLET | Freq: Three times a day (TID) | ORAL | 0 refills | Status: DC | PRN

## 2019-11-23 MED ORDER — TAMSULOSIN HCL 0.4 MG PO CAPS: 0.4000 mg | ORAL_CAPSULE | Freq: Every day | ORAL | 1 refills | Status: DC | PRN

## 2019-11-23 NOTE — Patient Instructions (Addendum)
Thank you for coming to the office today.  Look into the following providers - feel free to speak with their office more if specific questions.  They should do whatever you need - but feel free to ask them. Let me know if need more advice.  Sherlynn Stalls, MD  Darla Lesches, MD  [facility] -1st floor [place],  [place]  [address] Phone: [phone]  Say the word if you need me to refer you for Kidney stone management.  Refilled Tamsulosin 0.4mg  once daily for future kidney stone. Also Ditropan (Oxybutynin as needed bladder spasm)  Please schedule a Follow-up Appointment to: Return if symptoms worsen or fail to improve, for kidney stones.  If you have any other questions or concerns, please feel free to call the office or send a message through [place]. You may also schedule an earlier appointment if necessary.  Additionally, you may be receiving a survey about your experience at our office within a few days to 1 week by e-mail or mail. We value your feedback.  Nobie Putnam, DO [place]

## 2019-11-23 NOTE — Progress Notes (Signed)
Subjective:    Patient ID: Miguel [name], male    DOB: [birth year], 49 y.o.   MRN: [id number]  Miguel [name] is a 49 y.o. male presenting on 11/23/2019 for Nephrolithiasis   HPI   Nephrolithiasis Chronic history multiple kidney stones in past. He has history of chronic MS and diabetes. He has issues with incomplete bladder emptying and history of prostatitis in past. Previously followed by [facility], prior visit in past 1-2 years. Before that seen Dr Jacqlyn Larsen in [facility] through [facility]. - Current issue now is recent kidney stone, lasted 1-1.5 weeks to pass stone, recently passed. Seemed different than other stones, he was taking Flomax (old rx from 2018) with good results, needs new order. Also has Oxybutynin PRN for bladder spasms, didn't need this med - He now is asking about future new referral to Urologist locally, he is interested in establishing with a Urology practice who can manage kidney stones, and especially if having acute flare up would have treatment, and possibly procedure to remove stone etc as indicated. - Today has no new pain or symptoms Denies hematuria or dysuria   Depression screen [facility] 2/9 09/20/2019 03/21/2019 03/17/2018  Decreased Interest 0 0 0  Down, Depressed, Hopeless 0 0 0  PHQ - 2 Score 0 0 0    Social History   Tobacco Use  . Smoking status: Never Smoker  . Smokeless tobacco: Never Used  Substance Use Topics  . Alcohol use: No  . Drug use: No    Review of Systems Per HPI unless specifically indicated above     Objective:    BP 124/75   Pulse 88   Temp (!) 97.1 F (36.2 C) (Temporal)   Resp 16   Ht 5\' 8"  (1.727 m)   Wt 248 lb 9.6 oz (112.8 kg)   BMI 37.80 kg/m   Wt Readings from Last 3 Encounters:  11/23/19 248 lb 9.6 oz (112.8 kg)  09/20/19 251 lb (113.9 kg)  03/21/19 238 lb 12.8 oz (108.3 kg)    Physical Exam Vitals and nursing note reviewed.  Constitutional:      General: He is not in acute distress.    Appearance: He is  well-developed. He is not diaphoretic.     Comments: Well-appearing, comfortable, cooperative  HENT:     Head: Normocephalic and atraumatic.  Eyes:     General:        Right eye: No discharge.        Left eye: No discharge.     Conjunctiva/sclera: Conjunctivae normal.  Cardiovascular:     Rate and Rhythm: Normal rate.  Pulmonary:     Effort: Pulmonary effort is normal.  Skin:    General: Skin is warm and dry.     Findings: No erythema or rash.  Neurological:     Mental Status: He is alert and oriented to person, place, and time.  Psychiatric:        Behavior: Behavior normal.     Comments: Well groomed, good eye contact, normal speech and thoughts    Results for orders placed or performed in visit on 09/20/19  POCT HgB A1C  Result Value Ref Range   Hemoglobin A1C 7.7 (A) 4.0 - 5.6 %      Assessment & Plan:   Problem List Items Addressed This Visit    None    Visit Diagnoses    Nephrolithiasis    -  Primary   Relevant Medications   tamsulosin (  FLOMAX) 0.4 MG CAPS capsule   oxybutynin (DITROPAN) 5 MG tablet      Clinically resolved acute nephrolithiasis, s/p passed Chronic prior history of recurrent kidney stones Complicated by Chronic MS / Neurogenic bladder incomplete emptying  Will re order Tamsulosin 0.4mg  daily PRN if future stone / also Oxybutynin PRN bladder spasms  Discussed referral to Urology - he will f/u with me if ready or interested to establish locally with [facility]  Meds ordered this encounter  Medications  . tamsulosin (FLOMAX) 0.4 MG CAPS capsule    Sig: Take 1 capsule (0.4 mg total) by mouth daily as needed (kidney stone). 7-14 days as needed    Dispense:  30 capsule    Refill:  1  . oxybutynin (DITROPAN) 5 MG tablet    Sig: Take 1 tablet (5 mg total) by mouth every 8 (eight) hours as needed for bladder spasms.    Dispense:  30 tablet    Refill:  0      Follow up plan: Return if symptoms worsen or fail to improve, for kidney  stones.    Nobie Putnam, [facility] 11/23/2019, 3:32 PM

## 2019-12-06 ENCOUNTER — Other Ambulatory Visit: Payer: Self-pay | Admitting: Family Medicine

## 2019-12-06 DIAGNOSIS — N200 Calculus of kidney: Secondary | ICD-10-CM

## 2019-12-06 NOTE — Telephone Encounter (Signed)
Requested Prescriptions  Pending Prescriptions Disp Refills  . oxybutynin (DITROPAN) 5 MG tablet [Pharmacy Med Name: OXYBUTYNIN 5MG  TABLETS] 90 tablet 0    Sig: TAKE 1 TABLET(5 MG) BY MOUTH EVERY 8 HOURS AS NEEDED FOR BLADDER SPASMS     Urology:  Bladder Agents Passed - 12/06/2019  3:12 AM      Passed - Valid encounter within last 12 months    Recent Outpatient Visits          1 week ago Nephrolithiasis   [place], DO   2 months ago Type 2 diabetes mellitus with other specified complication, with long-term current use of insulin [facility])   [place], DO   6 months ago Spondylosis of thoracic region without myelopathy or radiculopathy   [place], DO   8 months ago Annual physical exam   [facility] Olin Hauser, DO   1 year ago Annual physical exam   [place], DO      Future Appointments            In 3 months Parks Ranger, Devonne Doughty, [facility], [facility]

## 2019-12-08 ENCOUNTER — Other Ambulatory Visit: Payer: Self-pay | Admitting: Family Medicine

## 2019-12-08 DIAGNOSIS — Z794 Long term (current) use of insulin: Secondary | ICD-10-CM

## 2019-12-09 ENCOUNTER — Ambulatory Visit (INDEPENDENT_AMBULATORY_CARE_PROVIDER_SITE_OTHER): Payer: BC Managed Care – PPO | Admitting: Family Medicine

## 2019-12-09 ENCOUNTER — Encounter: Payer: Self-pay | Admitting: Family Medicine

## 2019-12-09 ENCOUNTER — Other Ambulatory Visit: Payer: Self-pay

## 2019-12-09 VITALS — BP 98/66 | HR 79 | Ht 68.0 in | Wt 247.8 lb

## 2019-12-09 DIAGNOSIS — S90851A Superficial foreign body, right foot, initial encounter: Secondary | ICD-10-CM

## 2019-12-09 DIAGNOSIS — L089 Local infection of the skin and subcutaneous tissue, unspecified: Secondary | ICD-10-CM | POA: Diagnosis not present

## 2019-12-09 DIAGNOSIS — E1169 Type 2 diabetes mellitus with other specified complication: Secondary | ICD-10-CM | POA: Diagnosis not present

## 2019-12-09 DIAGNOSIS — Z23 Encounter for immunization: Secondary | ICD-10-CM

## 2019-12-09 DIAGNOSIS — Z794 Long term (current) use of insulin: Secondary | ICD-10-CM

## 2019-12-09 HISTORY — DX: Superficial foreign body, right foot, initial encounter: S90.851A

## 2019-12-09 MED ORDER — SULFAMETHOXAZOLE-TRIMETHOPRIM 800-160 MG PO TABS: 1.0000 | ORAL_TABLET | Freq: Two times a day (BID) | ORAL | 0 refills | Status: AC

## 2019-12-09 NOTE — Assessment & Plan Note (Signed)
Due for TDAP immunization based on recent foreign body in right foot.  Side effects and benefits reviewed and VIS provided.  Plan: 1. TDAP vaccination given

## 2019-12-09 NOTE — Patient Instructions (Signed)
We have removed a foreign body from the base of your right foot.  I have sent in a prescription for Bactrim DS to take 1 tablet 2x per day for the next 5 days to prevent any infection.  We have given you a TDAP vaccination while in clinic today.  We will plan to see you back if your symptoms worsen or fail to improve with current treatment  You will receive a survey after today's visit either digitally by e-mail or paper by USPS mail. Your experiences and feedback matter to [place].  Please respond so we know how we are doing as we provide care for you.  Call us with any questions/concerns/needs.  It is my goal to be available to you for your health concerns.  Thanks for choosing me to be a partner in your healthcare needs!  Harlin Rain, FNP-C Family Nurse Practitioner [facility] Phone: [phone]

## 2019-12-09 NOTE — Progress Notes (Signed)
Subjective:    Patient ID: Miguel [name], male    DOB: 01/10/1971, 49 y.o.   MRN: [id number]  Miguel [name] is a 49 y.o. male presenting on 12/09/2019 for Foreign Body in Skin (in the bottom of the Rt foot between the great toe and the second toe  x 2 weeks )   HPI  Foreign body in right foot at base of great toe with callus formation noted.  Patient states noticed it approximately 2-3 weeks prior but symptoms had waxed and waned.  Has noticed that he has been walking on the outside of his foot for the past few days.  Denies fevers, numbness, tingling, weakness, loss of ROM, drainage from this area.  He has tried to use a drawing salve for a few days without relief of symptoms.  Depression screen [facility] 2/9 09/20/2019 03/21/2019 03/17/2018  Decreased Interest 0 0 0  Down, Depressed, Hopeless 0 0 0  PHQ - 2 Score 0 0 0    Social History   Tobacco Use  . Smoking status: Never Smoker  . Smokeless tobacco: Never Used  Substance Use Topics  . Alcohol use: No  . Drug use: No    Review of Systems  Constitutional: Negative.   HENT: Negative.   Eyes: Negative.   Respiratory: Negative.   Cardiovascular: Negative.   Gastrointestinal: Negative.   Endocrine: Negative.   Genitourinary: Negative.   Musculoskeletal: Negative.   Skin: Positive for wound.  Allergic/Immunologic: Negative.   Neurological: Negative.   Hematological: Negative.   Psychiatric/Behavioral: Negative.    Per HPI unless specifically indicated above     Objective:    BP 98/66 (BP Location: Left Arm, Patient Position: Sitting, Cuff Size: Large)   Pulse 79   Ht 5\' 8"  (1.727 m)   Wt (!) 247 lb 12.8 oz (112.4 kg)   BMI 37.68 kg/m   Wt Readings from Last 3 Encounters:  12/09/19 (!) 247 lb 12.8 oz (112.4 kg)  11/23/19 248 lb 9.6 oz (112.8 kg)  09/20/19 251 lb (113.9 kg)    Physical Exam Vitals reviewed.  Constitutional:      General: He is not in acute distress.    Appearance: Normal appearance. He  is well-developed and well-groomed. He is not ill-appearing or toxic-appearing.  HENT:     Head: Normocephalic and atraumatic.     Nose:     Comments: Lizbeth Bark is in place, covering mouth and nose. Eyes:     General:        Right eye: No discharge.        Left eye: No discharge.     Extraocular Movements: Extraocular movements intact.     Conjunctiva/sclera: Conjunctivae normal.     Pupils: Pupils are equal, round, and reactive to light.  Cardiovascular:     Pulses: Normal pulses.  Pulmonary:     Effort: Pulmonary effort is normal. No respiratory distress.  Musculoskeletal:     Right lower leg: No edema.     Left lower leg: No edema.  Skin:    General: Skin is warm and dry.     Capillary Refill: Capillary refill takes less than 2 seconds.     Findings: Wound present.     Comments: Base of right foot great toe with callused lesion.  Foreign body, piece of glass removed in clinic  Neurological:     General: No focal deficit present.     Mental Status: He is alert and oriented to person,  place, and time.  Psychiatric:        Attention and Perception: Attention and perception normal.        Mood and Affect: Mood and affect normal.        Speech: Speech normal.        Behavior: Behavior normal. Behavior is cooperative.        Thought Content: Thought content normal.        Cognition and Memory: Cognition and memory normal.     Results for orders placed or performed in visit on 09/20/19  POCT HgB A1C  Result Value Ref Range   Hemoglobin A1C 7.7 (A) 4.0 - 5.6 %      Assessment & Plan:   Problem List Items Addressed This Visit      Endocrine   Type 2 diabetes mellitus with other specified complication (HCC)   Relevant Medications   sulfamethoxazole-trimethoprim (BACTRIM DS) 800-160 MG tablet     Other   Foreign body in right foot    Foreign body in right foot at base of great toe with callus formation noted.  Upon evaluation of area was able to see small dark foreign  body and able to evacuate small piece of glass from callused lesion with 25g needle.  Discussed importance of TDAP vaccination in clinic today and to cover with an antibiotic as he has diabetes.  In agreement for TDAP vaccination and will start on Bactrim DS 1 tab 2x daily x 5 days.  Plan: 1. Begin bactrim DS 1 tablet 2x per day for the next 5 days 2. TDAP vaccination given 3. RTC if symptoms worsen or fail to improve      Immunization due    Due for TDAP immunization based on recent foreign body in right foot.  Side effects and benefits reviewed and VIS provided.  Plan: 1. TDAP vaccination given       Other Visit Diagnoses    Skin infection    -  Primary   Relevant Medications   sulfamethoxazole-trimethoprim (BACTRIM DS) 800-160 MG tablet   Need for diphtheria-tetanus-pertussis (Tdap) vaccine       Relevant Orders   Tdap vaccine greater than or equal to 7yo IM (Completed)      Meds ordered this encounter  Medications  . sulfamethoxazole-trimethoprim (BACTRIM DS) 800-160 MG tablet    Sig: Take 1 tablet by mouth 2 (two) times daily for 5 days.    Dispense:  10 tablet    Refill:  0      Follow up plan: Return if symptoms worsen or fail to improve.   Harlin Rain, [place] Family Nurse Practitioner [facility] 12/09/2019, 5:17 PM

## 2019-12-09 NOTE — Assessment & Plan Note (Signed)
Foreign body in right foot at base of great toe with callus formation noted.  Upon evaluation of area was able to see small dark foreign body and able to evacuate small piece of glass from callused lesion with 25g needle.  Discussed importance of TDAP vaccination in clinic today and to cover with an antibiotic as he has diabetes.  In agreement for TDAP vaccination and will start on Bactrim DS 1 tab 2x daily x 5 days.  Plan: 1. Begin bactrim DS 1 tablet 2x per day for the next 5 days 2. TDAP vaccination given 3. RTC if symptoms worsen or fail to improve

## 2019-12-21 ENCOUNTER — Other Ambulatory Visit: Payer: Self-pay | Admitting: Family Medicine

## 2019-12-21 DIAGNOSIS — Z794 Long term (current) use of insulin: Secondary | ICD-10-CM

## 2019-12-26 ENCOUNTER — Telehealth: Payer: Self-pay | Admitting: Family Medicine

## 2019-12-26 DIAGNOSIS — Z794 Long term (current) use of insulin: Secondary | ICD-10-CM

## 2019-12-26 MED ORDER — OZEMPIC (1 MG/DOSE) 2 MG/1.5ML ~~LOC~~ SOPN: 1.0000 mg | PEN_INJECTOR | SUBCUTANEOUS | 1 refills | Status: DC

## 2019-12-26 NOTE — Addendum Note (Signed)
Addended by: Olin Hauser on: 12/26/2019 04:57 PM   Modules accepted: Orders

## 2019-12-26 NOTE — Telephone Encounter (Signed)
Please let the patient know:  Sent refill to [facility] for Ozempic 1mg  weekly dose.  90 day with refill  He asked about increasing dose if it is available. I have checked on the status and it is currently NOT available for higher doses.  The [place] [place] has not come out with a higher dose yet, even though they have told us that they were working on a new dose.  Nobie Putnam, [facility] 12/26/2019, 4:57 PM

## 2019-12-26 NOTE — Telephone Encounter (Signed)
Pt is requesting a refill on ozempic ( he is also requesting dose to be increased if available ) [facility] in [place] . Pt call back # [phone]

## 2019-12-27 NOTE — Telephone Encounter (Signed)
Patient notified

## 2020-01-04 ENCOUNTER — Other Ambulatory Visit: Payer: Self-pay | Admitting: Family Medicine

## 2020-01-04 DIAGNOSIS — E0800 Diabetes mellitus due to underlying condition with hyperosmolarity without nonketotic hyperglycemic-hyperosmolar coma (NKHHC): Secondary | ICD-10-CM

## 2020-01-04 DIAGNOSIS — E783 Hyperchylomicronemia: Secondary | ICD-10-CM

## 2020-01-04 NOTE — Telephone Encounter (Signed)
Requested medications are due for refill today? Yes  Requested medications are on active medication list?  Yes  Last Refill:   07/04/2019  # 90 with one refill   Future visit scheduled?  Yes   Notes to Clinic:  Medication failed RX refill protocol due to no labs within the past 180 days.   Last Creatinine an potassium were performed on 03/15/2019.

## 2020-01-04 NOTE — Telephone Encounter (Signed)
Requested Prescriptions  Pending Prescriptions Disp Refills  . lisinopril (ZESTRIL) 2.5 MG tablet [Pharmacy Med Name: LISINOPRIL 2.5MG  TABLETS] 90 tablet 1    Sig: TAKE 1 TABLET(2.5 MG) BY MOUTH DAILY     Cardiovascular:  ACE Inhibitors Failed - 01/04/2020  3:11 AM      Failed - Cr in normal range and within 180 days    Creat  Date Value Ref Range Status  03/15/2019 0.81 0.60 - 1.35 mg/dL Final         Failed - K in normal range and within 180 days    Potassium  Date Value Ref Range Status  03/15/2019 4.1 3.5 - 5.3 mmol/L Final         Passed - Patient is not pregnant      Passed - Last BP in normal range    BP Readings from Last 1 Encounters:  12/09/19 98/66         Passed - Valid encounter within last 6 months    Recent Outpatient Visits          3 weeks ago Skin infection   [facility], Lupita Raider, FNP   1 month ago Nephrolithiasis   [place], DO   3 months ago Type 2 diabetes mellitus with other specified complication, with long-term current use of insulin [facility])   [place], DO   7 months ago Spondylosis of thoracic region without myelopathy or radiculopathy   [place], DO   9 months ago Annual physical exam   [place], DO      Future Appointments            In 2 months Parks Ranger, Devonne Doughty, [facility], [place]           . atorvastatin (LIPITOR) 20 MG tablet [Pharmacy Med Name: ATORVASTATIN 20MG  TABLETS] 90 tablet 0    Sig: TAKE 1 TABLET(20 MG) BY MOUTH DAILY     Cardiovascular:  Antilipid - Statins Failed - 01/04/2020  3:11 AM      Failed - HDL in normal range and within 360 days    HDL  Date Value Ref Range Status  03/15/2019 32 (L) > OR = 40 mg/dL Final         Passed - Total Cholesterol in normal range and within 360 days    Cholesterol   Date Value Ref Range Status  03/15/2019 149 <200 mg/dL Final         Passed - LDL in normal range and within 360 days    LDL Cholesterol (Calc)  Date Value Ref Range Status  03/15/2019 96 mg/dL (calc) Final    Comment:    Reference range: <100 . Desirable range <100 mg/dL for primary prevention;   <70 mg/dL for patients with CHD or diabetic patients  with > or = 2 CHD risk factors. Marland Kitchen LDL-C is now calculated using the Martin-Hopkins  calculation, which is a validated novel method providing  better accuracy than the Friedewald equation in the  estimation of LDL-C.  Cresenciano Genre et al. Annamaria Helling. 1761;607(37): 2061-2068  (http://education.[employer].com/faq/FAQ164)          Passed - Triglycerides in normal range and within 360 days    Triglycerides  Date Value Ref Range Status  03/15/2019 110 <150 mg/dL Final         Passed - Patient  is not pregnant      Passed - Valid encounter within last 12 months    Recent Outpatient Visits          3 weeks ago Skin infection   [facility], Lupita Raider, FNP   1 month ago Nephrolithiasis   [facility] [place], Devonne Doughty, DO   3 months ago Type 2 diabetes mellitus with other specified complication, with long-term current use of insulin [facility])   [place], DO   7 months ago Spondylosis of thoracic region without myelopathy or radiculopathy   [place], DO   9 months ago Annual physical exam   [place], DO      Future Appointments            In 2 months Parks Ranger, Devonne Doughty, [facility], [facility]

## 2020-02-06 ENCOUNTER — Other Ambulatory Visit: Payer: Self-pay | Admitting: Family Medicine

## 2020-02-06 DIAGNOSIS — N200 Calculus of kidney: Secondary | ICD-10-CM

## 2020-03-14 ENCOUNTER — Other Ambulatory Visit: Payer: Self-pay | Admitting: *Deleted

## 2020-03-14 DIAGNOSIS — Z125 Encounter for screening for malignant neoplasm of prostate: Secondary | ICD-10-CM

## 2020-03-14 DIAGNOSIS — Z794 Long term (current) use of insulin: Secondary | ICD-10-CM

## 2020-03-14 DIAGNOSIS — E1169 Type 2 diabetes mellitus with other specified complication: Secondary | ICD-10-CM

## 2020-03-14 DIAGNOSIS — Z0000 Encounter for general adult medical examination without abnormal findings: Secondary | ICD-10-CM

## 2020-03-14 DIAGNOSIS — G35 Multiple sclerosis: Secondary | ICD-10-CM

## 2020-03-15 ENCOUNTER — Other Ambulatory Visit: Payer: Self-pay

## 2020-03-15 ENCOUNTER — Other Ambulatory Visit: Payer: BC Managed Care – PPO

## 2020-03-16 LAB — LIPID PANEL
Cholesterol: 184 mg/dL (ref <200)
HDL: 33 mg/dL — ABNORMAL LOW (ref >=40)
LDL Cholesterol (Calc): 119 mg/dL (calc) — ABNORMAL HIGH
Non-HDL Cholesterol (Calc): 151 mg/dL (calc) — ABNORMAL HIGH (ref <130)
Total CHOL/HDL Ratio: 5.6 (calc) — ABNORMAL HIGH (ref <5.0)
Triglycerides: 202 mg/dL — ABNORMAL HIGH (ref <150)

## 2020-03-16 LAB — COMPLETE METABOLIC PANEL WITH GFR
AG Ratio: 2 (calc) (ref 1.0–2.5)
ALT: 37 U/L (ref 9–46)
AST: 24 U/L (ref 10–40)
Albumin: 4.7 g/dL (ref 3.6–5.1)
Alkaline phosphatase (APISO): 91 U/L (ref 36–130)
BUN: 13 mg/dL (ref 7–25)
CO2: 27 mmol/L (ref 20–32)
Calcium: 9.9 mg/dL (ref 8.6–10.3)
Chloride: 99 mmol/L (ref 98–110)
Creat: 0.76 mg/dL (ref 0.60–1.35)
GFR, Est African American: 125 mL/min/{1.73_m2} (ref >=60)
GFR, Est Non African American: 108 mL/min/{1.73_m2} (ref >=60)
Globulin: 2.3 g/dL (calc) (ref 1.9–3.7)
Glucose, Bld: 199 mg/dL — ABNORMAL HIGH (ref 65–99)
Potassium: 4.4 mmol/L (ref 3.5–5.3)
Sodium: 137 mmol/L (ref 135–146)
Total Bilirubin: 1.4 mg/dL — ABNORMAL HIGH (ref 0.2–1.2)
Total Protein: 7 g/dL (ref 6.1–8.1)

## 2020-03-16 LAB — HEMOGLOBIN A1C
Hgb A1c MFr Bld: 8.5 % of total Hgb — ABNORMAL HIGH (ref <5.7)
Mean Plasma Glucose: 197 (calc)
eAG (mmol/L): 10.9 (calc)

## 2020-03-16 LAB — CBC WITH DIFFERENTIAL/PLATELET
Absolute Monocytes: 499 cells/uL (ref 200–950)
Basophils Absolute: 19 cells/uL (ref 0–200)
Basophils Relative: 0.3 %
Eosinophils Absolute: 90 cells/uL (ref 15–500)
Eosinophils Relative: 1.4 %
HCT: 50.6 % — ABNORMAL HIGH (ref 38.5–50.0)
Hemoglobin: 16.9 g/dL (ref 13.2–17.1)
Lymphs Abs: 1280 cells/uL (ref 850–3900)
MCH: 31.1 pg (ref 27.0–33.0)
MCHC: 33.4 g/dL (ref 32.0–36.0)
MCV: 93 fL (ref 80.0–100.0)
MPV: 10.9 fL (ref 7.5–12.5)
Monocytes Relative: 7.8 %
Neutro Abs: 4512 cells/uL (ref 1500–7800)
Neutrophils Relative %: 70.5 %
Platelets: 244 10*3/uL (ref 140–400)
RBC: 5.44 10*6/uL (ref 4.20–5.80)
RDW: 11.8 % (ref 11.0–15.0)
Total Lymphocyte: 20 %
WBC: 6.4 10*3/uL (ref 3.8–10.8)

## 2020-03-16 LAB — PSA: PSA: 0.35 ng/mL (ref <=4.0)

## 2020-03-16 LAB — TSH: TSH: 1.48 mIU/L (ref 0.40–4.50)

## 2020-03-22 ENCOUNTER — Ambulatory Visit (INDEPENDENT_AMBULATORY_CARE_PROVIDER_SITE_OTHER): Payer: BC Managed Care – PPO | Admitting: Family Medicine

## 2020-03-22 ENCOUNTER — Other Ambulatory Visit: Payer: Self-pay

## 2020-03-22 ENCOUNTER — Encounter: Payer: Self-pay | Admitting: Family Medicine

## 2020-03-22 VITALS — BP 119/74 | HR 83 | Temp 97.5°F | Resp 16 | Ht 68.0 in | Wt 247.0 lb

## 2020-03-22 DIAGNOSIS — Z23 Encounter for immunization: Secondary | ICD-10-CM | POA: Diagnosis not present

## 2020-03-22 DIAGNOSIS — E1169 Type 2 diabetes mellitus with other specified complication: Secondary | ICD-10-CM

## 2020-03-22 DIAGNOSIS — E783 Hyperchylomicronemia: Secondary | ICD-10-CM | POA: Diagnosis not present

## 2020-03-22 DIAGNOSIS — G4733 Obstructive sleep apnea (adult) (pediatric): Secondary | ICD-10-CM

## 2020-03-22 DIAGNOSIS — Z0000 Encounter for general adult medical examination without abnormal findings: Secondary | ICD-10-CM | POA: Diagnosis not present

## 2020-03-22 DIAGNOSIS — Z794 Long term (current) use of insulin: Secondary | ICD-10-CM

## 2020-03-22 DIAGNOSIS — E785 Hyperlipidemia, unspecified: Secondary | ICD-10-CM

## 2020-03-22 DIAGNOSIS — Z9989 Dependence on other enabling machines and devices: Secondary | ICD-10-CM

## 2020-03-22 MED ORDER — ATORVASTATIN CALCIUM 20 MG PO TABS: 20.0000 mg | ORAL_TABLET | Freq: Every day | ORAL | 3 refills | Status: DC

## 2020-03-22 NOTE — Assessment & Plan Note (Signed)
Elevated LDL Last lipid panel 02/2020 The 10-year ASCVD risk score Mikey Bussing DC Jr., et al., 2013) is: 6.6%   Plan: 1. Continue current meds - Atorvastatin 20mg  daily 2. Continue ASA 81mg  for primary ASCVD risk reduction 3. Encourage improved lifestyle - low carb/cholesterol, reduce portion size, continue improving regular exercise 4. Follow-up lipids yearly

## 2020-03-22 NOTE — Progress Notes (Signed)
Subjective:    Patient ID: Miguel [name], male    DOB: 1970-05-30, 49 y.o.   MRN: [id number]  Miguel [name] is a 49 y.o. male presenting on 03/22/2020 for Annual Exam   HPI   Here for Annual Physical and Lab Review  FOLLOW-UPCHRONIC DM, Type 2: See prior notes for background information. He is doing well on Ozempic 1mg . He admits that steroid bag infusions often increases sugar. He has had some diet changes due to limited on groceries pandemic. - Last A1c up to 8.5 (prior 6-7 range) CBG log: [id number] Meds- - Ozempic1mg  [place] weekly- admits some constipation related -Basaglar at 18u - taking nightly, often he increases dose with infusions. He may switch dosing. Currently on ACEi Lifestyle: Weight stable at 247 lbs - Diet (admits some poor diet lately) - Exercise (Limited exercisemore recently) - Last DM Eye 08/2019 Denies hypoglycemia  Chronic Neuropathic Pain /Back Pain / Multiple Sclerosis Followed by [facility]. See outside record for details. Continues on disease modifying therapy infusion every few months, rarely has flare up requiring bag steroid infusion. - Taking Gabapentin, Baclofen, Tramadol - temporary relief - Has been receiving spinal injections as welL Now improved muscle relaxant Tizanidine 4mg  initially took fairly regularly then now takes half tab occasionally 1-2 times week, prevents flare up with back.  HYPERLIPIDEMIA: - Reports no concerns. Last lipid panel10/2021,stable from previous, controlled LDL - Currently takingAtorvastatin 20mg  daily, tolerating well without side effects or myalgias  OSA on CPAP  - Patient reports prior history of dx OSA and on CPAP for years, prior to treatment initial symptoms were snoring, daytime sleepiness and fatigue, has had several sleep studies in the past. - Today reports that sleep apnea is well controlled. He uses the CPAP machine every night. Tolerates the machine well, and thinks that  sleeps better with it and feels good. No new concerns or symptoms.    Health Maintenance:  Due for Flu shot  Depression screen [facility] 2/9 03/22/2020 09/20/2019 03/21/2019  Decreased Interest 0 0 0  Down, Depressed, Hopeless 0 0 0  PHQ - 2 Score 0 0 0    Past Medical History:  Diagnosis Date  . Allergy   . Sleep apnea    Past Surgical History:  Procedure Laterality Date  . HERNIA REPAIR     Social History   Socioeconomic History  . Marital status: Married    Spouse name: Not on file  . Number of children: Not on file  . Years of education: Not on file  . Highest education level: Not on file  Occupational History  . Not on file  Tobacco Use  . Smoking status: Never Smoker  . Smokeless tobacco: Never Used  Substance and Sexual Activity  . Alcohol use: No  . Drug use: No  . Sexual activity: Not on file  Other Topics Concern  . Not on file  Social History Narrative  . Not on file   Social Determinants of Health   Financial Resource Strain:   . Difficulty of Paying Living Expenses: Not on file  Food Insecurity:   . Worried About Charity fundraiser in the Last Year: Not on file  . Ran Out of Food in the Last Year: Not on file  Transportation Needs:   . Lack of Transportation (Medical): Not on file  . Lack of Transportation (Non-Medical): Not on file  Physical Activity:   . Days of Exercise per Week: Not on file  .  Minutes of Exercise per Session: Not on file  Stress:   . Feeling of Stress : Not on file  Social Connections:   . Frequency of Communication with Friends and Family: Not on file  . Frequency of Social Gatherings with Friends and Family: Not on file  . Attends Religious Services: Not on file  . Active Member of Clubs or Organizations: Not on file  . Attends Archivist Meetings: Not on file  . Marital Status: Not on file  Intimate Partner Violence:   . Fear of Current or Ex-Partner: Not on file  . Emotionally Abused: Not on file  .  Physically Abused: Not on file  . Sexually Abused: Not on file   Family History  Problem Relation Age of Onset  . Colon cancer Neg Hx   . Prostate cancer Neg Hx    Current Outpatient Medications on File Prior to Visit  Medication Sig  . B-D UF III MINI PEN NEEDLES 31G X 5 MM MISC USE AS DIRECTED ONCE DAILY  . Cholecalciferol (D 1000) 1000 units capsule Take by mouth.   . Cyanocobalamin (VITAMIN B-12) 5000 MCG SUBL Place under the tongue.  Marland Kitchen desonide (DESOWEN) 0.05 % cream Apply topically daily as needed.  . gabapentin (NEURONTIN) 300 MG capsule Take 300 mg by mouth continuous as needed.   Marland Kitchen ibuprofen (ADVIL,MOTRIN) 800 MG tablet Take 800 mg by mouth every 6 (six) hours as needed.   . Insulin Glargine (BASAGLAR KWIKPEN) 100 UNIT/ML Inject 18 Units into the skin at bedtime.  Marland Kitchen ketoconazole (NIZORAL) 2 % shampoo Apply 1 application topically daily as needed.  . Lancets (ONETOUCH ULTRASOFT) lancets Check blood sugar three times daily.  Marland Kitchen lisinopril (ZESTRIL) 2.5 MG tablet TAKE 1 TABLET(2.5 MG) BY MOUTH DAILY  . Ocrelizumab (OCREVUS IV) Inject into the vein.  Glory Rosebush ULTRA test strip USE TO CHECK BLOOD SUGAR THREE TIMES DAILY AS DIRECTED  . oxybutynin (DITROPAN) 5 MG tablet TAKE 1 TABLET(5 MG) BY MOUTH EVERY 8 HOURS AS NEEDED FOR BLADDER SPASMS  . OZEMPIC, 1 MG/DOSE, 2 MG/1.5ML SOPN Inject 0.75 mLs (1 mg total) as directed once a week.  . tamsulosin (FLOMAX) 0.4 MG CAPS capsule TAKE ONE CAPSULE BY MOUTH DAILY AS NEEDED FOR KIDNEY STONE FOR 7 TO 14 DAYS  . tiZANidine (ZANAFLEX) 4 MG tablet TAKE 1/2 TO 1 TABLET(2 TO 4 MG) BY MOUTH EVERY 8 HOURS AS NEEDED FOR MUSCLE SPASMS   No current facility-administered medications on file prior to visit.    Review of Systems  Constitutional: Negative for activity change, appetite change, chills, diaphoresis, fatigue and fever.  HENT: Negative for congestion and hearing loss.   Eyes: Negative for visual disturbance.  Respiratory: Negative for cough,  chest tightness, shortness of breath and wheezing.   Cardiovascular: Negative for chest pain, palpitations and leg swelling.  Gastrointestinal: Negative for abdominal pain, anal bleeding, blood in stool, constipation, diarrhea, nausea and vomiting.  Endocrine: Negative for cold intolerance.  Genitourinary: Negative for dysuria, frequency and hematuria.  Musculoskeletal: Negative for arthralgias and neck pain.  Skin: Negative for rash.  Allergic/Immunologic: Negative for environmental allergies.  Neurological: Negative for dizziness, weakness, light-headedness, numbness and headaches.  Hematological: Negative for adenopathy.  Psychiatric/Behavioral: Negative for behavioral problems, dysphoric mood and sleep disturbance. The patient is not nervous/anxious.    Per HPI unless specifically indicated above     Objective:    BP 119/74   Pulse 83   Temp (!) 97.5 F (36.4 C) (Temporal)  Resp 16   Ht 5\' 8"  (1.727 m)   Wt 247 lb (112 kg)   SpO2 99%   BMI 37.56 kg/m   Wt Readings from Last 3 Encounters:  03/22/20 247 lb (112 kg)  12/09/19 (!) 247 lb 12.8 oz (112.4 kg)  11/23/19 248 lb 9.6 oz (112.8 kg)    Physical Exam Vitals and nursing note reviewed.  Constitutional:      General: He is not in acute distress.    Appearance: He is well-developed. He is not diaphoretic.     Comments: Well-appearing, comfortable, cooperative  HENT:     Head: Normocephalic and atraumatic.  Eyes:     General:        Right eye: No discharge.        Left eye: No discharge.     Conjunctiva/sclera: Conjunctivae normal.     Pupils: Pupils are equal, round, and reactive to light.  Neck:     Thyroid: No thyromegaly.  Cardiovascular:     Rate and Rhythm: Normal rate and regular rhythm.     Heart sounds: Normal heart sounds. No murmur heard.   Pulmonary:     Effort: Pulmonary effort is normal. No respiratory distress.     Breath sounds: Normal breath sounds. No wheezing or rales.  Abdominal:      General: Bowel sounds are normal. There is no distension.     Palpations: Abdomen is soft. There is no mass.     Tenderness: There is no abdominal tenderness.  Musculoskeletal:        General: No tenderness. Normal range of motion.     Cervical back: Normal range of motion and neck supple.     Comments: Upper / Lower Extremities: - Normal muscle tone, strength bilateral upper extremities 5/5, lower extremities 5/5  Lymphadenopathy:     Cervical: No cervical adenopathy.  Skin:    General: Skin is warm and dry.     Findings: No erythema or rash.  Neurological:     Mental Status: He is alert and oriented to person, place, and time.     Comments: Distal sensation intact to light touch all extremities  Psychiatric:        Behavior: Behavior normal.     Comments: Well groomed, good eye contact, normal speech and thoughts      Diabetic Foot Exam - Simple   Simple Foot Form Diabetic Foot exam was performed with the following findings: Yes 03/22/2020  8:30 AM  Visual Inspection See comments: Yes Sensation Testing Intact to touch and monofilament testing bilaterally: Yes Pulse Check Posterior Tibialis and Dorsalis pulse intact bilaterally: Yes Comments Bilateral feet with callus formation, great toe and heels, dry skin. No ulceration.     Results for orders placed or performed in visit on 03/14/20  TSH  Result Value Ref Range   TSH 1.48 0.40 - 4.50 mIU/L  PSA  Result Value Ref Range   PSA 0.35 < OR = 4.0 ng/mL  Lipid panel  Result Value Ref Range   Cholesterol 184 <200 mg/dL   HDL 33 (L) > OR = 40 mg/dL   Triglycerides 202 (H) <150 mg/dL   LDL Cholesterol (Calc) 119 (H) mg/dL (calc)   Total CHOL/HDL Ratio 5.6 (H) <5.0 (calc)   Non-HDL Cholesterol (Calc) 151 (H) <130 mg/dL (calc)  COMPLETE METABOLIC PANEL WITH GFR  Result Value Ref Range   Glucose, Bld 199 (H) 65 - 99 mg/dL   BUN 13 7 - 25 mg/dL  Creat 0.76 0.60 - 1.35 mg/dL   GFR, Est Non African American 108 > OR = 60  mL/min/1.8m2   GFR, Est African American 125 > OR = 60 mL/min/1.73m2   BUN/Creatinine Ratio NOT APPLICABLE 6 - 22 (calc)   Sodium 137 135 - 146 mmol/L   Potassium 4.4 3.5 - 5.3 mmol/L   Chloride 99 98 - 110 mmol/L   CO2 27 20 - 32 mmol/L   Calcium 9.9 8.6 - 10.3 mg/dL   Total Protein 7.0 6.1 - 8.1 g/dL   Albumin 4.7 3.6 - 5.1 g/dL   Globulin 2.3 1.9 - 3.7 g/dL (calc)   AG Ratio 2.0 1.0 - 2.5 (calc)   Total Bilirubin 1.4 (H) 0.2 - 1.2 mg/dL   Alkaline phosphatase (APISO) 91 36 - 130 U/L   AST 24 10 - 40 U/L   ALT 37 9 - 46 U/L  CBC with Differential/Platelet  Result Value Ref Range   WBC 6.4 3.8 - 10.8 Thousand/uL   RBC 5.44 4.20 - 5.80 Million/uL   Hemoglobin 16.9 13.2 - 17.1 g/dL   HCT 50.6 (H) 38 - 50 %   MCV 93.0 80.0 - 100.0 fL   MCH 31.1 27.0 - 33.0 pg   MCHC 33.4 32.0 - 36.0 g/dL   RDW 11.8 11.0 - 15.0 %   Platelets 244 140 - 400 Thousand/uL   MPV 10.9 7.5 - 12.5 fL   Neutro Abs 4,512 1,500 - 7,800 cells/uL   Lymphs Abs 1,280 850 - 3,900 cells/uL   Absolute Monocytes 499 200 - 950 cells/uL   Eosinophils Absolute 90 15.0 - 500.0 cells/uL   Basophils Absolute 19 0.0 - 200.0 cells/uL   Neutrophils Relative % 70.5 %   Total Lymphocyte 20.0 %   Monocytes Relative 7.8 %   Eosinophils Relative 1.4 %   Basophils Relative 0.3 %  Hemoglobin A1c  Result Value Ref Range   Hgb A1c MFr Bld 8.5 (H) <5.7 % of total Hgb   Mean Plasma Glucose 197 (calc)   eAG (mmol/L) 10.9 (calc)      Assessment & Plan:   Problem List Items Addressed This Visit    Type 2 diabetes mellitus with other specified complication (HCC)    Elevated A1c to 8.5 On treatment for MS. Rare steroid infusion for MS flare (hyperglycemia with steroids) No evidence of hypoglycemia on GLP and Insulin Failed Metformin 1000mg  BID (GI intolerance), XR was ineffective Complications - other including dyslipidemia with low HDL and history of hypertriglyceridemia, obesity, OSA - increases risk of future cardiovascular  complications / poor glucose control due to OSA  Plan:  1. Continue current max dose Ozempic 1mg  weekly [place] - future will anticipate dose adjust to 1.5 or 2mg  in future once approved for that dosing indication - Adjust basaglar insulin from 18 to 20u can do split dosing 10 BID or adjust dosing 2. Encourage improved lifestyle - low carb, low sugar diet, reduce portion size, continue improving regular exercise 3. Check CBG, bring log to next visit for review again 4. Continue Statin, ACE DM Foot  F/u 4 month - discuss other options possible SGLT2 in future      Relevant Medications   Insulin Glargine (BASAGLAR KWIKPEN) 100 UNIT/ML   atorvastatin (LIPITOR) 20 MG tablet   OSA on CPAP    Well controlled, chronic OSA on CPAP - Good adherence to CPAP nightly - Continue current CPAP therapy, patient seems to be benefiting from therapy  Hyperlipidemia associated with type 2 diabetes mellitus ([place])    Elevated LDL Last lipid panel 02/2020 The 10-year ASCVD risk score Mikey Bussing DC Jr., et al., 2013) is: 6.6%   Plan: 1. Continue current meds - Atorvastatin 20mg  daily 2. Continue ASA 81mg  for primary ASCVD risk reduction 3. Encourage improved lifestyle - low carb/cholesterol, reduce portion size, continue improving regular exercise 4. Follow-up lipids yearly      Relevant Medications   Insulin Glargine (BASAGLAR KWIKPEN) 100 UNIT/ML   atorvastatin (LIPITOR) 20 MG tablet    Other Visit Diagnoses    Annual physical exam    -  Primary   Needs flu shot       Relevant Orders   Flu Vaccine QUAD 36+ mos IM (Completed)   Hyperchylomicronemia       Relevant Medications   atorvastatin (LIPITOR) 20 MG tablet      Updated Health Maintenance information - Flu shot today Reviewed recent lab results with patient Encouraged improvement to lifestyle with diet and exercise - Goal of weight loss    Meds ordered this encounter  Medications  . atorvastatin (LIPITOR) 20 MG tablet    Sig:  Take 1 tablet (20 mg total) by mouth at bedtime.    Dispense:  90 tablet    Refill:  3      Follow up plan: Return in about 4 months (around 07/20/2020) for 4 month DM A1c.  Nobie Putnam, DO [facility] 03/22/2020, 8:12 AM

## 2020-03-22 NOTE — Assessment & Plan Note (Signed)
Elevated A1c to 8.5 On treatment for MS. Rare steroid infusion for MS flare (hyperglycemia with steroids) No evidence of hypoglycemia on GLP and Insulin Failed Metformin 1000mg  BID (GI intolerance), XR was ineffective Complications - other including dyslipidemia with low HDL and history of hypertriglyceridemia, obesity, OSA - increases risk of future cardiovascular complications / poor glucose control due to OSA  Plan:  1. Continue current max dose Ozempic 1mg  weekly [place] - future will anticipate dose adjust to 1.5 or 2mg  in future once approved for that dosing indication - Adjust basaglar insulin from 18 to 20u can do split dosing 10 BID or adjust dosing 2. Encourage improved lifestyle - low carb, low sugar diet, reduce portion size, continue improving regular exercise 3. Check CBG, bring log to next visit for review again 4. Continue Statin, ACE DM Foot  F/u 4 month - discuss other options possible SGLT2 in future

## 2020-03-22 NOTE — Assessment & Plan Note (Signed)
Well controlled, chronic OSA on CPAP - Good adherence to CPAP nightly - Continue current CPAP therapy, patient seems to be benefiting from therapy  

## 2020-03-22 NOTE — Patient Instructions (Addendum)
Thank you for coming to the office today.  Adjust insulin may do 10u twice a day split dosing, or can adjust time of dose Improve diet and exercise  We can consider other medicines next time like [place] / Jardiance.  Future may have higher ozempic dose   Please schedule a Follow-up Appointment to: Return in about 4 months (around 07/20/2020) for 4 month DM A1c.  If you have any other questions or concerns, please feel free to call the office or send a message through [place]. You may also schedule an earlier appointment if necessary.  Additionally, you may be receiving a survey about your experience at our office within a few days to 1 week by e-mail or mail. We value your feedback.  Nobie Putnam, DO [place]

## 2020-05-26 ENCOUNTER — Other Ambulatory Visit: Payer: Self-pay | Admitting: Family Medicine

## 2020-05-26 DIAGNOSIS — Z794 Long term (current) use of insulin: Secondary | ICD-10-CM

## 2020-05-26 DIAGNOSIS — E1169 Type 2 diabetes mellitus with other specified complication: Secondary | ICD-10-CM

## 2020-07-05 ENCOUNTER — Other Ambulatory Visit: Payer: Self-pay | Admitting: Family Medicine

## 2020-07-05 DIAGNOSIS — E0800 Diabetes mellitus due to underlying condition with hyperosmolarity without nonketotic hyperglycemic-hyperosmolar coma (NKHHC): Secondary | ICD-10-CM

## 2020-07-05 DIAGNOSIS — Z794 Long term (current) use of insulin: Secondary | ICD-10-CM

## 2020-07-19 ENCOUNTER — Other Ambulatory Visit: Payer: Self-pay

## 2020-07-19 ENCOUNTER — Ambulatory Visit (INDEPENDENT_AMBULATORY_CARE_PROVIDER_SITE_OTHER): Payer: BC Managed Care – PPO | Admitting: Family Medicine

## 2020-07-19 ENCOUNTER — Encounter: Payer: Self-pay | Admitting: Family Medicine

## 2020-07-19 VITALS — BP 122/79 | HR 101 | Ht 68.0 in | Wt 244.4 lb

## 2020-07-19 DIAGNOSIS — M47814 Spondylosis without myelopathy or radiculopathy, thoracic region: Secondary | ICD-10-CM | POA: Diagnosis not present

## 2020-07-19 DIAGNOSIS — G35 Multiple sclerosis: Secondary | ICD-10-CM

## 2020-07-19 DIAGNOSIS — E1169 Type 2 diabetes mellitus with other specified complication: Secondary | ICD-10-CM | POA: Diagnosis not present

## 2020-07-19 DIAGNOSIS — Z794 Long term (current) use of insulin: Secondary | ICD-10-CM | POA: Diagnosis not present

## 2020-07-19 DIAGNOSIS — R0982 Postnasal drip: Secondary | ICD-10-CM

## 2020-07-19 DIAGNOSIS — G4733 Obstructive sleep apnea (adult) (pediatric): Secondary | ICD-10-CM

## 2020-07-19 DIAGNOSIS — M62830 Muscle spasm of back: Secondary | ICD-10-CM

## 2020-07-19 DIAGNOSIS — Z9989 Dependence on other enabling machines and devices: Secondary | ICD-10-CM

## 2020-07-19 LAB — POCT GLYCOSYLATED HEMOGLOBIN (HGB A1C): Hemoglobin A1C: 8.5 % — AB (ref 4.0–5.6)

## 2020-07-19 MED ORDER — TIZANIDINE HCL 4 MG PO TABS: ORAL_TABLET | ORAL | 3 refills | Status: DC

## 2020-07-19 MED ORDER — FLUTICASONE PROPIONATE 50 MCG/ACT NA SUSP: 2.0000 | Freq: Every day | NASAL | 3 refills | Status: DC

## 2020-07-19 MED ORDER — OZEMPIC (1 MG/DOSE) 4 MG/3ML ~~LOC~~ SOPN: 1.0000 mg | PEN_INJECTOR | SUBCUTANEOUS | 3 refills | Status: DC

## 2020-07-19 NOTE — Assessment & Plan Note (Signed)
Stable without recent exacerbation Followed by [facility] Continue current therapy as directed Recent steroid infusion Improved on Tizanidine

## 2020-07-19 NOTE — Assessment & Plan Note (Signed)
Well controlled, chronic OSA on CPAP - Good adherence to CPAP nightly - Continue current CPAP therapy, patient seems to be benefiting from therapy  

## 2020-07-19 NOTE — Assessment & Plan Note (Signed)
Stable at / Elevated A1c to 8.5 On treatment for MS. Rare steroid infusion for MS flare (hyperglycemia with steroids) No evidence of hypoglycemia on GLP and Insulin Failed Metformin 1000mg  BID (GI intolerance), XR was ineffective Complications - other including dyslipidemia with low HDL and history of hypertriglyceridemia, obesity, OSA - increases risk of future cardiovascular complications / poor glucose control due to OSA  Plan:  1. Continue current max dose Ozempic 1mg  weekly [place] - future will anticipate dose adjust to 2mg  in future once it is available on market - Adjust basaglar insulin from 18 to 20u can do split dosing 10 BID or adjust dosing - may adjust or titrate higher if need 2. Encourage improved lifestyle - low carb, low sugar diet, reduce portion size, continue improving regular exercise 3. Check CBG, bring log to next visit for review again 4. Continue Statin, ACE  Offer SGLT2 but we agree to hold for now, he is confident can lower A1c and we still prefer higher dose Ozempic when availble.

## 2020-07-19 NOTE — Patient Instructions (Addendum)
Thank you for coming to the office today.  Recent Labs    09/20/19 0833 03/15/20 0750 07/19/20 0827  HGBA1C 7.7* 8.5* 8.5*   We will try to increase Ozempic as soon as they release the new dosage. Hopefully Spring / Summer 2022.  Try to improve the diet if possible.  Refilled Ozempic and Tizanidine on file.  Start nasal steroid Flonase 2 sprays in each nostril daily for 4-6 weeks, may repeat course seasonally or as needed  Let me know if you need the spot treatment decongestant Atrovent.  Please schedule a Follow-up Appointment to: Return in about 4 months (around 11/18/2020) for 4 month follow-up DM A1c.  If you have any other questions or concerns, please feel free to call the office or send a message through [place]. You may also schedule an earlier appointment if necessary.  Additionally, you may be receiving a survey about your experience at our office within a few days to 1 week by e-mail or mail. We value your feedback.  Nobie Putnam, DO [place]

## 2020-07-19 NOTE — Progress Notes (Signed)
Subjective:    Patient ID: Miguel [name], male    DOB: 08-26-1970, 50 y.o.   MRN: [id number]  STAVROS CAIL II is a 50 y.o. male presenting on 07/19/2020 for Diabetes   HPI    FOLLOW-UPCHRONIC DM, Type 2: See prior notes for background information. He is doing well on Ozempic 1mg . He admits that steroid bag infusions often increases sugar - last infusion series was 2 days ago. He has had some diet changes due to limited on groceries pandemic. - Last A1c up to 8.5. now due today for A1c CBG log: avg 200+ Meds- - Ozempic1mg  [place] weekly- admits some constipation related -Basaglarat18u- taking nightly, often he increases dose with infusions. He may switch dosing. Currently on ACEi Lifestyle: Weight stable around 245 lbs - Diet (admits recently some poor diet lately and may be affecting his sugar) - Exercise (Limited exercisemore recently) - Last DM Eye 08/2019 Denies hypoglycemia  Chronic Neuropathic Pain /Back Pain / Multiple Sclerosis Followed by [facility]. See outside record for details. Continues on disease modifying therapy infusion every few months, rarely has flare up requiring bag steroid infusion. - Taking Gabapentin, Baclofen, Tramadol - temporary relief - Has been receiving spinal injections as well - Significant improvement still on Tizanidine 2-4mg  dosage PRN with great results to help pain. Takes it early to prevent. - in PT for past 7 weeks.  OSA on CPAP - Patient reports prior history of dx OSA and on CPAP for years, prior to treatment initial symptoms were snoring, daytime sleepiness and fatigue, has had several sleep studies in the past. - Today reports that sleep apnea is well controlled. He uses the CPAP machine every night. Tolerates the machine well, and thinks that sleeps better with it and feels good. No new concerns or symptoms.  History of COVID Omicron He has had some issues with this but has resolved well. Some post nasal drainage,  occasional dry cough.  Depression screen [facility] 2/9 03/22/2020 09/20/2019 03/21/2019  Decreased Interest 0 0 0  Down, Depressed, Hopeless 0 0 0  PHQ - 2 Score 0 0 0    Social History   Tobacco Use  . Smoking status: Never Smoker  . Smokeless tobacco: Never Used  Substance Use Topics  . Alcohol use: No  . Drug use: No    Review of Systems Per HPI unless specifically indicated above     Objective:    BP 122/79   Pulse (!) 101   Ht 5\' 8"  (1.727 m)   Wt 244 lb 6.4 oz (110.9 kg)   SpO2 99%   BMI 37.16 kg/m   Wt Readings from Last 3 Encounters:  07/19/20 244 lb 6.4 oz (110.9 kg)  03/22/20 247 lb (112 kg)  12/09/19 (!) 247 lb 12.8 oz (112.4 kg)    Physical Exam Vitals and nursing note reviewed.  Constitutional:      General: He is not in acute distress.    Appearance: He is well-developed and well-nourished. He is not diaphoretic.     Comments: Well-appearing, comfortable, cooperative  HENT:     Head: Normocephalic and atraumatic.     Mouth/Throat:     Mouth: Oropharynx is clear and moist.  Eyes:     General:        Right eye: No discharge.        Left eye: No discharge.     Conjunctiva/sclera: Conjunctivae normal.  Cardiovascular:     Rate and Rhythm: Normal rate.  Pulmonary:     Effort: Pulmonary effort is normal.  Musculoskeletal:        General: No edema.  Skin:    General: Skin is warm and dry.     Findings: No erythema or rash.  Neurological:     Mental Status: He is alert and oriented to person, place, and time.  Psychiatric:        Mood and Affect: Mood and affect normal.        Behavior: Behavior normal.     Comments: Well groomed, good eye contact, normal speech and thoughts    Results for orders placed or performed in visit on 07/19/20  POCT HgB A1C  Result Value Ref Range   Hemoglobin A1C 8.5 (A) 4.0 - 5.6 %   Recent Labs    09/20/19 0833 03/15/20 0750 07/19/20 0827  HGBA1C 7.7* 8.5* 8.5*       Assessment & Plan:   Problem List Items  Addressed This Visit    Type 2 diabetes mellitus with other specified complication ([place]) - Primary    Stable at / Elevated A1c to 8.5 On treatment for MS. Rare steroid infusion for MS flare (hyperglycemia with steroids) No evidence of hypoglycemia on GLP and Insulin Failed Metformin 1000mg  BID (GI intolerance), XR was ineffective Complications - other including dyslipidemia with low HDL and history of hypertriglyceridemia, obesity, OSA - increases risk of future cardiovascular complications / poor glucose control due to OSA  Plan:  1. Continue current max dose Ozempic 1mg  weekly [place] - future will anticipate dose adjust to 2mg  in future once it is available on market - Adjust basaglar insulin from 18 to 20u can do split dosing 10 BID or adjust dosing - may adjust or titrate higher if need 2. Encourage improved lifestyle - low carb, low sugar diet, reduce portion size, continue improving regular exercise 3. Check CBG, bring log to next visit for review again 4. Continue Statin, ACE  Offer SGLT2 but we agree to hold for now, he is confident can lower A1c and we still prefer higher dose Ozempic when availble.      Relevant Medications   OZEMPIC, 1 MG/DOSE, 4 MG/3ML SOPN   Other Relevant Orders   POCT HgB A1C (Completed)   OSA on CPAP    Well controlled, chronic OSA on CPAP - Good adherence to CPAP nightly - Continue current CPAP therapy, patient seems to be benefiting from therapy       Multiple sclerosis ([place])    Stable without recent exacerbation Followed by [facility] Continue current therapy as directed Recent steroid infusion Improved on Tizanidine       Other Visit Diagnoses    Spondylosis of thoracic region without myelopathy or radiculopathy       Relevant Medications   tiZANidine (ZANAFLEX) 4 MG tablet   Spasm of thoracic back muscle       Relevant Medications   tiZANidine (ZANAFLEX) 4 MG tablet   Post-nasal drip       Relevant Medications   fluticasone  (FLONASE) 50 MCG/ACT nasal spray     Post covid, post nasal drainage / allergies as well contributing Start nasal steroid Flonase 2 sprays in each nostril daily for 4-6 weeks, may repeat course seasonally or as needed   Call in Atrovent if needed PRN   Meds ordered this encounter  Medications  . tiZANidine (ZANAFLEX) 4 MG tablet    Sig: TAKE 1/2 TO 1 TABLET(2 TO 4 MG) BY MOUTH EVERY 8  HOURS AS NEEDED FOR MUSCLE SPASMS    Dispense:  60 tablet    Refill:  3    Keep refills on file.  Marland Kitchen OZEMPIC, 1 MG/DOSE, 4 MG/3ML SOPN    Sig: Inject 1 mg as directed once a week.    Dispense:  9 mL    Refill:  3    Keep refills on file.  . fluticasone (FLONASE) 50 MCG/ACT nasal spray    Sig: Place 2 sprays into both nostrils daily. Use for 4-6 weeks then stop and use seasonally or as needed.    Dispense:  16 g    Refill:  3      Follow up plan: Return in about 4 months (around 11/18/2020) for 4 month follow-up DM A1c.   Nobie Putnam, [facility] 07/19/2020, 8:27 AM

## 2020-07-27 DIAGNOSIS — J0110 Acute frontal sinusitis, unspecified: Secondary | ICD-10-CM

## 2020-07-27 MED ORDER — AMOXICILLIN-POT CLAVULANATE 875-125 MG PO TABS
1.0000 | ORAL_TABLET | Freq: Two times a day (BID) | ORAL | 0 refills | Status: DC
Start: 2020-07-27 — End: 2020-11-21

## 2020-07-27 MED ORDER — PREDNISONE 10 MG PO TABS: ORAL_TABLET | ORAL | 0 refills | Status: DC

## 2020-07-27 MED ORDER — IPRATROPIUM BROMIDE 0.06 % NA SOLN: 2.0000 | Freq: Four times a day (QID) | NASAL | 0 refills | Status: DC

## 2020-08-04 ENCOUNTER — Other Ambulatory Visit: Payer: Self-pay | Admitting: Family Medicine

## 2020-08-04 DIAGNOSIS — J0110 Acute frontal sinusitis, unspecified: Secondary | ICD-10-CM

## 2020-08-04 NOTE — Telephone Encounter (Signed)
Requested medication (s) are due for refill today: see notes to clinic  Requested medication (s) are on the active medication list: yes  Last refill:  07/27/20  Future visit scheduled: yes  Notes to clinic:  med not assigned to a protocol- per SIG: USE 2 SPRAYS IN EACH NOSTRIL FOUR TIMES DAILY FOR UP TO 5 TO 7 DAYS THEN STOP   Requested Prescriptions  Pending Prescriptions Disp Refills   ipratropium (ATROVENT) 0.06 % nasal spray [Pharmacy Med Name: IPRATROPIUM 0.06% NAS SP 15ML (165)] 15 mL 0    Sig: USE 2 SPRAYS IN EACH NOSTRIL FOUR TIMES DAILY FOR UP TO 5 TO 7 DAYS THEN STOP      Off-Protocol Failed - 08/04/2020  8:55 AM      Failed - Medication not assigned to a protocol, review manually.      Passed - Valid encounter within last 12 months    Recent Outpatient Visits           2 weeks ago Type 2 diabetes mellitus with other specified complication, with long-term current use of insulin [facility])   [place], DO   4 months ago Annual physical exam   [place], DO   7 months ago Skin infection   [facility], Lupita Raider, FNP   8 months ago Nephrolithiasis   [place], DO   10 months ago Type 2 diabetes mellitus with other specified complication, with long-term current use of insulin [facility])   [place], DO       Future Appointments             In 3 months [place], Devonne Doughty, DO [facility]            Off-Protocol Failed - 08/04/2020  8:55 AM      Failed - Medication not assigned to a protocol, review manually.      Passed - Valid encounter within last 12 months    Recent Outpatient Visits           2 weeks ago Type 2 diabetes mellitus with other specified complication, with long-term current use of insulin [facility])   [place], DO   4 months ago Annual physical exam   [place], DO   7 months ago Skin infection   [facility], Lupita Raider, FNP   8 months ago Nephrolithiasis   [place], DO   10 months ago Type 2 diabetes mellitus with other specified complication, with long-term current use of insulin ([place])   [facility] [place], Devonne Doughty, DO       Future Appointments             In 3 months Parks Ranger, Devonne Doughty, DO [facility]

## 2020-08-18 ENCOUNTER — Other Ambulatory Visit: Payer: Self-pay | Admitting: Family Medicine

## 2020-08-18 DIAGNOSIS — E1169 Type 2 diabetes mellitus with other specified complication: Secondary | ICD-10-CM

## 2020-08-18 DIAGNOSIS — Z794 Long term (current) use of insulin: Secondary | ICD-10-CM

## 2020-08-29 ENCOUNTER — Other Ambulatory Visit: Payer: Self-pay | Admitting: Family Medicine

## 2020-08-29 DIAGNOSIS — E1169 Type 2 diabetes mellitus with other specified complication: Secondary | ICD-10-CM

## 2020-09-14 LAB — HM DIABETES EYE EXAM

## 2020-10-08 ENCOUNTER — Other Ambulatory Visit: Payer: Self-pay | Admitting: Family Medicine

## 2020-10-08 DIAGNOSIS — Z794 Long term (current) use of insulin: Secondary | ICD-10-CM

## 2020-11-15 ENCOUNTER — Ambulatory Visit: Payer: BC Managed Care – PPO | Admitting: Family Medicine

## 2020-11-21 ENCOUNTER — Encounter: Payer: Self-pay | Admitting: Family Medicine

## 2020-11-21 ENCOUNTER — Ambulatory Visit: Payer: BC Managed Care – PPO | Admitting: Family Medicine

## 2020-11-21 ENCOUNTER — Other Ambulatory Visit: Payer: Self-pay | Admitting: Family Medicine

## 2020-11-21 ENCOUNTER — Other Ambulatory Visit: Payer: Self-pay

## 2020-11-21 VITALS — BP 120/71 | HR 99 | Ht 68.0 in | Wt 248.6 lb

## 2020-11-21 DIAGNOSIS — G4733 Obstructive sleep apnea (adult) (pediatric): Secondary | ICD-10-CM

## 2020-11-21 DIAGNOSIS — Z794 Long term (current) use of insulin: Secondary | ICD-10-CM

## 2020-11-21 DIAGNOSIS — Z9989 Dependence on other enabling machines and devices: Secondary | ICD-10-CM

## 2020-11-21 DIAGNOSIS — E1169 Type 2 diabetes mellitus with other specified complication: Secondary | ICD-10-CM

## 2020-11-21 DIAGNOSIS — M47814 Spondylosis without myelopathy or radiculopathy, thoracic region: Secondary | ICD-10-CM

## 2020-11-21 DIAGNOSIS — M62830 Muscle spasm of back: Secondary | ICD-10-CM | POA: Diagnosis not present

## 2020-11-21 DIAGNOSIS — Z0000 Encounter for general adult medical examination without abnormal findings: Secondary | ICD-10-CM

## 2020-11-21 DIAGNOSIS — G35 Multiple sclerosis: Secondary | ICD-10-CM

## 2020-11-21 DIAGNOSIS — Z125 Encounter for screening for malignant neoplasm of prostate: Secondary | ICD-10-CM

## 2020-11-21 DIAGNOSIS — E785 Hyperlipidemia, unspecified: Secondary | ICD-10-CM

## 2020-11-21 LAB — POCT GLYCOSYLATED HEMOGLOBIN (HGB A1C): Hemoglobin A1C: 9.2 % — AB (ref 4.0–5.6)

## 2020-11-21 MED ORDER — OZEMPIC (2 MG/DOSE) 8 MG/3ML ~~LOC~~ SOPN: 2.0000 mg | PEN_INJECTOR | SUBCUTANEOUS | 1 refills | Status: DC

## 2020-11-21 MED ORDER — BASAGLAR KWIKPEN 100 UNIT/ML ~~LOC~~ SOPN: 20.0000 [IU] | PEN_INJECTOR | Freq: Every day | SUBCUTANEOUS | 3 refills | Status: DC

## 2020-11-21 MED ORDER — TIZANIDINE HCL 4 MG PO TABS: ORAL_TABLET | ORAL | 3 refills | Status: DC

## 2020-11-21 NOTE — Assessment & Plan Note (Signed)
Stable without recent exacerbation Followed by [facility] Continue current therapy as directed Has had steroid course Improved on Tizanidine - refills 

## 2020-11-21 NOTE — Patient Instructions (Addendum)
Thank you for coming to the office today.  Increase dose Ozempic from 1mg  up to 2mg  new dose sent to pharmacy if not covered. We can do a free sample Ozempic 2mg  pen here in the pharmacy.  Refilled Tizanidine.  Recent Labs    03/15/20 0750 07/19/20 0827 11/21/20 0812  HGBA1C 8.5* 8.5* 9.2*    DUE for FASTING BLOOD WORK (no food or drink after midnight before the lab appointment, only water or coffee without cream/sugar on the morning of)  SCHEDULE "Lab Only" visit in the morning at the clinic for lab draw in 4 MONTHS   - Make sure Lab Only appointment is at about 1 week before your next appointment, so that results will be available  For Lab Results, once available within 2-3 days of blood draw, you can can log in to MyChart online to view your results and a brief explanation. Also, we can discuss results at next follow-up visit.    Please schedule a Follow-up Appointment to: Return in about 4 months (around 03/24/2021) for 4 month fasting lab only then 1 week later Annual Physical.  If you have any other questions or concerns, please feel free to call the office or send a message through [place]. You may also schedule an earlier appointment if necessary.  Additionally, you may be receiving a survey about your experience at our office within a few days to 1 week by e-mail or mail. We value your feedback.  Nobie Putnam, DO [place]

## 2020-11-21 NOTE — Assessment & Plan Note (Signed)
Elevated A1c 9.2 from prior 8.5 Still has had steroid courses, recently for illness, and MS flare, bag steroid infusions No evidence of hypoglycemia on GLP and Insulin Failed Metformin 1000mg  BID (GI intolerance), XR was ineffective Complications - other including dyslipidemia with low HDL and history of hypertriglyceridemia, obesity, OSA - increases risk of future cardiovascular complications / poor glucose control due to OSA  Plan:  1. Start INCREASED dose Ozempic from 1 to 2mg  weekly new rx sent, can do sample if need - Adjust basaglar insulin from 18 to 20u 2. Encourage improved lifestyle - low carb, low sugar diet, reduce portion size, continue improving regular exercise 3. Check CBG, bring log to next visit for review again 4. Continue Statin, ACE

## 2020-11-21 NOTE — Assessment & Plan Note (Signed)
Well controlled, chronic OSA on CPAP - Good adherence to CPAP nightly - Continue current CPAP therapy, patient seems to be benefiting from therapy  

## 2020-11-21 NOTE — Progress Notes (Signed)
Subjective:    Patient ID: Miguel [name], male    DOB: 19-Dec-1970, 50 y.o.   MRN: [id number]  Miguel [name] is a 50 y.o. male presenting on 11/21/2020 for Diabetes   HPI  FOLLOW-UP CHRONIC DM, Type 2: See prior notes for background information. He is doing well on Ozempic 1mg . He admits that steroid bag infusions often increases sugar, this can be every 6 month approx, also receiving steroid course periodically. - Last A1c up to 8.5. now due today for A1c CBG log: avg 200+ Meds- - Ozempic 1mg  [place] weekly - admits some constipation related - Basaglar at 20u needs new dose- taking nightly, often he increases dose with infusions. He may switch dosing. Currently on ACEi Lifestyle: Weight stable around 248 lbs. - Diet (admits recently some poor diet lately and may be affecting his sugar) - Exercise (Limited exercise more recently) - Last DM Eye 08/2019 Denies hypoglycemia   Chronic Neuropathic Pain / Back Pain / Multiple Sclerosis Followed by [facility]. See outside record for details. Continues on disease modifying therapy infusion every few months, rarely has flare up requiring bag steroid infusion. - Taking Gabapentin, Tizanidine, Tramadol - temporary relief - Has been receiving spinal injections as well - Significant improvement still on Tizanidine 2-4mg  dosage PRN with great results to help pain. Takes it early to prevent. Has rarely taken Baclofen PRN for spasms.  He does PT every 6 months, for 8 weeks approximately. He can feels like during off time he can be sore and stiff and not physically in shape.   OSA on CPAP  - Patient reports prior history of dx OSA and on CPAP for years, prior to treatment initial symptoms were snoring, daytime sleepiness and fatigue, has had several sleep studies in the past. - Today reports that sleep apnea is well controlled. He uses the CPAP machine every night. Tolerates the machine well, and thinks that sleeps better with it and  feels good. No new concerns or symptoms.    Depression screen [facility] 2/9 03/22/2020 09/20/2019 03/21/2019  Decreased Interest 0 0 0  Down, Depressed, Hopeless 0 0 0  PHQ - 2 Score 0 0 0    Social History   Tobacco Use   Smoking status: Never   Smokeless tobacco: Never  Substance Use Topics   Alcohol use: No   Drug use: No    Review of Systems Per HPI unless specifically indicated above     Objective:    BP 120/71   Pulse 99   Ht 5\' 8"  (1.727 m)   Wt 248 lb 9.6 oz (112.8 kg)   SpO2 95%   BMI 37.80 kg/m   Wt Readings from Last 3 Encounters:  11/21/20 248 lb 9.6 oz (112.8 kg)  07/19/20 244 lb 6.4 oz (110.9 kg)  03/22/20 247 lb (112 kg)    Physical Exam Vitals and nursing note reviewed.  Constitutional:      General: He is not in acute distress.    Appearance: Normal appearance. He is well-developed. He is not diaphoretic.     Comments: Well-appearing, comfortable, cooperative  HENT:     Head: Normocephalic and atraumatic.  Eyes:     General:        Right eye: No discharge.        Left eye: No discharge.     Conjunctiva/sclera: Conjunctivae normal.  Cardiovascular:     Rate and Rhythm: Normal rate.  Pulmonary:     Effort: Pulmonary  effort is normal.  Skin:    General: Skin is warm and dry.     Findings: No erythema or rash.  Neurological:     Mental Status: He is alert and oriented to person, place, and time.  Psychiatric:        Mood and Affect: Mood normal.        Behavior: Behavior normal.        Thought Content: Thought content normal.     Comments: Well groomed, good eye contact, normal speech and thoughts    Recent Labs    03/15/20 0750 07/19/20 0827 11/21/20 0812  HGBA1C 8.5* 8.5* 9.2*     Results for orders placed or performed in visit on 11/21/20  POCT HgB A1C  Result Value Ref Range   Hemoglobin A1C 9.2 (A) 4.0 - 5.6 %      Assessment & Plan:   Problem List Items Addressed This Visit     Type 2 diabetes mellitus with other specified  complication ([facility]) - Primary    Elevated A1c 9.2 from prior 8.5 Still has had steroid courses, recently for illness, and MS flare, bag steroid infusions No evidence of hypoglycemia on GLP and Insulin Failed Metformin 1000mg  BID (GI intolerance), XR was ineffective Complications - other including dyslipidemia with low HDL and history of hypertriglyceridemia, obesity, OSA - increases risk of future cardiovascular complications / poor glucose control due to OSA  Plan:  1. Start INCREASED dose Ozempic from 1 to 2mg  weekly new rx sent, can do sample if need - Adjust basaglar insulin from 18 to 20u 2. Encourage improved lifestyle - low carb, low sugar diet, reduce portion size, continue improving regular exercise 3. Check CBG, bring log to next visit for review again 4. Continue Statin, ACE       Relevant Medications   Insulin Glargine (BASAGLAR KWIKPEN) 100 UNIT/ML   OZEMPIC, 2 MG/DOSE, 8 MG/3ML SOPN   Other Relevant Orders   POCT HgB A1C (Completed)   OSA on CPAP    Well controlled, chronic OSA on CPAP - Good adherence to CPAP nightly - Continue current CPAP therapy, patient seems to be benefiting from therapy        Multiple sclerosis ([place])    Stable without recent exacerbation Followed by [facility] Continue current therapy as directed Has had steroid course Improved on Tizanidine - refills       Other Visit Diagnoses     Spondylosis of thoracic region without myelopathy or radiculopathy       Relevant Medications   tiZANidine (ZANAFLEX) 4 MG tablet   Spasm of thoracic back muscle       Relevant Medications   tiZANidine (ZANAFLEX) 4 MG tablet       Meds ordered this encounter  Medications   Insulin Glargine (BASAGLAR KWIKPEN) 100 UNIT/ML    Sig: Inject 20 Units into the skin daily.    Dispense:  15 mL    Refill:  3   OZEMPIC, 2 MG/DOSE, 8 MG/3ML SOPN    Sig: Inject 2 mg into the skin once a week.    Dispense:  9 mL    Refill:  1    Dose increase from  1mg  up to 2mg , 3 month / 84 day   tiZANidine (ZANAFLEX) 4 MG tablet    Sig: TAKE 1/2 TO 1 TABLET(2 TO 4 MG) BY MOUTH EVERY 8 HOURS AS NEEDED FOR MUSCLE SPASMS    Dispense:  60 tablet    Refill:  3  Keep refills on file.     Follow up plan: Return in about 4 months (around 03/24/2021) for 4 month fasting lab only then 1 week later Annual Physical.  Future labs ordered for 03/19/21   Nobie Putnam, [facility] 11/21/2020, 8:11 AM

## 2020-12-28 ENCOUNTER — Other Ambulatory Visit: Payer: Self-pay | Admitting: Family Medicine

## 2020-12-28 DIAGNOSIS — E118 Type 2 diabetes mellitus with unspecified complications: Secondary | ICD-10-CM

## 2020-12-28 DIAGNOSIS — Z794 Long term (current) use of insulin: Secondary | ICD-10-CM

## 2021-01-01 ENCOUNTER — Encounter: Payer: Self-pay | Admitting: Family Medicine

## 2021-01-12 ENCOUNTER — Other Ambulatory Visit: Payer: Self-pay | Admitting: Family Medicine

## 2021-01-12 DIAGNOSIS — E0800 Diabetes mellitus due to underlying condition with hyperosmolarity without nonketotic hyperglycemic-hyperosmolar coma (NKHHC): Secondary | ICD-10-CM

## 2021-02-21 ENCOUNTER — Other Ambulatory Visit: Payer: Self-pay | Admitting: Family Medicine

## 2021-02-21 DIAGNOSIS — M47814 Spondylosis without myelopathy or radiculopathy, thoracic region: Secondary | ICD-10-CM

## 2021-02-21 DIAGNOSIS — M62830 Muscle spasm of back: Secondary | ICD-10-CM

## 2021-02-21 NOTE — Telephone Encounter (Signed)
Requested medications are due for refill today.  yes  Requested medications are on the active medications list.  yes  Last refill. 11/21/2020  Future visit scheduled.   yes  Notes to clinic.  Medication not delegated. 

## 2021-03-04 ENCOUNTER — Ambulatory Visit: Payer: BC Managed Care – PPO | Admitting: Family Medicine

## 2021-03-07 ENCOUNTER — Other Ambulatory Visit: Payer: Self-pay

## 2021-03-07 ENCOUNTER — Ambulatory Visit: Payer: BC Managed Care – PPO | Admitting: Family Medicine

## 2021-03-07 ENCOUNTER — Encounter: Payer: Self-pay | Admitting: Family Medicine

## 2021-03-07 VITALS — BP 131/81 | HR 77 | Ht 68.0 in | Wt 246.0 lb

## 2021-03-07 DIAGNOSIS — Z87442 Personal history of urinary calculi: Secondary | ICD-10-CM

## 2021-03-07 DIAGNOSIS — R6882 Decreased libido: Secondary | ICD-10-CM | POA: Diagnosis not present

## 2021-03-07 DIAGNOSIS — Z23 Encounter for immunization: Secondary | ICD-10-CM

## 2021-03-07 DIAGNOSIS — R7989 Other specified abnormal findings of blood chemistry: Secondary | ICD-10-CM | POA: Diagnosis not present

## 2021-03-07 DIAGNOSIS — R5383 Other fatigue: Secondary | ICD-10-CM

## 2021-03-07 NOTE — Progress Notes (Signed)
Subjective:    Patient ID: Miguel [name], male    DOB: [birth year], 50 y.o.   MRN: [id number]  Miguel [name] is a 50 y.o. male presenting on 03/07/2021 for Fatigue   HPI  History Nephrolithiasis, recurrent No active stone today Previously [facility] Following with Urology yearly for surveillance of kidney stones, has had multiple removed in past with cystoscopy and lithotripsy, previously w/ Dr Jacqlyn Larsen has left practice and he has seen new provider. 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 Asking for repeat lab testosterone today  Diabetes Cannot get Ozempic 2mg  dose from pharmacy.  Health Maintenance: Due for Flu Shot, will receive today    Depression screen [facility] 2/9 03/07/2021 03/22/2020 09/20/2019  Decreased Interest 0 0 0  Down, Depressed, Hopeless 0 0 0  PHQ - 2 Score 0 0 0  Altered sleeping 0 - -  Tired, decreased energy 0 - -  Change in appetite 0 - -  Feeling bad or failure about yourself  0 - -  Trouble concentrating 0 - -  Moving slowly or fidgety/restless 0 - -  Suicidal thoughts 0 - -  PHQ-9 Score 0 - -  Difficult doing work/chores Not difficult at all - -    Social History   Tobacco Use   Smoking status: Never   Smokeless tobacco: Never  Substance Use Topics   Alcohol use: No   Drug use: No    Review of Systems Per HPI unless specifically indicated above     Objective:    BP 131/81   Pulse 77   Ht 5\' 8"  (1.727 m)   Wt 246 lb (111.6 kg)   SpO2 99%   BMI 37.40 kg/m   Wt Readings from Last 3 Encounters:  03/07/21 246 lb (111.6 kg)  11/21/20 248 lb 9.6 oz (112.8 kg)  07/19/20 244 lb 6.4 oz (110.9 kg)    Physical Exam Vitals and nursing note reviewed.  Constitutional:      General: He is not in  acute distress.    Appearance: Normal appearance. He is well-developed. He is not diaphoretic.     Comments: Well-appearing, comfortable, cooperative  HENT:     Head: Normocephalic and atraumatic.  Eyes:     General:        Right eye: No discharge.        Left eye: No discharge.     Conjunctiva/sclera: Conjunctivae normal.  Cardiovascular:     Rate and Rhythm: Normal rate.  Pulmonary:     Effort: Pulmonary effort is normal.  Skin:    General: Skin is warm and dry.     Findings: No erythema or rash.  Neurological:     Mental Status: He is alert and oriented to person, place, and time.  Psychiatric:        Mood and Affect: Mood normal.        Behavior: Behavior normal.        Thought Content: Thought content normal.     Comments: Well groomed, good eye contact, normal speech and thoughts   Results for orders placed or performed in visit on 11/21/20  POCT HgB A1C  Result Value Ref Range   Hemoglobin A1C 9.2 (A) 4.0 -  5.6 %      Assessment & Plan:   Problem List Items Addressed This Visit   None Visit Diagnoses     History of nephrolithiasis    -  Primary   Relevant Orders   Ambulatory referral to Urology   Fatigue, unspecified type       Relevant Orders   Testosterone   Decreased libido       Relevant Orders   Testosterone   Low testosterone in male       Relevant Orders   Testosterone   Ambulatory referral to Urology   Needs flu shot       Relevant Orders   Flu Vaccine QUAD 63mo+IM (Fluarix, Fluzone & Alfiuria Quad PF)       Hx Nephrolithiasis, recurrent No acute kidney stone symptoms or flare - currently Previously managed by Dr Jacqlyn [facility] at [facility], has changed urology there since he has left, and now will return to local urology for kidney stone surveillance. Usually will go once per year for monitoring. And sooner for acute if develop flare up with kidney stone.  He has only had success with ureterscopy/cystoscopy removal in past, has failed  lithotripsy.  Referral sent to [facility]  Additionally with fatigue/low libido history of Low T No recent lab T results on file Will re-check Testosterone AM lab today Refer to Uro can repeat or discuss treatment options. He was offered by Urology years ago but declined at that time.  Try to get Ozemipc 2mg  sample, otherwise he may have to go to 1mg  temporarily  Orders Placed This Encounter  Procedures   Flu Vaccine QUAD 50mo+IM (Fluarix, Fluzone & Alfiuria Quad PF)   Testosterone   Ambulatory referral to Urology    Referral Priority:   Routine    Referral Type:   Consultation    Referral Reason:   Specialty Services Required    Requested Specialty:   Urology    Number of Visits Requested:   1     No orders of the defined types were placed in this encounter.    Follow up plan: Return if symptoms worsen or fail to improve, for keep scheduled apt.   Nobie Putnam, [facility] 03/07/2021, 8:08 AM

## 2021-03-07 NOTE — Patient Instructions (Addendum)
Thank you for coming to the office today.  Referral to Urology - Dr Hollice Espy  [facility] -[address]st floor [place],  [place]  [address] Phone: [phone]   Testosterone lab result. Stay tuned.   Please schedule a Follow-up Appointment to: Return if symptoms worsen or fail to improve, for keep scheduled apt.  If you have any other questions or concerns, please feel free to call the office or send a message through [place]. You may also schedule an earlier appointment if necessary.  Additionally, you may be receiving a survey about your experience at our office within a few days to 1 week by e-mail or mail. We value your feedback.  Nobie Putnam, DO [place]

## 2021-03-08 LAB — TESTOSTERONE: Testosterone: 364 ng/dL (ref 250–827)

## 2021-03-18 ENCOUNTER — Other Ambulatory Visit: Payer: Self-pay

## 2021-03-18 ENCOUNTER — Telehealth: Payer: Self-pay | Admitting: Family Medicine

## 2021-03-18 DIAGNOSIS — Z0000 Encounter for general adult medical examination without abnormal findings: Secondary | ICD-10-CM

## 2021-03-18 DIAGNOSIS — E1169 Type 2 diabetes mellitus with other specified complication: Secondary | ICD-10-CM

## 2021-03-18 DIAGNOSIS — Z794 Long term (current) use of insulin: Secondary | ICD-10-CM

## 2021-03-18 DIAGNOSIS — G35 Multiple sclerosis: Secondary | ICD-10-CM

## 2021-03-18 DIAGNOSIS — E785 Hyperlipidemia, unspecified: Secondary | ICD-10-CM

## 2021-03-18 DIAGNOSIS — Z125 Encounter for screening for malignant neoplasm of prostate: Secondary | ICD-10-CM

## 2021-03-18 MED ORDER — OZEMPIC (2 MG/DOSE) 8 MG/3ML ~~LOC~~ SOPN
2.0000 mg | PEN_INJECTOR | SUBCUTANEOUS | 1 refills | Status: DC
  Filled 2021-04-03: qty 3, 28d supply, fill #0

## 2021-03-18 NOTE — Telephone Encounter (Signed)
Pt's spouse called in for assistance. She says that she was told by pt's current pharmacy that they are currently out of medication OZEMPIC, 2 MG/DOSE, 8 MG/3ML SOPN  pt would like to know if provider has samples in the office?    Also, pt was told [facility] medication [facility], spouse would like to know if provider could sent Rx there instead?   [phone]- please advise.

## 2021-03-18 NOTE — Telephone Encounter (Signed)
No samples available today. Sent rx ozempic 2mg  to [facility] 03/18/2021, 4:10 PM

## 2021-03-19 ENCOUNTER — Other Ambulatory Visit: Payer: BC Managed Care – PPO

## 2021-03-19 ENCOUNTER — Other Ambulatory Visit: Payer: Self-pay

## 2021-03-20 LAB — CBC WITH DIFFERENTIAL/PLATELET
Absolute Monocytes: 500 cells/uL (ref 200–950)
Basophils Absolute: 31 cells/uL (ref 0–200)
Basophils Relative: 0.5 %
Eosinophils Absolute: 122 cells/uL (ref 15–500)
Eosinophils Relative: 2 %
HCT: 48.2 % (ref 38.5–50.0)
Hemoglobin: 16.7 g/dL (ref 13.2–17.1)
Lymphs Abs: 1336 cells/uL (ref 850–3900)
MCH: 32.2 pg (ref 27.0–33.0)
MCHC: 34.6 g/dL (ref 32.0–36.0)
MCV: 92.9 fL (ref 80.0–100.0)
MPV: 10.7 fL (ref 7.5–12.5)
Monocytes Relative: 8.2 %
Neutro Abs: 4111 cells/uL (ref 1500–7800)
Neutrophils Relative %: 67.4 %
Platelets: 194 10*3/uL (ref 140–400)
RBC: 5.19 10*6/uL (ref 4.20–5.80)
RDW: 11.9 % (ref 11.0–15.0)
Total Lymphocyte: 21.9 %
WBC: 6.1 10*3/uL (ref 3.8–10.8)

## 2021-03-20 LAB — COMPLETE METABOLIC PANEL WITH GFR
AG Ratio: 2.1 (calc) (ref 1.0–2.5)
ALT: 37 U/L (ref 9–46)
AST: 21 U/L (ref 10–40)
Albumin: 4.5 g/dL (ref 3.6–5.1)
Alkaline phosphatase (APISO): 85 U/L (ref 36–130)
BUN: 16 mg/dL (ref 7–25)
CO2: 24 mmol/L (ref 20–32)
Calcium: 9.6 mg/dL (ref 8.6–10.3)
Chloride: 101 mmol/L (ref 98–110)
Creat: 0.72 mg/dL (ref 0.60–1.29)
Globulin: 2.1 g/dL (calc) (ref 1.9–3.7)
Glucose, Bld: 221 mg/dL — ABNORMAL HIGH (ref 65–99)
Potassium: 4.2 mmol/L (ref 3.5–5.3)
Sodium: 137 mmol/L (ref 135–146)
Total Bilirubin: 1 mg/dL (ref 0.2–1.2)
Total Protein: 6.6 g/dL (ref 6.1–8.1)
eGFR: 112 mL/min/{1.73_m2} (ref >=60)

## 2021-03-20 LAB — LIPID PANEL
Cholesterol: 158 mg/dL (ref <200)
HDL: 34 mg/dL — ABNORMAL LOW (ref >=40)
LDL Cholesterol (Calc): 91 mg/dL (calc)
Non-HDL Cholesterol (Calc): 124 mg/dL (calc) (ref <130)
Total CHOL/HDL Ratio: 4.6 (calc) (ref <5.0)
Triglycerides: 237 mg/dL — ABNORMAL HIGH (ref <150)

## 2021-03-20 LAB — HEMOGLOBIN A1C
Hgb A1c MFr Bld: 9.3 % of total Hgb — ABNORMAL HIGH (ref <5.7)
Mean Plasma Glucose: 220 mg/dL
eAG (mmol/L): 12.2 mmol/L

## 2021-03-20 LAB — TSH: TSH: 1.13 mIU/L (ref 0.40–4.50)

## 2021-03-20 LAB — PSA: PSA: 0.43 ng/mL (ref <=4.00)

## 2021-03-25 ENCOUNTER — Other Ambulatory Visit: Payer: Self-pay

## 2021-03-25 ENCOUNTER — Encounter: Payer: Self-pay | Admitting: Family Medicine

## 2021-03-25 ENCOUNTER — Ambulatory Visit (INDEPENDENT_AMBULATORY_CARE_PROVIDER_SITE_OTHER): Payer: BC Managed Care – PPO | Admitting: Family Medicine

## 2021-03-25 VITALS — BP 122/71 | HR 83 | Ht 68.0 in | Wt 248.8 lb

## 2021-03-25 DIAGNOSIS — Z0000 Encounter for general adult medical examination without abnormal findings: Secondary | ICD-10-CM

## 2021-03-25 DIAGNOSIS — G4733 Obstructive sleep apnea (adult) (pediatric): Secondary | ICD-10-CM | POA: Diagnosis not present

## 2021-03-25 DIAGNOSIS — Z9989 Dependence on other enabling machines and devices: Secondary | ICD-10-CM

## 2021-03-25 DIAGNOSIS — E1169 Type 2 diabetes mellitus with other specified complication: Secondary | ICD-10-CM | POA: Diagnosis not present

## 2021-03-25 DIAGNOSIS — E785 Hyperlipidemia, unspecified: Secondary | ICD-10-CM

## 2021-03-25 DIAGNOSIS — Z794 Long term (current) use of insulin: Secondary | ICD-10-CM | POA: Diagnosis not present

## 2021-03-25 NOTE — Progress Notes (Signed)
Subjective:    Patient ID: Miguel [name], male    DOB: [birth year], 50 y.o.   MRN: [id number]  Miguel [name] is a 50 y.o. male presenting on 03/25/2021 for Annual Exam   HPI  Here for Annual Physical and Lab Review.  FOLLOW-UP CHRONIC DM, Type 2: See prior notes for background information.  He is doing well on Ozempic 2m. He admits that steroid bag infusions often increases sugar,  Last visit has had infusion or injection A1c up to 9.3 CBG log: avg 200+ Meds- - Ozempic 257msc weekly - admits some constipation related - Basaglar at 20u needs new dose- taking nightly, often he increases dose with infusions. He may switch dosing. Currently on ACEi Lifestyle: Weight down 5 lbs, he is wearing boots today - Diet (admits recently some poor diet lately and may be affecting his sugar) - Exercise (Limited exercise more recently) - Last DM Eye 08/2019 Denies hypoglycemia  Chronic Neuropathic Pain / Back Pain / Multiple Sclerosis [facility] See outside record for details. Continues on disease modifying therapy infusion every few months, rarely has flare up requiring bag steroid infusion. - Taking Gabapentin, Tizanidine, Tramadol - temporary relief - Has been receiving spinal injections as well - Significant improvement still on Tizanidine [id number] PRN with great results to help pain. Takes it early to prevent. Has rarely taken Baclofen PRN for spasms.   He does PT every 6 months, for 8 weeks approximately. He can feels like during off time he can be sore and stiff and not physically in shape.   OSA on CPAP  - Patient reports prior history of dx OSA and on CPAP for years, prior to treatment initial symptoms were snoring, daytime sleepiness and fatigue, has had several sleep studies in the past. - Today reports that sleep apnea is well controlled. He uses the CPAP machine every night. Tolerates the machine well, and thinks that sleeps better with it and  feels good. No new concerns or symptoms.   Health Maintenance:  Colon Screening in 2023.   [facility]/07[facility]21 03/22/2020 09/20/2019  Decreased Interest 0 0 0  Down, Depressed, Hopeless 0 0 0  PHQ - 2 Score 0 0 0  Altered sleeping 0 - -  Tired, decreased energy 0 - -  Change in appetite 0 - -  Feeling bad or failure about yourself  0 - -  Trouble concentrating 0 - -  Moving slowly or fidgety/restless 0 - -  Suicidal thoughts 0 - -  PHQ-9 Score 0 - -  Difficult doing work/chores Not difficult at all - -    Past Medical History:  Diagnosis Date   Allergy    Sleep apnea    Past Surgical History:  Procedure Laterality Date   HERNIA REPAIR     Social History   Socioeconomic History   Marital status: Married    Spouse name: Not on file   Number of children: Not on file   Years of education: Not on file   Highest education level: Not on file  Occupational History   Not on file  Tobacco Use   Smoking status: Never   Smokeless tobacco: Never  Substance and Sexual Activity   Alcohol use: No   Drug use: No   Sexual activity: Not on file  Other Topics Concern   Not on file  Social History Narrative   Not on file   Social Determinants of Health  Financial Resource Strain: Not on file  Food Insecurity: Not on file  Transportation Needs: Not on file  Physical Activity: Not on file  Stress: Not on file  Social Connections: Not on file  Intimate Partner Violence: Not on file   Family History  Problem Relation Age of Onset   Colon cancer Neg Hx    Prostate cancer Neg Hx    Current Outpatient Medications on File Prior to Visit  Medication Sig   atorvastatin (LIPITOR) 20 MG tablet Take 1 tablet (20 mg total) by mouth at bedtime.   B-D UF III MINI PEN NEEDLES 31G X 5 MM MISC USE ONCE DAILY AS DIRECTED   Cholecalciferol (D 1000) 1000 units capsule Take by mouth.    Cyanocobalamin (VITAMIN B-12) 5000 MCG SUBL Place under the tongue.   desonide  (DESOWEN) 0.05 % cream Apply topically daily as needed.   fluticasone (FLONASE) 50 MCG/ACT nasal spray Place 2 sprays into both nostrils daily. Use for 4-6 weeks then stop and use seasonally or as needed.   gabapentin (NEURONTIN) 300 MG capsule Take 300 mg by mouth continuous as needed.    ibuprofen (ADVIL,MOTRIN) 800 MG tablet Take 800 mg by mouth every 6 (six) hours as needed.    Insulin Glargine (BASAGLAR KWIKPEN) 100 UNIT/ML Inject 20 Units into the skin daily.   ipratropium (ATROVENT) 0.06 % nasal spray Place 2 sprays into both nostrils 4 (four) times daily.   ketoconazole (NIZORAL) 2 % shampoo Apply 1 application topically daily as needed.   Lancets (ONETOUCH ULTRASOFT) lancets Check blood sugar three times daily.   lisinopril (ZESTRIL) 2.5 MG tablet TAKE 1 TABLET(2.5 MG) BY MOUTH DAILY   Ocrelizumab (OCREVUS IV) Inject into the vein.   ONETOUCH ULTRA test strip USE TO CHECK BLOOD SUGAR THREE TIMES DAILY AS DIRECTED   oxybutynin (DITROPAN) 5 MG tablet TAKE 1 TABLET(5 MG) BY MOUTH EVERY 8 HOURS AS NEEDED FOR BLADDER SPASMS   OZEMPIC, 2 MG/DOSE, 8 MG/3ML SOPN Inject 2 mg into the skin once a week.   tamsulosin (FLOMAX) 0.4 MG CAPS capsule TAKE ONE CAPSULE BY MOUTH DAILY AS NEEDED FOR KIDNEY STONE FOR 7 TO 14 DAYS   tiZANidine (ZANAFLEX) 4 MG tablet TAKE 1/2 TO 1 TABLET(2 TO 4 MG) BY MOUTH EVERY 8 HOURS AS NEEDED FOR MUSCLE SPASMS   No current facility-administered medications on file prior to visit.    Review of Systems  Constitutional:  Negative for activity change, appetite change, chills, diaphoresis, fatigue and fever.  HENT:  Negative for congestion and hearing loss.   Eyes:  Negative for visual disturbance.  Respiratory:  Negative for cough, chest tightness, shortness of breath and wheezing.   Cardiovascular:  Negative for chest pain, palpitations and leg swelling.  Gastrointestinal:  Negative for abdominal pain, constipation, diarrhea, nausea and vomiting.  Genitourinary:   Negative for dysuria, frequency and hematuria.  Musculoskeletal:  Negative for arthralgias and neck pain.  Skin:  Negative for rash.  Neurological:  Negative for dizziness, weakness, light-headedness, numbness and headaches.  Hematological:  Negative for adenopathy.  Psychiatric/Behavioral:  Negative for behavioral problems, dysphoric mood and sleep disturbance.   Per HPI unless specifically indicated above      Objective:    BP 122/71   Pulse 83   Ht 5' 8"  (1.727 m)   Wt 248 lb 12.8 oz (112.9 kg)   SpO2 98%   BMI 37.83 kg/m   Wt Readings from Last 3 Encounters:  03/25/21 248 lb 12.8 oz (112.9  kg)  03/07/21 246 lb (111.6 kg)  11/21/20 248 lb 9.6 oz (112.8 kg)    Physical Exam Vitals and nursing note reviewed.  Constitutional:      General: He is not in acute distress.    Appearance: He is well-developed. He is not diaphoretic.     Comments: Well-appearing, comfortable, cooperative  HENT:     Head: Normocephalic and atraumatic.  Eyes:     General:        Right eye: No discharge.        Left eye: No discharge.     Conjunctiva/sclera: Conjunctivae normal.     Pupils: Pupils are equal, round, and reactive to light.  Neck:     Thyroid: No thyromegaly.  Cardiovascular:     Rate and Rhythm: Normal rate and regular rhythm.     Pulses: Normal pulses.     Heart sounds: Normal heart sounds. No murmur heard. Pulmonary:     Effort: Pulmonary effort is normal. No respiratory distress.     Breath sounds: Normal breath sounds. No wheezing or rales.  Abdominal:     General: Bowel sounds are normal. There is no distension.     Palpations: Abdomen is soft. There is no mass.     Tenderness: There is no abdominal tenderness.  Musculoskeletal:        General: No tenderness. Normal range of motion.     Cervical back: Normal range of motion and neck supple.     Right lower leg: No edema.     Left lower leg: No edema.     Comments: Upper / Lower Extremities: - Normal muscle tone,  strength bilateral upper extremities 5/5, lower extremities 5/5  Lymphadenopathy:     Cervical: No cervical adenopathy.  Skin:    General: Skin is warm and dry.     Findings: No erythema or rash.  Neurological:     Mental Status: He is alert and oriented to person, place, and time.     Comments: Distal sensation intact to light touch all extremities  Psychiatric:        Mood and Affect: Mood normal.        Behavior: Behavior normal.        Thought Content: Thought content normal.     Comments: Well groomed, good eye contact, normal speech and thoughts    Diabetic Foot Exam - Simple   Simple Foot Form Diabetic Foot exam was performed with the following findings: Yes 03/25/2021  9:20 AM  Visual Inspection See comments: Yes Sensation Testing Intact to touch and monofilament testing bilaterally: Yes Pulse Check Posterior Tibialis and Dorsalis pulse intact bilaterally: Yes Comments Dry skin, mild callus formation forefoot and heel. No ulceration.      Results for orders placed or performed in visit on 03/18/21  TSH  Result Value Ref Range   TSH 1.13 0.40 - 4.50 mIU/L  PSA  Result Value Ref Range   PSA 0.43 < OR = 4.00 ng/mL  Hemoglobin A1c  Result Value Ref Range   Hgb A1c MFr Bld 9.3 (H) <5.7 % of total Hgb   Mean Plasma Glucose 220 mg/dL   eAG (mmol/L) 12.2 mmol/L  CBC with Differential/Platelet  Result Value Ref Range   WBC 6.1 3.8 - 10.8 Thousand/uL   RBC 5.19 4.20 - 5.80 Million/uL   Hemoglobin 16.7 13.2 - 17.1 g/dL   HCT 48.2 38.5 - 50.0 %   MCV 92.9 80.0 - 100.0 fL   MCH 32.2 27.0 -  33.0 pg   MCHC 34.6 32.0 - 36.0 g/dL   RDW 11.9 11.0 - 15.0 %   Platelets 194 140 - 400 Thousand/uL   MPV 10.7 7.5 - 12.5 fL   Neutro Abs 4,111 1,500 - 7,800 cells/uL   Lymphs Abs 1,336 850 - 3,900 cells/uL   Absolute Monocytes 500 200 - 950 cells/uL   Eosinophils Absolute 122 15 - 500 cells/uL   Basophils Absolute 31 0 - 200 cells/uL   Neutrophils Relative % 67.4 %   Total  Lymphocyte 21.9 %   Monocytes Relative 8.2 %   Eosinophils Relative 2.0 %   Basophils Relative 0.5 %  Lipid panel  Result Value Ref Range   Cholesterol 158 <200 mg/dL   HDL 34 (L) > OR = 40 mg/dL   Triglycerides 237 (H) <150 mg/dL   LDL Cholesterol (Calc) 91 mg/dL (calc)   Total CHOL/HDL Ratio 4.6 <5.0 (calc)   Non-HDL Cholesterol (Calc) 124 <130 mg/dL (calc)  COMPLETE METABOLIC PANEL WITH GFR  Result Value Ref Range   Glucose, Bld 221 (H) 65 - 99 mg/dL   BUN 16 7 - 25 mg/dL   Creat 0.72 0.60 - 1.29 mg/dL   eGFR 112 > OR = 60 mL/min/1.107m   BUN/Creatinine Ratio NOT APPLICABLE 6 - 22 (calc)   Sodium 137 135 - 146 mmol/L   Potassium 4.2 3.5 - 5.3 mmol/L   Chloride 101 98 - 110 mmol/L   CO2 24 20 - 32 mmol/L   Calcium 9.6 8.6 - 10.3 mg/dL   Total Protein 6.6 6.1 - 8.1 g/dL   Albumin 4.5 3.6 - 5.1 g/dL   Globulin 2.1 1.9 - 3.7 g/dL (calc)   AG Ratio 2.1 1.0 - 2.5 (calc)   Total Bilirubin 1.0 0.2 - 1.2 mg/dL   Alkaline phosphatase (APISO) 85 36 - 130 U/L   AST 21 10 - 40 U/L   ALT 37 9 - 46 U/L      Assessment & Plan:   Problem List Items Addressed This Visit     Type 2 diabetes mellitus with other specified complication (HCC)    Elevated A1c 9.3 Prior 8-9 range Still has had steroid courses, recently for illness, and MS flare, bag steroid infusions No evidence of hypoglycemia on GLP and Insulin Failed Metformin 10086mBID (GI intolerance), XR was ineffective Complications - other including dyslipidemia with low HDL and history of hypertriglyceridemia, obesity, OSA - increases risk of future cardiovascular complications / poor glucose control due to OSA  Plan:  1. Keep Ozemipc [id number] - [place] - [place] if other brand works better 2. Encourage improved lifestyle - low carb, low sugar diet, reduce portion size, continue improving regular exercise 3. Check CBG, bring log to  next visit for review again 4. Continue Statin, ACE DM [facility] [place] 08/2020 request DM Foot  Referral to [facility] DM assistance with insulin / CGM and managing A1c      Relevant Orders   AMB Referral to [facility] on CPAP    Well controlled, chronic OSA on CPAP - Good adherence to CPAP nightly - Continue current CPAP therapy, patient seems to be benefiting from therapy       Hyperlipidemia associated with type 2 diabetes mellitus (HCC)    Elevated LDL Last lipid panel 02/2021 The 10-year ASCVD risk score (Arnett DK, et al., 2019)  is: 5.9%   Plan: 1. Continue current meds - Atorvastatin 60m daily 2. Continue ASA 814mfor primary ASCVD risk reduction 3. Encourage improved lifestyle - low carb/cholesterol, reduce portion size, continue improving regular exercise 4. Follow-up lipids yearly      Other Visit Diagnoses     Annual physical exam    -  Primary       Updated Health Maintenance information Reviewed recent lab results with patient Encouraged improvement to lifestyle with diet and exercise Goal of weight loss  Forward chart to [place]  Procedures   AMB Referral to [place]  Referral Priority:   Routine    Referral Type:   Consultation    Referral Reason:   Care Coordination    Number of Visits Requested:   1     No orders of the defined types were placed in this encounter.     Follow up plan: Return in about 3 months (around 06/25/2021) for 3 month DM A1c (Colon Screening as well).  [facility]nam[facility] 03/25/2021, 9:04 AM

## 2021-03-25 NOTE — Assessment & Plan Note (Signed)
Elevated A1c 9.3 Prior 8-9 range Still has had steroid courses, recently for illness, and MS flare, bag steroid infusions No evidence of hypoglycemia on GLP and Insulin Failed Metformin 1000mg  BID (GI intolerance), XR was ineffective Complications - other including dyslipidemia with low HDL and history of hypertriglyceridemia, obesity, OSA - increases risk of future cardiovascular complications / poor glucose control due to OSA  Plan:  1. Keep Ozemipc 2mg  weekly - has sample pharmacy out of [place] on insulin 20u - will switch to [place] sample today from [place], see if other brand works better 2. Encourage improved lifestyle - low carb, low sugar diet, reduce portion size, continue improving regular exercise 3. Check CBG, bring log to next visit for review again 4. Continue Statin, ACE DM [facility] [place] 08/2020 request DM Foot  Referral to [place] for DM assistance with insulin / CGM and managing A1c

## 2021-03-25 NOTE — Assessment & Plan Note (Signed)
Elevated LDL Last lipid panel 02/2021 The 10-year ASCVD risk score (Arnett DK, et al., 2019) is: 5.9%   Plan: 1. Continue current meds - Atorvastatin 20mg  daily 2. Continue ASA 81mg  for primary ASCVD risk reduction 3. Encourage improved lifestyle - low carb/cholesterol, reduce portion size, continue improving regular exercise 4. Follow-up lipids yearly

## 2021-03-25 NOTE — Patient Instructions (Addendum)
Thank you for coming to the office today.  Recent Labs    07/19/20 0827 11/21/20 0812 03/19/21 0913  HGBA1C 8.5* 9.2* 9.3*    Keep improving on Ozempic  Stop Basaglar and start Tresiba same dose  Stay tuned for Grayland Ormond (pharmacist) to call you to discuss continuous monitor and insulins and A1c.   Please schedule a Follow-up Appointment to: Return in about 3 months (around 06/25/2021) for 3 month DM A1c (Colon Screening as well).  If you have any other questions or concerns, please feel free to call the office or send a message through [place]. You may also schedule an earlier appointment if necessary.  Additionally, you may be receiving a survey about your experience at our office within a few days to 1 week by e-mail or mail. We value your feedback.  Nobie Putnam, DO [place]

## 2021-03-25 NOTE — Progress Notes (Signed)
03/26/21 9:17 AM   Miguel [name] [birth year] [id number]  Referring provider:  Olin Hauser, DO [address] [place],  [place] [address] Chief Complaint  Patient presents with   Nephrolithiasis    New Patient     HPI: Miguel [name] is a 50 y.o.male who presents today for further evaluation of low testosterone and history of nephrolithiasis.   He was last seen at [facility]rology by, Dr.Johnson, on 08/31/2020 for calculus of kidney.  Prior to that, he is followed by Dr. Jacqlyn Larsen.  He is s/p 3 procedures for history of recurrent nephrolithiasis; ESWL and 2 URS . Most recent procedure was May 2018 where he had a right 7 mm renal pelvis stone and a smaller second stone in the same kidney. Had significant stent pain post-operatively and had to come back to the ED.  No recent imaging.    His stone composition is 90% CaOx mono, 10% CaOx di.   He has a history of right groin pain secondary to inguinal hernia repair which he is seeing pain specialist, doing physical therapy and occasionally taking tramadol and muscle relaxant.  His most recent testosterone levels as of 03/07/2021 was 364 ng/dL.   He repots that he has been experiencing low energy, which he believes is due to low testosterone.   He also has multiple comorbidities including obesity, MS, DMII, OSA on CPAP.Marland Kitchen  He is primarily concerned about his energy level.  He denies ED or libido issues.  He remembers being told that his testosterone levels were on the low side by Dr. Jacqlyn Larsen in remote past.  At the time, he considered using topical androgen therapy but elected not to pursue this.  He now knows about gentleman who are using injections and is interested in this.    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  Borderline but uses CPAP   Peripheral neuropathic pain 19/62/2297   Renal colic 98/92/1194   Shoulder pain 07/23/2015   Sleep apnea     Surgical History: Past Surgical History:  Procedure Laterality Date   HERNIA REPAIR     URETEROSCOPY WITH HOLMIUM LASER LITHOTRIPSY     x 2    Home Medications:  Allergies as of 03/26/2021   No Known Allergies      Medication List        Accurate as of March 26, 2021  9:17 AM. If you have any questions, ask your nurse or doctor.          STOP taking these medications    oxybutynin 5 MG tablet Commonly known as: DITROPAN Stopped by: Hollice Espy, MD       TAKE these medications    atorvastatin 20 MG tablet Commonly known as: LIPITOR Take 1 tablet (20 mg total) by mouth at bedtime.   B-D UF III MINI PEN NEEDLES 31G X 5 MM Misc Generic drug: Insulin Pen  Needle USE ONCE DAILY AS DIRECTED   Basaglar KwikPen 100 UNIT/ML Inject 20 Units into the skin daily.   D 1000 25 MCG (1000 UT) capsule Generic drug: Cholecalciferol Take by mouth.   desonide 0.05 % cream Commonly known as: DESOWEN Apply topically daily as needed.   fluticasone 50 MCG/ACT nasal spray Commonly known as: FLONASE Place 2 sprays into both nostrils daily. Use for 4-6 weeks then stop and use seasonally or as needed.   gabapentin 300 MG capsule Commonly known as: NEURONTIN Take 300 mg by mouth continuous as needed.   ibuprofen 800 MG tablet Commonly known as: ADVIL Take 800 mg by mouth every 6 (six) hours as needed.   ipratropium 0.06 % nasal spray Commonly known as: ATROVENT Place 2 sprays into both nostrils 4 (four)  times daily.   ketoconazole 2 % shampoo Commonly known as: NIZORAL Apply 1 application topically daily as needed.   lisinopril 2.5 MG tablet Commonly known as: ZESTRIL TAKE 1 TABLET(2.5 MG) BY MOUTH DAILY   OCREVUS IV Inject into the vein.   OneTouch Ultra test strip Generic drug: glucose blood USE TO CHECK BLOOD SUGAR THREE TIMES DAILY AS DIRECTED   onetouch ultrasoft lancets Check blood sugar three times daily.   Ozempic (2 MG/DOSE) 8 MG/3ML Sopn Generic drug: Semaglutide (2 MG/DOSE) Inject 2 mg into the skin once a week.   tamsulosin 0.4 MG Caps capsule Commonly known as: FLOMAX TAKE ONE CAPSULE BY MOUTH DAILY AS NEEDED FOR KIDNEY STONE FOR 7 TO 14 DAYS   tiZANidine 4 MG tablet Commonly known as: ZANAFLEX TAKE 1/2 TO 1 TABLET(2 TO 4 MG) BY MOUTH EVERY 8 HOURS AS NEEDED FOR MUSCLE SPASMS   Vitamin B-12 5000 MCG Subl Place under the tongue.        Allergies: No Known Allergies  Family History: Family History  Problem Relation Age of Onset   Colon cancer Neg Hx    Prostate cancer Neg Hx    Bladder Cancer Neg Hx    Kidney cancer Neg Hx     Social History:  reports that he has never smoked. He has never used smokeless tobacco. He reports that he does not drink alcohol and does not use drugs.   Physical Exam: BP 126/82   Pulse 94   Ht 5\' 8"  (1.727 m)   Wt 249 lb (112.9 kg)   BMI 37.86 kg/m   Constitutional:  Alert and oriented, No acute distress. HEENT: [place] AT, moist mucus membranes.  Trachea midline, no masses. Cardiovascular: No clubbing, cyanosis, or edema. Respiratory: Normal respiratory effort, no increased work of breathing Skin: No rashes, bruises or suspicious lesions. Neurologic: Grossly intact, no focal deficits, moving all 4 extremities. Psychiatric: Normal mood and affect.  Laboratory Data:  Lab Results  Component Value Date   CREATININE 0.72 03/19/2021    Lab Results  Component Value Date   PSA 0.43 03/19/2021   PSA 0.35  03/15/2020    Lab Results  Component Value Date   TESTOSTERONE 364 03/07/2021    Lab Results  Component Value Date   HGBA1C 9.3 (H) 03/19/2021    Assessment & Plan:    History of kidney stones  - S/p ESWL and 2 ureteroscopy  - He has not had any recent stone episodes - He has had no recent upper tract imaging. KUB  ordered today.   2.  Hypogonadism. low energy -Discussed today that symptoms are currently nonspecific and may be more related to his additional comorbidities rather  than his borderline low testosterone.  I explained that at this point in time, his testosterone falls within the normal range and given the lack of specificity of his symptoms, do not feel that treatment is indicated at this time. -Emphasized lifestyle modification to try to boost his own testosterone levels including weight loss, avoidance of narcotics, tighter glycemic control -Can recheck labs in the future to reassess   Follow-up in 1 year with KUB prior   I,Kailey Littlejohn,acting as a scribe for Hollice Espy, MD.,have documented all relevant documentation on the behalf of Hollice Espy, MD,as directed by  Hollice Espy, MD while in the presence of Hollice Espy, MD.  I have reviewed the above documentation for accuracy and completeness, and I agree with the above.   Hollice Espy, MD    [facility] [address], [name], [place] [address] [phone]

## 2021-03-25 NOTE — Assessment & Plan Note (Signed)
Well controlled, chronic OSA on CPAP - Good adherence to CPAP nightly - Continue current CPAP therapy, patient seems to be benefiting from therapy  

## 2021-03-26 ENCOUNTER — Telehealth: Payer: Self-pay

## 2021-03-26 ENCOUNTER — Ambulatory Visit: Payer: BC Managed Care – PPO | Admitting: Urology

## 2021-03-26 ENCOUNTER — Ambulatory Visit
Admission: RE | Admit: 2021-03-26 | Discharge: 2021-03-26 | Disposition: A | Discharge status: Home / Self Care | Payer: BC Managed Care – PPO | Source: Ambulatory Visit | Attending: Urology | Admitting: Urology

## 2021-03-26 ENCOUNTER — Encounter: Payer: Self-pay | Admitting: Urology

## 2021-03-26 VITALS — BP 126/82 | HR 94 | Ht 68.0 in | Wt 249.0 lb

## 2021-03-26 DIAGNOSIS — N200 Calculus of kidney: Secondary | ICD-10-CM

## 2021-03-26 LAB — URINALYSIS, COMPLETE
Bilirubin, UA: NEGATIVE
Leukocytes,UA: NEGATIVE
Nitrite, UA: NEGATIVE
Protein,UA: NEGATIVE
RBC, UA: NEGATIVE
Specific Gravity, UA: 1.02 (ref 1.005–1.030)
Urobilinogen, Ur: 0.2 mg/dL (ref 0.2–1.0)
pH, UA: 5.5 (ref 5.0–7.5)

## 2021-03-26 LAB — MICROSCOPIC EXAMINATION
Bacteria, UA: NONE SEEN
RBC, Urine: NONE SEEN /hpf (ref 0–2)

## 2021-03-26 NOTE — Telephone Encounter (Signed)
Pt aware and verbalized understanding.  

## 2021-03-26 NOTE — Telephone Encounter (Signed)
-----   Message from Hollice Espy, MD sent at 03/26/2021  4:46 PM EST ----- There appears to be possibly a tiny stone in the left lower kidney, possibly 3 mm.  I would recommend observation for this.  Hollice Espy, MD

## 2021-03-26 NOTE — Patient Instructions (Signed)
KUB today will call with results

## 2021-03-27 ENCOUNTER — Telehealth: Payer: Self-pay

## 2021-03-27 NOTE — Chronic Care Management (AMB) (Signed)
  Care Management   Note  03/27/2021 Name: Miguel [name] MRN: [id number] DOB: 1970/08/03  [name] is a 50 y.o. year old male who is a primary care patient of Olin Hauser, DO. I reached out to [name] by phone today in response to a referral sent by Mr. [name]'s primary care provider.   Mr. Thorstenson was given information about care management services today including:  Care management services include personalized support from designated clinical staff supervised by his physician, including individualized plan of care and coordination with other care providers 24/7 contact phone numbers for assistance for urgent and routine care needs. The patient may stop care management services at any time by phone call to the office staff.  Patient agreed to services and verbal consent obtained.   Follow up plan: Telephone appointment with care management team member scheduled for:04/03/2021  Noreene Larsson, [place], [place], [place] [address] Direct Dial: [phone] [email or web address]

## 2021-03-28 ENCOUNTER — Other Ambulatory Visit: Payer: Self-pay | Admitting: Family Medicine

## 2021-03-28 DIAGNOSIS — E783 Hyperchylomicronemia: Secondary | ICD-10-CM

## 2021-03-28 NOTE — Telephone Encounter (Signed)
Requested Prescriptions  Pending Prescriptions Disp Refills  . atorvastatin (LIPITOR) 20 MG tablet [Pharmacy Med Name: ATORVASTATIN 20MG  TABLETS] 90 tablet 3    Sig: TAKE 1 TABLET(20 MG) BY MOUTH AT BEDTIME     Cardiovascular:  Antilipid - Statins Failed - 03/28/2021  3:12 AM      Failed - HDL in normal range and within 360 days    HDL  Date Value Ref Range Status  03/19/2021 34 (L) > OR = 40 mg/dL Final         Failed - Triglycerides in normal range and within 360 days    Triglycerides  Date Value Ref Range Status  03/19/2021 237 (H) <150 mg/dL Final    Comment:    . If a non-fasting specimen was collected, consider repeat triglyceride testing on a fasting specimen if clinically indicated.  Yates Decamp et al. J. of Clin. Lipidol. 8101;7:510-258. Marland Kitchen          Passed - Total Cholesterol in normal range and within 360 days    Cholesterol  Date Value Ref Range Status  03/19/2021 158 <200 mg/dL Final         Passed - LDL in normal range and within 360 days    LDL Cholesterol (Calc)  Date Value Ref Range Status  03/19/2021 91 mg/dL (calc) Final    Comment:    Reference range: <100 . Desirable range <100 mg/dL for primary prevention;   <70 mg/dL for patients with CHD or diabetic patients  with > or = 2 CHD risk factors. Marland Kitchen LDL-C is now calculated using the Martin-Hopkins  calculation, which is a validated novel method providing  better accuracy than the Friedewald equation in the  estimation of LDL-C.  Cresenciano Genre et al. Annamaria Helling. 5277;824(23): 2061-2068  (http://education.[employer].com/faq/FAQ164)          Passed - Patient is not pregnant      Passed - Valid encounter within last 12 months    Recent Outpatient Visits          3 days ago Annual physical exam   [place], Devonne Doughty, DO   3 weeks ago History of nephrolithiasis   [place], Devonne Doughty, DO   4 months ago Type 2 diabetes mellitus with other  specified complication, with long-term current use of insulin [facility])   [place], DO   8 months ago Type 2 diabetes mellitus with other specified complication, with long-term current use of insulin ([place])   [facility] Olin Hauser, DO   1 year ago Annual physical exam   [place], DO      Future Appointments            In 3 months [place], Devonne Doughty, [facility], [place]   In 11 months Hollice Espy, MD [place]

## 2021-04-03 ENCOUNTER — Other Ambulatory Visit: Payer: Self-pay

## 2021-04-03 ENCOUNTER — Encounter: Payer: Self-pay | Admitting: Family Medicine

## 2021-04-03 ENCOUNTER — Ambulatory Visit: Payer: BC Managed Care – PPO | Admitting: Pharmacist

## 2021-04-03 DIAGNOSIS — E1169 Type 2 diabetes mellitus with other specified complication: Secondary | ICD-10-CM

## 2021-04-03 DIAGNOSIS — Z794 Long term (current) use of insulin: Secondary | ICD-10-CM

## 2021-04-03 MED ORDER — DEXCOM G6 TRANSMITTER MISC: 3 refills | Status: DC

## 2021-04-03 MED ORDER — METFORMIN HCL ER 500 MG PO TB24: 500.0000 mg | ORAL_TABLET | Freq: Every day | ORAL | 0 refills | Status: DC

## 2021-04-03 MED ORDER — TRESIBA FLEXTOUCH 100 UNIT/ML ~~LOC~~ SOPN: 20.0000 [IU] | PEN_INJECTOR | Freq: Every day | SUBCUTANEOUS | 0 refills | Status: DC

## 2021-04-03 MED ORDER — DEXCOM G6 SENSOR MISC: 12 refills | Status: DC

## 2021-04-03 NOTE — Chronic Care Management (AMB) (Signed)
Care Management   Pharmacy Note  04/03/2021 Name: BURMAN BRUINGTON MRN: [id number] DOB: May 14, 1971  Subjective: [name] is a 50 y.o. year old male who is a primary care patient of Olin Hauser, DO. The Care Management team was consulted for assistance with care management and care coordination needs.    Engaged with patient by telephone for initial visit in response to provider referral for pharmacy case management and/or care coordination services.   The patient was given information about Care Management services today including:  Care Management services includes personalized support from designated clinical staff supervised by the patient's primary care provider, including individualized plan of care and coordination with other care providers. 24/7 contact phone numbers for assistance for urgent and routine care needs. The patient may stop case management services at any time by phone call to the office staff.  Patient agreed to services and consent obtained.  Assessment:  Review of patient status, including review of consultants reports, laboratory and other test data, was performed as part of comprehensive evaluation and provision of chronic care management services.   SDOH (Social Determinants of Health) assessments and interventions performed:    Objective:  Lab Results  Component Value Date   CREATININE 0.72 03/19/2021   CREATININE 0.76 03/15/2020   CREATININE 0.81 03/15/2019    Lab Results  Component Value Date   HGBA1C 9.3 (H) 03/19/2021       Component Value Date/Time   CHOL 158 03/19/2021 0913   TRIG 237 (H) 03/19/2021 0913   HDL 34 (L) 03/19/2021 0913   CHOLHDL 4.6 03/19/2021 0913   VLDL 47 (H) 01/07/2016 0809   LDLCALC 91 03/19/2021 0913    BP Readings from Last 3 Encounters:  03/26/21 126/82  03/25/21 122/71  03/07/21 131/81    Care Plan  No Known Allergies  Medications Reviewed Today     Reviewed by Rennis Petty, RPH-CPP (Pharmacist) on 04/03/21 at 21  Med List Status: <None>   Medication Order Taking? Sig Documenting Provider Last Dose Status Informant  atorvastatin (LIPITOR) 20 MG tablet [id number] Yes TAKE 1 TABLET(20 MG) BY MOUTH AT BEDTIME Olin Hauser, DO Taking Active   B-D UF III MINI PEN NEEDLES 31G X 5 MM MISC [id number]  USE ONCE DAILY AS DIRECTED Olin Hauser, DO  Active   Cholecalciferol (D 1000) 1000 units capsule [id number] Yes Take by mouth.  [provider] Taking Active            Med Note Barnet Pall, JAMIE A   Thu May 10, 2015  9:44 AM) Received from: [place] (VITAMIN B-12) 5000 MCG SUBL [id number] Yes Place under the tongue. [provider] Taking Active            Med Note Barnet Pall, JAMIE A   Thu May 10, 2015  9:44 AM) Received from: [facility]  desonide (DESOWEN) 0.05 % cream [id number] Yes Apply topically daily as needed. Olin Hauser, DO Taking Active   gabapentin (NEURONTIN) 300 MG capsule [id number] Yes Take 300 mg by mouth continuous as needed.  [provider] Taking Active            Med Note Barnet Pall, JAMIE A   Thu May 10, 2015  9:44 AM) Received from: [facility]  ibuprofen (ADVIL,MOTRIN) 800 MG tablet [id number] No Take 800 mg by mouth every 6 (six) hours as needed.   Patient not taking: Reported on 04/03/2021  [provider] Not Taking Active            Med Note Barnet Pall, JAMIE A   Thu May 10, 2015  9:44 AM) Received from: [facility]  Insulin [place] [facility]) 100 UNIT/ML [id number]  Inject 20 Units into the skin daily. Karamalegos, Devonne Doughty, DO  Active   ketoconazole (NIZORAL) 2 % shampoo [id number] Yes Apply 1 application topically daily as needed. Olin Hauser, DO Taking Active   Lancets [facility] ULTRASOFT) lancets [id number]  Check blood sugar three times daily. Olin Hauser, DO  Active   lisinopril (ZESTRIL) 2.5 MG  tablet [id number] Yes TAKE 1 TABLET(2.5 MG) BY MOUTH DAILY [place] Devonne Doughty, DO Taking Active   Multiple Vitamins-Minerals (PRESERVISION AREDS PO) [id number] Yes Take by mouth. [provider] Taking Active   Ocrelizumab (OCREVUS IV) [id number] Yes Inject into the vein. [provider] Taking Active   [facility] ULTRA test strip [id number]  USE TO CHECK BLOOD SUGAR THREE TIMES DAILY AS DIRECTED [place] Devonne Doughty, DO  Active   OZEMPIC, 2 MG/DOSE, 8 MG/3ML SOPN [id number] Yes Inject 2 mg into the skin once a week. Olin Hauser, DO Taking Active   tamsulosin (FLOMAX) 0.4 MG CAPS capsule [id number] No TAKE ONE CAPSULE BY MOUTH DAILY AS NEEDED FOR KIDNEY STONE FOR 7 TO 14 DAYS  Patient not taking: No sig reported   Olin Hauser, DO Not Taking Active   tiZANidine (ZANAFLEX) 4 MG tablet [id number] No TAKE 1/2 TO 1 TABLET(2 TO 4 MG) BY MOUTH EVERY 8 HOURS AS NEEDED FOR MUSCLE SPASMS  Patient not taking: No sig reported   Olin Hauser, DO Not Taking Active   traMADol (ULTRAM) 50 MG tablet [id number] No Take 50 mg by mouth daily as needed.  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/28/2015   Peripheral neuropathic pain 02/28/2015   Groin pain 07/04/2014   Incomplete bladder emptying 07/04/2014   Calculus of kidney 74/82/7078   Renal colic 67/54/4920   OSA on CPAP 12/01/2012   Multiple sclerosis ([place]) 11/09/2012   Exomphalos 11/09/2012     Conditions to be addressed/monitored: HLD and DMII  Care Plan : PharmD - Medication Management/T2DM  Updates made by Rennis Petty, RPH-CPP since 04/03/2021 12:00 AM     Problem: Disease Progression      Long-Range Goal: Disease Progression Prevented or Minimized   Start Date: 04/03/2021  Expected End Date: 07/02/2021  This Visit's Progress: On track  Priority: High  Note:   Current Barriers:  Unable to achieve control of A1C  A1C 9.3% on 03/19/2021  Pharmacist Clinical Goal(s):  patient will achieve control of blood sugar as evidenced by A1C  through collaboration with PharmD and provider.    Interventions: 1:1 collaboration with Olin Hauser, DO regarding development and update of comprehensive plan of care as evidenced by provider attestation and co-signature Inter-disciplinary care team collaboration (see longitudinal plan of care) Receive  coordination of care message and referral from PCP Perform chart review. Patient seen for Office Visit with PCP on 11/7 for annual physical. Provider advised: Keep on insulin 20u - will switch to [place] sample today from [place], see if other brand works better Comprehensive medication review performed; medication list updated in electronic medical record Caution patient regarding risk of dizziness or sedation with use of gabapentin, tramadol and tizanidine, particularly if used in combination Patient denies dizziness or sedation with these medications, but reports uses each only as needed Reports uses weekly pillbox to organize his medications  Type 2 Diabetes: Uncontrolled; Current treatment: Ozempic 2 mg weekly on Sundays  Tresiba FlexTouch 20 units daily in evening (from sample) Reports previous therapies tried: metformin Reports recent home blood sugar readings ranging: morning fasting: 180-220; before supper: 210-230 Current dietary patterns: breakfast: oatmeal or low glucose Boost or bacon egg and cheese  biscuit with half bread; lunch: hibachi with little or none of the rice; supper: Chef salad with ranch dressing; snacks: occasionally cheese and peanut butter crackers or popcorn; drink: water;  sweet tea-3/4 unsweet tea Discuss impact of diet, exercise and weight loss on blood sugar control Discuss importance of having regular well-balanced meals throughout the day and limiting carbohydrate portion sizes Exercise: Reports is limited by pain related to hernia, but active through the day at work, also hunting and fishing Reports working on weight loss. Reports current home weight: 240 lbs Reports Ozempic 2 mg weekly helping with appetite control/weight loss Reports blood sugar control impacted by receiving steroid infusions and injections: Reports receives steroid injections ~every 3 months for pain management and steroid infusions every 6 months with multiple sclerosis treatment (Ocrelizumab infusion) Patient interested in trying continuous glucose monitor as tool to aid with blood sugar control Based on review of formulary for patient's pharmacy health plan benefit from [place] website today, Dexcom G6 CGM preferred as a tier 2 option under patient's plan, requiring prior authorization Patient plans to use Dexcom App on iPhone in place of reader Collaborate with PCP today to request provider send Rx for Dexcom G6 receivers and sensors to pharmacy for patient Will follow up regarding prior authorization Reports recent difficulty with obtaining Ozempic 2 mg dose at local pharmacies due to shortage Assist patient with locating 61-month supply today at [facility]. Request pharmacy transfer and fill Rx for patient Reports stopped using Basaglar and started [place] 20 units daily from sample as directed by PCP Patient interested in continuing Tresiba 20 units daily CPP will send Rx for [place] Aas FlexTouch to pharmacy Patient interested in improving blood  sugar control. Discuss medication management options.  Patient interested in re-trying metformin. Attributes GI symptoms with metformin in past to quick dose increase Will have patient try: metformin ER 500 mg daily with supper - CPP sends Rx to pharmacy Counsel patient on s/s of low blood sugar and how to treat lows Review rules of 15s - importance of using 15 grams of sugar to treat low, recheck blood sugar in 15 minutes, treat again if remains low or, if back to normal, having meal if mealtime or snack  Review examples of sources of 15 grams of sugar Collaborate with PCP  Hyperlipidemia: Current treatment: atorvastatin 20 mg daily  Patient Goals/Self-Care Activities patient will:  - check glucose, document, and provide at future appointments - collaborate with provider on medication access solutions       Plan: Telephone follow up appointment with care management team  member scheduled for:  04/17/2021 at 12:30 PM  Wallace Cullens, PharmD, [place], CPP Clinical Pharmacist [facility] [phone]

## 2021-04-03 NOTE — Patient Instructions (Signed)
Thank you allowing the Care Management Team to be a part of your care! It was a pleasure speaking with you today!     Care Management Team    Noreene Larsson RN, MSN, CCM Nurse Care Coordinator  [phone]   Wallace Cullens PharmD  Clinical Pharmacist  [phone]   Christa See, MSW, LCSW Clinical Social Worker [phone]  Visit Information   Goals Addressed             This Visit's Progress    Pharmacy Goals       Our goal A1c is less than 7%. This corresponds with fasting sugars less than 130 and 2 hour after meal sugars less than 180. Please check your blood sugar and keep a log of the results  Our goal bad cholesterol, or LDL, is less than 70 . This is why it is important to continue taking your atorvastatin.   Feel free to call me with any questions or concerns. I look forward to our next call!  Wallace Cullens, PharmD, [place], CPP Clinical Pharmacist [facility] [phone]        Mr. Sinclair was given information about Care Management services today including:  Care Management services include personalized support from designated clinical staff supervised by his physician, including individualized plan of care and coordination with other care providers 24/7 contact phone numbers for assistance for urgent and routine care needs. The patient may stop CCM services at any time (effective at the end of the month) by phone call to the office staff.  Patient agreed to services and verbal consent obtained.   Patient verbalizes understanding of instructions provided today and agrees to view in [place].   Telephone follow up appointment with care management team member scheduled for: 04/17/2021 at 12:30 PM

## 2021-04-05 ENCOUNTER — Ambulatory Visit: Payer: Self-pay | Admitting: Pharmacist

## 2021-04-05 DIAGNOSIS — Z794 Long term (current) use of insulin: Secondary | ICD-10-CM

## 2021-04-05 NOTE — Chronic Care Management (AMB) (Signed)
Care Management   Pharmacy Note  04/05/2021 Name: Miguel [name] MRN: [id number] DOB: 12-Dec-1970  Subjective: Miguel [name] is a 50 y.o. year old male who is a primary care patient of Olin Hauser, DO. The Care Management team was consulted for assistance with care management and care coordination needs.    Collaboration with [place]  for follow up in response to provider referral for pharmacy case management and/or care coordination services.   The patient was given information about Care Management services today including:  Care Management services includes personalized support from designated clinical staff supervised by the patient's primary care provider, including individualized plan of care and coordination with other care providers. 24/7 contact phone numbers for assistance for urgent and routine care needs. The patient may stop case management services at any time by phone call to the office staff.  Patient agreed to services and consent obtained.  Assessment:  Review of patient status, including review of consultants reports, laboratory and other test data, was performed as part of comprehensive evaluation and provision of chronic care management services.   SDOH (Social Determinants of Health) assessments and interventions performed:    Objective:  Lab Results  Component Value Date   CREATININE 0.72 03/19/2021   CREATININE 0.76 03/15/2020   CREATININE 0.81 03/15/2019    Lab Results  Component Value Date   HGBA1C 9.3 (H) 03/19/2021     Care Plan  No Known Allergies  Medications Reviewed Today     Reviewed by Rennis Petty, RPH-CPP (Pharmacist) on 04/03/21 at 1530  Med List Status: <None>   Medication Order Taking? Sig Documenting Provider Last Dose Status Informant  atorvastatin (LIPITOR) 20 MG tablet [id number] Yes TAKE 1 TABLET(20 MG) BY MOUTH AT BEDTIME Olin Hauser, DO Taking Active   B-D UF III MINI PEN  NEEDLES 31G X 5 MM MISC [id number]  USE ONCE DAILY AS DIRECTED Olin Hauser, DO  Active   Cholecalciferol (D 1000) 1000 units capsule [id number] Yes Take by mouth.  [provider] Taking Active            Med Note Barnet Pall, JAMIE A   Thu May 10, 2015  9:44 AM) Received from: [place] (VITAMIN B-12) 5000 MCG SUBL [id number] Yes Place under the tongue. [provider] Taking Active            Med Note Barnet Pall, JAMIE A   Thu May 10, 2015  9:44 AM) Received from: [facility]  desonide (DESOWEN) 0.05 % cream [id number] Yes Apply topically daily as needed. Olin Hauser, DO Taking Active   gabapentin (NEURONTIN) 300 MG capsule [id number] Yes Take 300 mg by mouth continuous as needed.  [provider] Taking Active            Med Note Barnet Pall, JAMIE A   Thu May 10, 2015  9:44 AM) Received from: [facility]  ibuprofen (ADVIL,MOTRIN) 800 MG tablet [id number] No Take 800 mg by mouth every 6 (six) hours as needed.   Patient not taking: Reported on 04/03/2021   [provider] Not Taking Active            Med Note Barnet Pall, JAMIE A   Thu May 10, 2015  9:44 AM) Received from: [facility]  Insulin Glargine [facility]) 100 UNIT/ML [id number]  Inject 20 Units into the skin daily. Karamalegos, Devonne Doughty, DO  Active   ketoconazole (NIZORAL) 2 % shampoo  [id number] Yes Apply 1 application topically daily as needed. Olin Hauser, DO Taking Active   Lancets [facility] ULTRASOFT) lancets [id number]  Check blood sugar three times daily. Olin Hauser, DO  Active   lisinopril (ZESTRIL) 2.5 MG tablet [id number] Yes TAKE 1 TABLET(2.5 MG) BY MOUTH DAILY [place] Devonne Doughty, DO Taking Active   Multiple Vitamins-Minerals (PRESERVISION AREDS PO) [id number] Yes Take by mouth. [provider] Taking Active   Ocrelizumab (OCREVUS IV) [id number] Yes Inject into the vein. [provider] Taking  Active   [facility] ULTRA test strip [id number]  USE TO CHECK BLOOD SUGAR THREE TIMES DAILY AS DIRECTED [place] Devonne Doughty, DO  Active   OZEMPIC, 2 MG/DOSE, 8 MG/3ML SOPN [id number] Yes Inject 2 mg into the skin once a week. Olin Hauser, DO Taking Active   tamsulosin (FLOMAX) 0.4 MG CAPS capsule [id number] No TAKE ONE CAPSULE BY MOUTH DAILY AS NEEDED FOR KIDNEY STONE FOR 7 TO 14 DAYS  Patient not taking: No sig reported   Olin Hauser, DO Not Taking Active   tiZANidine (ZANAFLEX) 4 MG tablet [id number] No TAKE 1/2 TO 1 TABLET(2 TO 4 MG) BY MOUTH EVERY 8 HOURS AS NEEDED FOR MUSCLE SPASMS  Patient not taking: No sig reported   Olin Hauser, DO Not Taking Active   traMADol (ULTRAM) 50 MG tablet [id number] No Take 50 mg by mouth daily as needed.  Patient not taking: Reported on 04/03/2021   [provider] Not Taking Active             Patient Active Problem List   Diagnosis Date Noted   Foreign body in right foot 12/09/2019   Immunization due 12/09/2019   Myofascial pain 10/17/2016   Gross hematuria 08/27/2016   Inguinal neuralgia 05/05/2016   Hyperlipidemia associated with type 2 diabetes mellitus ([place]) 01/14/2016   Type 2 diabetes mellitus with other specified complication ([place]) 04/09/6243   Hip pain 10/26/2015   Shoulder pain 07/23/2015   Chronic prostatitis 02/28/2015   Peripheral neuropathic pain 02/28/2015   Groin pain 07/04/2014   Incomplete bladder emptying 07/04/2014   Calculus of kidney 69/50/7225   Renal colic 75/09/1831   OSA on CPAP 12/01/2012   Multiple sclerosis ([place]) 11/09/2012   Exomphalos 11/09/2012    Care Plan : PharmD - Medication Management/T2DM  Updates made by Rennis Petty, RPH-CPP since 04/05/2021 12:00 AM     Problem: Disease Progression      Long-Range Goal: Disease Progression Prevented or Minimized   Start Date: 04/03/2021  Expected End Date: 07/02/2021  Recent Progress: On track   Priority: High  Note:   Current Barriers:  Unable to achieve control of A1C  A1C 9.3% on 03/19/2021  Pharmacist Clinical Goal(s):  patient will achieve control of blood sugar as evidenced by A1C  through collaboration with PharmD and provider.    Interventions: 1:1 collaboration with Olin Hauser, DO regarding development and update of comprehensive plan of care as evidenced by provider attestation and co-signature Inter-disciplinary care team collaboration (see longitudinal plan of care)  Type 2 Diabetes: Uncontrolled; Current treatment: Ozempic 2 mg weekly on Sundays  Tresiba FlexTouch 20 units daily in evening metformin ER 500 mg daily with supper (prescription sent on 11/16) Patient interested in trying continuous glucose monitor as tool to aid with blood sugar control Based on review of formulary for patient's pharmacy health plan benefit from [place] website, Dexcom G6 CGM preferred as a tier 2 option under  patient's plan, requiring prior authorization Collaborate with PCP on 11/16 to request Rx for Dexcom G6 receivers and sensors to sent pharmacy for patient Followed up with [facility] RPh for status of Dexcom G6 transmitter and sensor Rxs. [facility] RPh reports processing of Dexcom CGM devices first has to go through [employer] internal processing team. Request RPh reprocess. Both Rx now requiring prior authorization as anticipated Today CM Pharmacist follows up in CoverMyMeds for prior authorizations (PAs). Find PAs requests already completed and sent back to plan by clinic team today.  Patient Goals/Self-Care Activities patient will:  - check glucose, document, and provide at future appointments - collaborate with provider on medication access solutions       Plan: CM Pharmacist will follow up on status of prior authorization for CGM within the next 7 days.  Telephone follow up appointment with care management team member scheduled for:   04/17/2021 12:30 PM  Wallace Cullens, PharmD, Para [place], [place] [phone]

## 2021-04-10 ENCOUNTER — Ambulatory Visit: Payer: BC Managed Care – PPO | Admitting: Pharmacist

## 2021-04-10 DIAGNOSIS — Z794 Long term (current) use of insulin: Secondary | ICD-10-CM

## 2021-04-10 DIAGNOSIS — E1169 Type 2 diabetes mellitus with other specified complication: Secondary | ICD-10-CM

## 2021-04-10 NOTE — Chronic Care Management (AMB) (Signed)
Care Management   Pharmacy Note  04/10/2021 Name: Miguel [name] MRN: [id number] DOB: 1971/03/26  Subjective: Miguel [name] is a 50 y.o. year old male who is a primary care patient of Olin Hauser, DO. The Care Management team was consulted for assistance with care management and care coordination needs.    Engaged with patient by telephone for follow up visit in response to provider referral for pharmacy case management and/or care coordination services.   The patient was given information about Care Management services today including:  Care Management services includes personalized support from designated clinical staff supervised by the patient's primary care provider, including individualized plan of care and coordination with other care providers. 24/7 contact phone numbers for assistance for urgent and routine care needs. The patient may stop case management services at any time by phone call to the office staff.  Patient agreed to services and consent obtained.  Assessment:  Review of patient status, including review of consultants reports, laboratory and other test data, was performed as part of comprehensive evaluation and provision of chronic care management services.   SDOH (Social Determinants of Health) assessments and interventions performed:    Objective:  Lab Results  Component Value Date   CREATININE 0.72 03/19/2021   CREATININE 0.76 03/15/2020   CREATININE 0.81 03/15/2019    Lab Results  Component Value Date   HGBA1C 9.3 (H) 03/19/2021       Component Value Date/Time   CHOL 158 03/19/2021 0913   TRIG 237 (H) 03/19/2021 0913   HDL 34 (L) 03/19/2021 0913   CHOLHDL 4.6 03/19/2021 0913   VLDL 47 (H) 01/07/2016 0809   LDLCALC 91 03/19/2021 0913    BP Readings from Last 3 Encounters:  03/26/21 126/82  03/25/21 122/71  03/07/21 131/81    Care Plan  No Known Allergies  Medications Reviewed Today     Reviewed by Rennis Petty, RPH-CPP (Pharmacist) on 04/10/21 at 1107  Med List Status: <None>   Medication Order Taking? Sig Documenting Provider Last Dose Status Informant  atorvastatin (LIPITOR) 20 MG tablet [id number]  TAKE 1 TABLET(20 MG) BY MOUTH AT BEDTIME Olin Hauser, DO  Active   B-D UF III MINI PEN NEEDLES 31G X 5 MM MISC [id number]  USE ONCE DAILY AS DIRECTED Olin Hauser, DO  Active   Cholecalciferol (D 1000) 1000 units capsule [id number]  Take by mouth.  [provider]  Active            Med Note Barnet Pall, JAMIE A   Thu May 10, 2015  9:44 AM) Received from: [facility]  Continuous Blood Gluc Sensor (DEXCOM G6 SENSOR) [place] [id number]  Use as directed Olin Hauser, DO  Active   Continuous Blood Gluc Transmit (DEXCOM G6 TRANSMITTER) MISC [id number]  Use as directed Olin Hauser, DO  Active   Cyanocobalamin (VITAMIN B-12) 5000 MCG SUBL [id number]  Place under the tongue. [provider]  Active            Med Note Barnet Pall, JAMIE A   Thu May 10, 2015  9:44 AM) Received from: [facility]  desonide (DESOWEN) 0.05 % cream [id number]  Apply topically daily as needed. Karamalegos, Devonne Doughty, DO  Active   gabapentin (NEURONTIN) 300 MG capsule [id number]  Take 300 mg by mouth continuous as needed.  [provider]  Active            Med Note (Barnet Pall, JAMIE A  Thu May 10, 2015  9:44 AM) Received from: [facility]  ibuprofen (ADVIL,MOTRIN) 800 MG tablet [id number]  Take 800 mg by mouth every 6 (six) hours as needed.   Patient not taking: Reported on 04/03/2021   [provider]  Active            Med Note Barnet Pall, JAMIE A   Thu May 10, 2015  9:44 AM) Received from: [facility]  insulin degludec (TRESIBA FLEXTOUCH) 100 UNIT/ML FlexTouch Pen [id number] Yes Inject 20 Units into the skin daily. Olin Hauser, DO Taking Active   ketoconazole (NIZORAL) 2 % shampoo [id number]  Apply 1 application topically daily as  needed. Olin Hauser, DO  Active   Lancets [facility] ULTRASOFT) lancets [id number]  Check blood sugar three times daily. Olin Hauser, DO  Active   lisinopril (ZESTRIL) 2.5 MG tablet [id number]  TAKE 1 TABLET(2.5 MG) BY MOUTH DAILY Karamalegos, Devonne Doughty, DO  Active   metFORMIN (GLUCOPHAGE-XR) 500 MG 24 hr tablet [id number] Yes Take 1 tablet (500 mg total) by mouth daily with supper. Olin Hauser, DO Taking Active   Multiple Vitamins-Minerals (PRESERVISION AREDS PO) [id number]  Take by mouth. [provider]  Active   Ocrelizumab (OCREVUS IV) [id number]  Inject into the vein. [provider]  Active   Donald Siva test strip [id number]  USE TO CHECK BLOOD SUGAR THREE TIMES DAILY AS DIRECTED [place] Devonne Doughty, DO  Active   OZEMPIC, 2 MG/DOSE, 8 MG/3ML SOPN [id number] Yes Inject 2 mg into the skin once a week. Olin Hauser, DO Taking Active   tamsulosin (FLOMAX) 0.4 MG CAPS capsule [id number]  TAKE ONE CAPSULE BY MOUTH DAILY AS NEEDED FOR KIDNEY STONE FOR 7 TO 14 DAYS  Patient not taking: No sig reported   Olin Hauser, DO  Active   tiZANidine (ZANAFLEX) 4 MG tablet [id number]  TAKE 1/2 TO 1 TABLET(2 TO 4 MG) BY MOUTH EVERY 8 HOURS AS NEEDED FOR MUSCLE SPASMS  Patient not taking: No sig reported   Olin Hauser, DO  Active   traMADol (ULTRAM) 50 MG tablet [id number]  Take 50 mg by mouth daily as needed.  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   Peripheral neuropathic pain 02/28/2015   Groin pain 07/04/2014   Incomplete bladder emptying 07/04/2014   Calculus of kidney 62/83/6629   Renal colic 47/65/4650   OSA on CPAP 12/01/2012   Multiple sclerosis ([place]) 11/09/2012   Exomphalos 11/09/2012    Care Plan : PharmD - Medication Management/T2DM  Updates made by Rennis Petty, RPH-CPP since 04/10/2021 12:00 AM     Problem: Disease Progression      Long-Range Goal: Disease Progression Prevented or Minimized   Start Date: 04/03/2021  Expected End Date: 07/02/2021  This Visit's Progress: On track  Recent Progress: On track  Priority: High  Note:   Current Barriers:  Unable to achieve control of A1C  A1C 9.3% on 03/19/2021 PA approved for Dexcom G6 Continuous Glucose Monitoring (CGM) - 03/2021  Pharmacist Clinical Goal(s):  patient will achieve control of blood sugar as evidenced by A1C  through collaboration with PharmD and provider.    Interventions: 1:1 collaboration with Olin Hauser, DO regarding development and update of comprehensive plan of care as evidenced by provider attestation and co-signature Inter-disciplinary care team collaboration (see longitudinal plan of care)  Type 2 Diabetes: Uncontrolled; Current treatment: Ozempic 2 mg weekly on Sundays  Tresiba FlexTouch 20 units daily in evening Metformin ER 500 mg daily with supper (started ~11/19) Reports started metformin ER 500 mg daily with supper this weekend. Reports has missed one dose, but tolerating well Confirms picked up 30 day supply of Ozempic 2 mg dose from [place] impact of diet, exercise and weight loss on blood sugar control Discuss importance of having regular well-balanced meals throughout the day and limiting carbohydrate portion sizes Exercise: Reports is limited by pain related to hernia, but active through the day at work, also hunting and  fishing Reports working on weight loss. Reports current home weight: 240 lbs Reports Ozempic 2 mg weekly helping with appetite control/weight loss Reports blood sugar control impacted by receiving steroid infusions and injections: Reports receives steroid injections ~every 3 months for pain management and steroid infusions every 6 months with multiple sclerosis treatment (Ocrelizumab infusion) Reports follows direction from PCP for how can adjust long-acting insulin on days of steroid infusion/injection Patient reports has downloaded Dexcom G6 app to his smart phone and picked up Rx for Dexcom G6 sensors Collaborate with [place] to confirm [facility] G6 transmitter Rx also available for pick up Patient reports will pick up transmitter Rx and plan to start Dexcom G6 monitoring today Counsel patient on using Dexcom CGM and send patient link for Dexcom G6 Getting Started Video for further education on using device Reports has currently set his low alert for 100 and high alert for 350. Discuss planning to adjust this range in the future Patient to contact Dexcom support or CM Pharmacist for support Send patient link via email to share Dexcom CGM data via [place] Denies s/s of hypoglycemia Counsel patient on s/s of low blood sugar and how to treat lows   Hyperlipidemia: Current treatment: atorvastatin 20 mg daily   Patient Goals/Self-Care Activities patient will:  - check glucose, document, and provide at future appointments - collaborate with provider on medication access solutions      Plan: Telephone follow up appointment with care management team member scheduled for:  11/30 at 12:30 pm  Wallace Cullens, PharmD, BCACP, [place] [phone]

## 2021-04-10 NOTE — Patient Instructions (Signed)
Thank you allowing the Care Management Team to be a part of your care! It was a pleasure speaking with you today!     Care Management Team    Noreene Larsson RN, MSN, CCM Nurse Care Coordinator  [phone]   Wallace Cullens, PharmD  Clinical Pharmacist  [phone]   Christa See, MSW, LCSW Clinical Social Worker [phone]   Visit Information   Goals Addressed             This Visit's Progress    Pharmacy Goals       Our goal A1c is less than 7%. This corresponds with fasting sugars less than 130 and 2 hour after meal sugars less than 180. Please check your blood sugar and keep a log of the results  Our goal bad cholesterol, or LDL, is less than 70 . This is why it is important to continue taking your atorvastatin.  You can access the Dexcom G6 Getting Started video at: [email or web address]/en-us/training-videos  You can access Dexcom Support at:  https://www.dexcom.com/en-us/support   Feel free to call me with any questions or concerns. I look forward to our next call!   Wallace Cullens, PharmD, [place], CPP Clinical Pharmacist [facility] [phone]        The patient verbalized understanding of instructions, educational materials, and care plan provided today and declined offer to receive copy of patient instructions, educational materials, and care plan.   Telephone follow up appointment with care management team member scheduled for: 11/30 at 12:30 pm

## 2021-04-17 ENCOUNTER — Ambulatory Visit: Payer: BC Managed Care – PPO | Admitting: Pharmacist

## 2021-04-17 DIAGNOSIS — E1169 Type 2 diabetes mellitus with other specified complication: Secondary | ICD-10-CM

## 2021-04-17 DIAGNOSIS — Z794 Long term (current) use of insulin: Secondary | ICD-10-CM

## 2021-04-17 NOTE — Patient Instructions (Signed)
Thank you allowing the Care Management Team to be a part of your care! It was a pleasure speaking with you today!     Care Management Team    Noreene Larsson RN, MSN, CCM Nurse Care Coordinator  [phone]   Wallace Cullens, PharmD  Clinical Pharmacist  [phone]   Christa See, MSW, LCSW Clinical Social Worker [phone]   Visit Information   Goals Addressed             This Visit's Progress    Pharmacy Goals       Our goal A1c is less than 7%. This corresponds with fasting sugars less than 130 and 2 hour after meal sugars less than 180. Please check your blood sugar and keep a log of the results  Our goal bad cholesterol, or LDL, is less than 70 . This is why it is important to continue taking your atorvastatin.  You can access the Dexcom G6 Getting Started video at: [email or web address].[email or web address]/en-us/training-videos  You can access Dexcom Support at:  https://www.dexcom.com/en-us/support  Feel free to call me with any questions or concerns. I look forward to our next call!   Wallace Cullens, PharmD, [place], CPP Clinical Pharmacist [facility] [phone]        The patient verbalized understanding of instructions, educational materials, and care plan provided today and declined offer to receive copy of patient instructions, educational materials, and care plan.   Telephone follow up appointment with care management team member scheduled for: 05/22/2021 at 1:45 pm

## 2021-04-17 NOTE — Chronic Care Management (AMB) (Signed)
Care Management   Pharmacy Note  04/17/2021 Name: DELON REVELO MRN: [id number] DOB: Feb 03, 1971  Subjective: [name] is a 50 y.o. year old male who is a primary care patient of Olin Hauser, DO. The Care Management team was consulted for assistance with care management and care coordination needs.    Engaged with patient by telephone for follow up visit in response to provider referral for pharmacy case management and/or care coordination services.   The patient was given information about Care Management services today including:  Care Management services includes personalized support from designated clinical staff supervised by the patient's primary care provider, including individualized plan of care and coordination with other care providers. 24/7 contact phone numbers for assistance for urgent and routine care needs. The patient may stop case management services at any time by phone call to the office staff.  Patient agreed to services and consent obtained.  Assessment:  Review of patient status, including review of consultants reports, laboratory and other test data, was performed as part of comprehensive evaluation and provision of chronic care management services.   SDOH (Social Determinants of Health) assessments and interventions performed:    Objective:  Lab Results  Component Value Date   CREATININE 0.72 03/19/2021   CREATININE 0.76 03/15/2020   CREATININE 0.81 03/15/2019    Lab Results  Component Value Date   HGBA1C 9.3 (H) 03/19/2021       Component Value Date/Time   CHOL 158 03/19/2021 0913   TRIG 237 (H) 03/19/2021 0913   HDL 34 (L) 03/19/2021 0913   CHOLHDL 4.6 03/19/2021 0913   VLDL 47 (H) 01/07/2016 0809   LDLCALC 91 03/19/2021 0913     BP Readings from Last 3 Encounters:  03/26/21 126/82  03/25/21 122/71  03/07/21 131/81    Care Plan  No Known Allergies  Medications Reviewed Today     Reviewed by Rennis Petty, RPH-CPP (Pharmacist) on 04/10/21 at 1107  Med List Status: <None>   Medication Order Taking? Sig Documenting Provider Last Dose Status Informant  atorvastatin (LIPITOR) 20 MG tablet [id number]  TAKE 1 TABLET(20 MG) BY MOUTH AT BEDTIME Olin Hauser, DO  Active   B-D UF III MINI PEN NEEDLES 31G X 5 MM MISC [id number]  USE ONCE DAILY AS DIRECTED Olin Hauser, DO  Active   Cholecalciferol (D 1000) 1000 units capsule [id number]  Take by mouth.  [provider]  Active            Med Note Barnet Pall, JAMIE A   Thu May 10, 2015  9:44 AM) Received from: [facility]  Continuous Blood Gluc Sensor (DEXCOM G6 SENSOR) [place] [id number]  Use as directed Olin Hauser, DO  Active   Continuous Blood Gluc Transmit (DEXCOM G6 TRANSMITTER) MISC [id number]  Use as directed Olin Hauser, DO  Active   Cyanocobalamin (VITAMIN B-12) 5000 MCG SUBL [id number]  Place under the tongue. [provider]  Active            Med Note Barnet Pall, JAMIE A   Thu May 10, 2015  9:44 AM) Received from: [facility]  desonide (DESOWEN) 0.05 % cream [id number]  Apply topically daily as needed. Karamalegos, Devonne Doughty, DO  Active   gabapentin (NEURONTIN) 300 MG capsule [id number]  Take 300 mg by mouth continuous as needed.  [provider]  Active            Med Note (Barnet Pall, JAMIE  A   Thu May 10, 2015  9:44 AM) Received from: [facility]  ibuprofen (ADVIL,MOTRIN) 800 MG tablet [id number]  Take 800 mg by mouth every 6 (six) hours as needed.   Patient not taking: Reported on 04/03/2021   [provider]  Active            Med Note Barnet Pall, JAMIE A   Thu May 10, 2015  9:44 AM) Received from: [facility]  insulin degludec (TRESIBA FLEXTOUCH) 100 UNIT/ML FlexTouch Pen [id number] Yes Inject 20 Units into the skin daily. Olin Hauser, DO Taking Active   ketoconazole (NIZORAL) 2 % shampoo [id number]  Apply 1 application topically daily as  needed. Olin Hauser, DO  Active   Lancets [facility] ULTRASOFT) lancets [id number]  Check blood sugar three times daily. Olin Hauser, DO  Active   lisinopril (ZESTRIL) 2.5 MG tablet [id number]  TAKE 1 TABLET(2.5 MG) BY MOUTH DAILY Karamalegos, Devonne Doughty, DO  Active   metFORMIN (GLUCOPHAGE-XR) 500 MG 24 hr tablet [id number] Yes Take 1 tablet (500 mg total) by mouth daily with supper. Olin Hauser, DO Taking Active   Multiple Vitamins-Minerals (PRESERVISION AREDS PO) [id number]  Take by mouth. [provider]  Active   Ocrelizumab (OCREVUS IV) [id number]  Inject into the vein. [provider]  Active   Donald Siva test strip [id number]  USE TO CHECK BLOOD SUGAR THREE TIMES DAILY AS DIRECTED [place] Devonne Doughty, DO  Active   OZEMPIC, 2 MG/DOSE, 8 MG/3ML SOPN [id number] Yes Inject 2 mg into the skin once a week. Olin Hauser, DO Taking Active   tamsulosin (FLOMAX) 0.4 MG CAPS capsule [id number]  TAKE ONE CAPSULE BY MOUTH DAILY AS NEEDED FOR KIDNEY STONE FOR 7 TO 14 DAYS  Patient not taking: No sig reported   Olin Hauser, DO  Active   tiZANidine (ZANAFLEX) 4 MG tablet [id number]  TAKE 1/2 TO 1 TABLET(2 TO 4 MG) BY MOUTH EVERY 8 HOURS AS NEEDED FOR MUSCLE SPASMS  Patient not taking: No sig reported   Olin Hauser, DO  Active   traMADol (ULTRAM) 50 MG tablet [id number]  Take 50 mg by mouth daily as needed.  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   Peripheral neuropathic pain 02/28/2015   Groin pain 07/04/2014   Incomplete bladder emptying 07/04/2014   Calculus of kidney 92/03/9416   Renal colic 40/81/4481   OSA on CPAP 12/01/2012   Multiple sclerosis ([place]) 11/09/2012   Exomphalos 11/09/2012     Care Plan : PharmD - Medication Management/T2DM  Updates made by Rennis Petty, RPH-CPP since 04/17/2021 12:00 AM     Problem: Disease Progression      Long-Range Goal: Disease Progression Prevented or Minimized   Start Date: 04/03/2021  Expected End Date: 07/02/2021  This Visit's Progress: On track  Recent Progress: On track  Priority: High  Note:   Current Barriers:  Unable to achieve  control of A1C  A1C 9.3% on 03/19/2021 PA approved for Dexcom G6 Continuous Glucose Monitoring (CGM) - 03/2021  Pharmacist Clinical Goal(s):  patient will achieve control of blood sugar as evidenced by A1C  through collaboration with PharmD and provider.    Interventions: 1:1 collaboration with Olin Hauser, DO regarding development and update of comprehensive plan of care as evidenced by provider attestation and co-signature Inter-disciplinary care team collaboration (see longitudinal plan of care)  Type 2 Diabetes: Uncontrolled; Current treatment: Ozempic 2 mg weekly on Sundays  Tresiba FlexTouch 20 units daily in evening Metformin ER 500 mg daily with supper (started ~11/19) Reports tolerating metformin well Patient started using Dexcom G6 Continuous Glucose Meter (CGM) Reports has currently set his low alert for 100 and moved high alert to 300 Confirms received link via email from [place] for sharing Dexcom CGM data via [place] to download [place] Reports recent blood sugar readings ranging: morning fasting: 147-210; after meals: 280-290 Denies s/s of hypoglycemia Have counseled patient on s/s of low blood sugar and how to treat  lows Patient using CGM as tool to reinforce/aid with dietary choices and improve blood sugar control Discuss impact of diet, exercise, weight loss on blood sugar control Discuss importance of having regular well-balanced meals throughout the day and limiting carbohydrate portion sizes Exercise: Reports is limited by pain related to hernia, but active through the day at work, also hunting and fishing Reported working on weight loss Reported Ozempic 2 mg weekly helping with appetite control/weight loss Notes blood sugar control impacted by receiving steroid infusions and injections: Receives steroid injections ~every 3 months for pain management and steroid infusions every 6 months with multiple sclerosis treatment (Ocrelizumab infusion) Reported follows direction from PCP for how can adjust long-acting insulin on days of steroid infusion/injection   Hyperlipidemia: Current treatment: atorvastatin 20 mg daily   Patient Goals/Self-Care Activities patient will:  - check glucose, document, and provide at future appointments - collaborate with provider on medication access solutions      Plan: Telephone follow up appointment with care management team member scheduled for:  05/22/2021 at 1:45 pm  Wallace Cullens, PharmD, BCACP, [facility] [phone]

## 2021-05-16 ENCOUNTER — Encounter: Payer: Self-pay | Admitting: Family Medicine

## 2021-05-16 ENCOUNTER — Ambulatory Visit (INDEPENDENT_AMBULATORY_CARE_PROVIDER_SITE_OTHER): Payer: BC Managed Care – PPO | Admitting: Family Medicine

## 2021-05-16 ENCOUNTER — Other Ambulatory Visit: Payer: Self-pay

## 2021-05-16 VITALS — BP 118/69 | HR 83 | Temp 97.9°F | Resp 16 | Ht 70.0 in | Wt 241.0 lb

## 2021-05-16 DIAGNOSIS — J320 Chronic maxillary sinusitis: Secondary | ICD-10-CM

## 2021-05-16 DIAGNOSIS — U071 COVID-19: Secondary | ICD-10-CM

## 2021-05-16 DIAGNOSIS — E1165 Type 2 diabetes mellitus with hyperglycemia: Secondary | ICD-10-CM

## 2021-05-16 MED ORDER — NIRMATRELVIR 300 MG (150 MG X2)-RITONAVIR 100 MG TABLET,DOSE PACK
3.0000 | ORAL_TABLET | Freq: Two times a day (BID) | ORAL | 0 refills | Status: AC
Start: 2021-05-16 — End: 2021-05-21

## 2021-05-16 MED ORDER — AZITHROMYCIN 250 MG TABLET
ORAL_TABLET | ORAL | 0 refills | Status: AC
Start: 2021-05-16 — End: ?

## 2021-05-16 NOTE — Progress Notes (Signed)
FAMILY MEDICINE, [facility]  [address]  [place] [address]       Name: Francisco Jenkins MRN:  [id number]   Date: 05/16/2021 Age: 50 y.o.     1971/05/13      Chief Complaint   Patient presents with    Drainage Back Of Throat     Rm 5   red eyes, teeth and jaw pain.          HPI:  Francisco Jenkins is a 50 y.o. year old male who has drainage, sore throat, sinus pressure (hurts to teeth)    Positive home covid today x 2    ROS:  +sore throat, drainage, sinus pressure  Denies cough, chest congestion, shortness of breath, body aches, fever      Vitals:    05/16/21 1231   BP: 118/69   Pulse: 83   Resp: 16   Temp: 36.6 C (97.9 F)   SpO2: 97%   Weight: 109 kg (241 lb)   Height: 1.778 m (5\' 10" )   BMI: 34.65        Past Medical History  Current Outpatient Medications   Medication Sig    atorvastatin (LIPITOR) 20 mg Oral Tablet Take 1 Tablet (20 mg total) by mouth Once a day    azithromycin (ZITHROMAX) 250 mg Oral Tablet Take 500 mg (2 tab) on day 1; take 250 mg (1 tab) on days 2-5.    baclofen (LIORESAL) 10 mg Oral Tablet Take 1 Tablet (10 mg total) by mouth    BD UF MINI PEN NEEDLE 31 gauge x 3/16" Needle USE ONCE DAILY AS DIRECTED    Blood Sugar Diagnostic Strip USE TO CHECK BLOOD SUGAR THREE TIMES DAILY AS DIRECTED    Cholecalciferol, Vitamin D3, 25 mcg (1,000 unit) Oral Capsule Take 10 Capsules (10,000 Units total) by mouth    cyanocobalamin, vitamin B-12, 5,000 mcg Sublingual Tablet, Sublingual Place under the tongue    desonide (TRIDESILON) 0.05 % Cream Apply topically    DEXCOM G6 SENSOR Does not apply Device USE AS DIRECTED    DEXCOM G6 TRANSMITTER Does not apply Device USE AS DIRECTED    gabapentin (NEURONTIN) 300 mg Oral Capsule Take 1 Capsule (300 mg total) by mouth    Ibuprofen (MOTRIN) 800 mg Oral Tablet Take 1 Tablet (800 mg total) by mouth Every 8 hours    insulin degludec 100 unit/mL (3 mL) Subcutaneous Insulin Pen ADMINISTER 20 UNITS UNDER THE SKIN DAILY    ketoconazole (NIZORAL) 2 %  Shampoo SHAMPOO TO THE RASH AREA THREE TIMES WEEKLY AS DIRECTED    lidocaine, PF, 1%, 10 mg/mL, Injection Solution 30 mL (300 mg total) by Other route    lisinopriL (PRINIVIL) 2.5 mg Oral Tablet Take 1 Tablet (2.5 mg total) by mouth Once a day    metFORMIN (GLUCOPHAGE XR) 500 mg Oral Tablet Sustained Release 24 hr     nirmatrelvir-ritonavir 300 mg (150 mg x 2)-100 mg Oral Tablets, Dose Pack Take 3 Tablets by mouth Twice daily for 5 days    ocrelizumab (OCREVUS) 30 mg/mL Intravenous Solution Infuse 20 mL (600 mg total) into a venous catheter    OZEMPIC 1 mg/dose (4 mg/3 mL) Subcutaneous Pen Injector INJECT 1MG  AS DIRECTED ONCE A WEEK    OZEMPIC 2 mg/dose (8 mg/3 mL) Subcutaneous Pen Injector INJECT 2 MG UNDER THE SKIN ONCE A WEEK    Pen Needle, Disposable, 31 gauge x 3/16" Needle USE AS DIRECTED ONCE DAILY    semaglutide (OZEMPIC) 1  mg/dose (2 mg/1.5 mL) Subcutaneous Pen Injector INJECT 1 MG UNDER THE SKIN ONCE A WEEK    semaglutide (OZEMPIC) 2 mg/dose (8 mg/3 mL) Subcutaneous Pen Injector Inject 2 mg under the skin    tamsulosin (FLOMAX) 0.4 mg Oral Capsule Take 1 Capsule (0.4 mg total) by mouth    tiZANidine (ZANAFLEX) 4 mg Oral Tablet     traMADoL (ULTRAM) 50 mg Oral Tablet Take 1 Tablet (50 mg total) by mouth    triamcinolone acetonide (KENALOG-40) 40 mg/mL Injection Suspension Inject 1 mL (40 mg total) into the joint     No Known Allergies  No past medical history on file.        Family Medical History:    None         Social History     Socioeconomic History    Marital status: Married     There is no problem list on file for this patient.      PE:   WNWD, not ill appearing, NAD. Mucousa pink and moist. Inferior turbinates erythematous bilaterally. Nares are patent. Pharynx without injection or exudate. Neck supple without adenopathy.  Chest is clear to ausculation with a normal I:E ratio.Normal breathing/respirations. Heart is RRR g. No rashes/lesions     Assessment:  Francisco Jenkins was seen today for  drainage back of throat.    Diagnoses and all orders for this visit:    COVID    Maxillary sinusitis    Uncontrolled diabetes mellitus with hyperglycemia (CMS HCC)    Other orders  -     azithromycin (ZITHROMAX) 250 mg Oral Tablet; Take 500 mg (2 tab) on day 1; take 250 mg (1 tab) on days 2-5.  -     nirmatrelvir-ritonavir 300 mg (150 mg x 2)-100 mg Oral Tablets, Dose Pack; Take 3 Tablets by mouth Twice daily for 5 days             Plan    Positive home covid, they had picture with them      [facility] [place]    Vit d/c, zinc    discussed quarantine precautions, etc    Sugar 209 today ,going to avoid steroids as his sugars have been elevated. He states last A1C around 9      Followup if symptoms worsen, change or persist    Desma Paganini, PA-C

## 2021-05-22 ENCOUNTER — Ambulatory Visit: Payer: BC Managed Care – PPO | Admitting: Pharmacist

## 2021-05-22 DIAGNOSIS — E1169 Type 2 diabetes mellitus with other specified complication: Secondary | ICD-10-CM

## 2021-05-22 DIAGNOSIS — Z794 Long term (current) use of insulin: Secondary | ICD-10-CM

## 2021-05-22 DIAGNOSIS — E785 Hyperlipidemia, unspecified: Secondary | ICD-10-CM

## 2021-05-22 NOTE — Chronic Care Management (AMB) (Signed)
Care Management   Pharmacy Note  05/22/2021 Name: Miguel [name] MRN: [id number] DOB: [birth year]  Subjective: Miguel [name] is a 51 y.o. year old male who is a primary care patient of Olin Hauser, DO. The Care Management team was consulted for assistance with care management and care coordination needs.    Engaged with patient by telephone for follow up visit in response to provider referral for pharmacy case management and/or care coordination services.   The patient was given information about Care Management services today including:  Care Management services includes personalized support from designated clinical staff supervised by the patient's primary care provider, including individualized plan of care and coordination with other care providers. 24/7 contact phone numbers for assistance for urgent and routine care needs. The patient may stop case management services at any time by phone call to the office staff.  Patient agreed to services and consent obtained.  Assessment:  Review of patient status, including review of consultants reports, laboratory and other test data, was performed as part of comprehensive evaluation and provision of chronic care management services.   SDOH (Social Determinants of Health) assessments and interventions performed:    Objective:  Lab Results  Component Value Date   CREATININE 0.72 03/19/2021   CREATININE 0.76 03/15/2020   CREATININE 0.81 03/15/2019    Lab Results  Component Value Date   HGBA1C 9.3 (H) 03/19/2021       Component Value Date/Time   CHOL 158 03/19/2021 0913   TRIG 237 (H) 03/19/2021 0913   HDL 34 (L) 03/19/2021 0913   CHOLHDL 4.6 03/19/2021 0913   VLDL 47 (H) 01/07/2016 0809   LDLCALC 91 03/19/2021 0913     BP Readings from Last 3 Encounters:  03/26/21 126/82  03/25/21 122/71  03/07/21 131/81    Care Plan  No Known Allergies  Medications Reviewed Today     Reviewed by Rennis Petty, RPH-CPP (Pharmacist) on 05/22/21 at 72  Med List Status: <None>   Medication Order Taking? Sig Documenting Provider Last Dose Status Informant  atorvastatin (LIPITOR) 20 MG tablet [id number]  TAKE 1 TABLET(20 MG) BY MOUTH AT BEDTIME Olin Hauser, DO  Active   B-D UF III MINI PEN NEEDLES 31G X 5 MM MISC [id number]  USE ONCE DAILY AS DIRECTED Olin Hauser, DO  Active   Cholecalciferol (D 1000) 1000 units capsule [id number]  Take by mouth.  [provider]  Active            Med Note Barnet Pall, JAMIE A   Thu May 10, 2015  9:44 AM) Received from: [facility]  Continuous Blood Gluc Sensor (DEXCOM G6 SENSOR) [place] [id number]  Use as directed Olin Hauser, DO  Active   Continuous Blood Gluc Transmit (DEXCOM G6 TRANSMITTER) MISC [id number]  Use as directed Olin Hauser, DO  Active   Cyanocobalamin (VITAMIN B-12) 5000 MCG SUBL [id number]  Place under the tongue. [provider]  Active            Med Note Barnet Pall, JAMIE A   Thu May 10, 2015  9:44 AM) Received from: [facility]  desonide (DESOWEN) 0.05 % cream [id number]  Apply topically daily as needed. Karamalegos, Devonne Doughty, DO  Active   gabapentin (NEURONTIN) 300 MG capsule [id number]  Take 300 mg by mouth continuous as needed.  [provider]  Active            Med Note (Barnet Pall, JAMIE  A   Thu May 10, 2015  9:44 AM) Received from: [facility]  ibuprofen (ADVIL,MOTRIN) 800 MG tablet [id number]  Take 800 mg by mouth every 6 (six) hours as needed.   Patient not taking: Reported on 04/03/2021   [provider]  Active            Med Note Barnet Pall, JAMIE A   Thu May 10, 2015  9:44 AM) Received from: [facility]  insulin degludec (TRESIBA FLEXTOUCH) 100 UNIT/ML FlexTouch Pen [id number] Yes Inject 20 Units into the skin daily. Olin Hauser, DO Taking Active   ketoconazole (NIZORAL) 2 % shampoo [id number]  Apply 1 application topically daily as  needed. Olin Hauser, DO  Active   Lancets [facility] ULTRASOFT) lancets [id number]  Check blood sugar three times daily. Olin Hauser, DO  Active   lisinopril (ZESTRIL) 2.5 MG tablet [id number]  TAKE 1 TABLET(2.5 MG) BY MOUTH DAILY Karamalegos, Devonne Doughty, DO  Active   metFORMIN (GLUCOPHAGE-XR) 500 MG 24 hr tablet [id number] Yes Take 1 tablet (500 mg total) by mouth daily with supper. Olin Hauser, DO Taking Active   Multiple Vitamins-Minerals (PRESERVISION AREDS PO) [id number]  Take by mouth. [provider]  Active   Ocrelizumab (OCREVUS IV) [id number]  Inject into the vein. [provider]  Active   Donald Siva test strip [id number]  USE TO CHECK BLOOD SUGAR THREE TIMES DAILY AS DIRECTED [place] Devonne Doughty, DO  Active   OZEMPIC, 2 MG/DOSE, 8 MG/3ML SOPN [id number] Yes Inject 2 mg into the skin once a week. Olin Hauser, DO Taking Active   tamsulosin (FLOMAX) 0.4 MG CAPS capsule [id number]  TAKE ONE CAPSULE BY MOUTH DAILY AS NEEDED FOR KIDNEY STONE FOR 7 TO 14 DAYS  Patient not taking: No sig reported   Olin Hauser, DO  Active   tiZANidine (ZANAFLEX) 4 MG tablet [id number]  TAKE 1/2 TO 1 TABLET(2 TO 4 MG) BY MOUTH EVERY 8 HOURS AS NEEDED FOR MUSCLE SPASMS  Patient not taking: No sig reported   Olin Hauser, DO  Active   traMADol (ULTRAM) 50 MG tablet [id number]  Take 50 mg by mouth daily as needed.  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   Peripheral neuropathic pain 02/28/2015   Groin pain 07/04/2014   Incomplete bladder emptying 07/04/2014   Calculus of kidney 87/68/1157   Renal colic 26/20/3559   OSA on CPAP 12/01/2012   Multiple sclerosis ([place]) 11/09/2012   Exomphalos 11/09/2012    Care Plan : PharmD - Medication Management/T2DM  Updates made by Rennis Petty, RPH-CPP since 05/22/2021 12:00 AM     Problem: Disease Progression      Long-Range Goal: Disease Progression Prevented or Minimized   Start Date: 04/03/2021  Expected End Date: 07/02/2021  This Visit's Progress: On track  Recent Progress: On track  Priority: High  Note:   Current Barriers:  Unable to achieve control  of A1C  A1C 9.3% on 03/19/2021 PA approved for Dexcom G6 Continuous Glucose Monitoring (CGM) - 03/2021  Pharmacist Clinical Goal(s):  patient will achieve control of blood sugar as evidenced by A1C  through collaboration with PharmD and provider.    Interventions: 1:1 collaboration with Olin Hauser, DO regarding development and update of comprehensive plan of care as evidenced by provider attestation and co-signature Inter-disciplinary care team collaboration (see longitudinal plan of care) Perform chart review Office Visit with [place] on 12/29 for drainage, sore throat and sinus pressure. Provider advised patient: Start azithromycin 250 mg - 5 day Z pak course Start Paxlovid 300 mg (150 mg x 2)-100 mg Tablets, Dose Pack; Take 3 Tablets by mouth Twice daily for 5 days Office Visit with [facility] on 12/22 Today patient reports symptoms significantly improved, now only drainage in back of throat remains Confirms completed course of azithromycin and Paxlovid as directed . Confirms held medications as directed by PCP in MyChart message on 12/29 Reports will contact PCP if symptoms do not continue to improve or for new  symptoms  Type 2 Diabetes: Uncontrolled; Current treatment: Ozempic 2 mg weekly on Sundays  Tresiba FlexTouch 20 units daily in evening Metformin ER 500 mg daily with supper (restarted 05/21/2021) Results from past 14-days for Dexcom G6 Continuous Glucose Meter (CGM) from [facility] Clarity system: Target goal range: 70-180    Notes had a sensor error last month (contacted company and sensor replaced) Patient notes and per data from [place] system, blood sugars lower during previous 2 week period (12/8-12/21): Average glucose 182 mg/dL during that time Patient attributes recent elevation in blood sugar to impact of stress on blood sugar (work related/illness) Note patient received steroid injections at [facility] Visit with [facility] on 12/22 Also note patient off of metformin, just restarted yesterday Denies s/s of hypoglycemia Have counseled patient on s/s of low blood sugar and how to treat lows Patient using CGM as tool to reinforce/aid with dietary choices and improve blood sugar control Discuss impact of diet, exercise, weight loss on blood sugar control Discuss importance of having regular well-balanced meals throughout the day and limiting carbohydrate portion sizes Exercise: Reports is limited by pain related to hernia, but active through the day at work, also hunting and fishing Reports Ozempic 2 mg weekly helping with appetite control/weight loss, current home weight: 231 lbs Notes blood sugar control impacted by receiving steroid infusions and injections: Receives steroid injections ~every 3 months for pain management and steroid infusions every 6 months with multiple sclerosis treatment (Ocrelizumab infusion) Reported follows direction from PCP for how can adjust long-acting insulin on days of steroid infusion/injection   Hyperlipidemia: Current treatment: atorvastatin 20 mg daily (held during Paxlovid treatment, but plans to restart today)   Patient  Goals/Self-Care Activities patient will:  - check glucose, document, and provide at future appointments - collaborate with provider on medication access solutions       Plan: Telephone follow up appointment with care management team member scheduled for: 06/19/2021 at 2:30 pm  Wallace Cullens, PharmD, BCACP, CPP Clinical Pharmacist [place] [phone]

## 2021-05-22 NOTE — Patient Instructions (Signed)
Thank you allowing the Care Management Team to be a part of your care! It was a pleasure speaking with you today!     Care Management Team    Noreene Larsson RN, MSN, CCM Nurse Care Coordinator  [phone]   Wallace Cullens, PharmD  Clinical Pharmacist  [phone]   Christa See, MSW, LCSW Clinical Social Worker [phone]   Visit Information   Goals Addressed             This Visit's Progress    Pharmacy Goals       Our goal A1c is less than 7%. This corresponds with fasting sugars less than 130 and 2 hour after meal sugars less than 180. Please check your blood sugar and keep a log of the results  Our goal bad cholesterol, or LDL, is less than 70 . This is why it is important to continue taking your atorvastatin.   Feel free to call me with any questions or concerns. I look forward to our next call!   Wallace Cullens, PharmD, Para [place], CPP Clinical Pharmacist [facility] [phone]        The patient verbalized understanding of instructions, educational materials, and care plan provided today and declined offer to receive copy of patient instructions, educational materials, and care plan.   Telephone follow up appointment with care management team member scheduled for:06/19/2021 at 2:30 pm

## 2021-06-09 ENCOUNTER — Other Ambulatory Visit: Payer: Self-pay | Admitting: Family Medicine

## 2021-06-09 DIAGNOSIS — E1169 Type 2 diabetes mellitus with other specified complication: Secondary | ICD-10-CM

## 2021-06-09 DIAGNOSIS — Z794 Long term (current) use of insulin: Secondary | ICD-10-CM

## 2021-06-09 NOTE — Telephone Encounter (Signed)
Requested Prescriptions  Pending Prescriptions Disp Refills   TRESIBA FLEXTOUCH 100 UNIT/ML FlexTouch Pen [Pharmacy Med Name: TRESIBA FLEXTOUCH PEN ([id number]] 15 mL 0    Sig: ADMINISTER 20 UNITS UNDER THE SKIN DAILY     Endocrinology:  Diabetes - Insulins Failed - 06/09/2021  3:11 AM      Failed - HBA1C is between 0 and 7.9 and within 180 days    Hgb A1c MFr Bld  Date Value Ref Range Status  03/19/2021 9.3 (H) <5.7 % of total Hgb Final    Comment:    For someone without known diabetes, a hemoglobin A1c value of 6.5% or greater indicates that they may have  diabetes and this should be confirmed with a follow-up  test. . For someone with known diabetes, a value <7% indicates  that their diabetes is well controlled and a value  greater than or equal to 7% indicates suboptimal  control. A1c targets should be individualized based on  duration of diabetes, age, comorbid conditions, and  other considerations. . Currently, no consensus exists regarding use of hemoglobin A1c for diagnosis of diabetes for children. Renella Cunas - Valid encounter within last 6 months    Recent Outpatient Visits          2 months ago Annual physical exam   [place], DO   3 months ago History of nephrolithiasis   [place], DO   6 months ago Type 2 diabetes mellitus with other specified complication, with long-term current use of insulin [facility])   [place], DO   10 months ago Type 2 diabetes mellitus with other specified complication, with long-term current use of insulin ([place])   [facility] Olin Hauser, DO   1 year ago Annual physical exam   [place], DO      Future Appointments            In 4 weeks Parks Ranger, Devonne Doughty, [facility], [place]   In 9 months Hollice Espy, [place]

## 2021-06-10 ENCOUNTER — Ambulatory Visit: Payer: BC Managed Care – PPO | Admitting: Pharmacist

## 2021-06-10 DIAGNOSIS — E1169 Type 2 diabetes mellitus with other specified complication: Secondary | ICD-10-CM

## 2021-06-10 NOTE — Patient Instructions (Signed)
Thank you allowing the Care Management Team to be a part of your care! It was a pleasure speaking with you today!     Care Management Team    Noreene Larsson RN, MSN, CCM Nurse Care Coordinator  [phone]   Wallace Cullens, PharmD  Clinical Pharmacist  [phone]   Christa See, MSW, LCSW Clinical Social Worker [phone]   Visit Information   Goals Addressed             This Visit's Progress    Pharmacy Goals       Our goal A1c is less than 7%. This corresponds with fasting sugars less than 130 and 2 hour after meal sugars less than 180. Please check your blood sugar and keep a log of the results  Our goal bad cholesterol, or LDL, is less than 70 . This is why it is important to continue taking your atorvastatin.   Feel free to call me with any questions or concerns. I look forward to our next call!  Wallace Cullens, PharmD, Para March, CPP Clinical Pharmacist [facility] [phone]        The patient verbalized understanding of instructions, educational materials, and care plan provided today and declined offer to receive copy of patient instructions, educational materials, and care plan.   Telephone follow up appointment with care management team member scheduled for: 06/19/2021 at 2:30 PM

## 2021-06-10 NOTE — Chronic Care Management (AMB) (Signed)
Care Management   Pharmacy Note  06/10/2021 Name: KEEFE ZAWISTOWSKI MRN: [id number] DOB: April 16, 1971  Subjective: Miguel [name] is a 51 y.o. year old male who is a primary care patient of Olin Hauser, DO. The Care Management team was consulted for assistance with care management and care coordination needs.    Receive a call from patient requesting a call back for help with obtaining Tyler Aas FlexTouch pen  Engaged with patient by telephone for follow up visit in response to provider referral for pharmacy case management and/or care coordination services.   The patient was given information about Care Management services today including:  Care Management services includes personalized support from designated clinical staff supervised by the patient's primary care provider, including individualized plan of care and coordination with other care providers. 24/7 contact phone numbers for assistance for urgent and routine care needs. The patient may stop case management services at any time by phone call to the office staff.  Patient agreed to services and consent obtained.  Assessment:  Review of patient status, including review of consultants reports, laboratory and other test data, was performed as part of comprehensive evaluation and provision of chronic care management services.   SDOH (Social Determinants of Health) assessments and interventions performed:     Care Plan  No Known Allergies  Medications     Reviewed by Rennis Petty, RPH-CPP (Pharmacist) on 05/22/21 at 1511  Med List Status: <None>   Medication Order Taking? Sig Documenting Provider Last Dose Status Informant  atorvastatin (LIPITOR) 20 MG tablet [id number]  TAKE 1 TABLET(20 MG) BY MOUTH AT BEDTIME Olin Hauser, DO  Active   B-D UF III MINI PEN NEEDLES 31G X 5 MM MISC [id number]  USE ONCE DAILY AS DIRECTED Olin Hauser, DO  Active   Cholecalciferol (D 1000) 1000 units  capsule [id number]  Take by mouth.  [provider]  Active            Med Note Barnet Pall, JAMIE A   Thu May 10, 2015  9:44 AM) Received from: [facility]  Continuous Blood Gluc Sensor (DEXCOM G6 SENSOR) [place] [id number]  Use as directed Olin Hauser, DO  Active   Continuous Blood Gluc Transmit (DEXCOM G6 TRANSMITTER) MISC [id number]  Use as directed Olin Hauser, DO  Active   Cyanocobalamin (VITAMIN B-12) 5000 MCG SUBL [id number]  Place under the tongue. [provider]  Active            Med Note Barnet Pall, JAMIE A   Thu May 10, 2015  9:44 AM) Received from: [facility]  desonide (DESOWEN) 0.05 % cream [id number]  Apply topically daily as needed. Karamalegos, Devonne Doughty, DO  Active   gabapentin (NEURONTIN) 300 MG capsule [id number]  Take 300 mg by mouth continuous as needed.  [provider]  Active            Med Note Barnet Pall, JAMIE A   Thu May 10, 2015  9:44 AM) Received from: [facility]  ibuprofen (ADVIL,MOTRIN) 800 MG tablet [id number]  Take 800 mg by mouth every 6 (six) hours as needed.   Patient not taking: Reported on 04/03/2021   [provider]  Active            Med Note Barnet Pall, JAMIE A   Thu May 10, 2015  9:44 AM) Received from: [facility]  insulin degludec [facility]) 100 UNIT/ML FlexTouch Pen [id number] Yes Inject 20  Units into the skin daily. Olin Hauser, DO Taking Active   ketoconazole (NIZORAL) 2 % shampoo [id number]  Apply 1 application topically daily as needed. Olin Hauser, DO  Active   Lancets [facility] ULTRASOFT) lancets [id number]  Check blood sugar three times daily. Olin Hauser, DO  Active   lisinopril (ZESTRIL) 2.5 MG tablet [id number]  TAKE 1 TABLET(2.5 MG) BY MOUTH DAILY Karamalegos, Devonne Doughty, DO  Active   metFORMIN (GLUCOPHAGE-XR) 500 MG 24 hr tablet [id number] Yes Take 1 tablet (500 mg total) by mouth daily with supper. Olin Hauser, DO  Taking Active   Multiple Vitamins-Minerals (PRESERVISION AREDS PO) [id number]  Take by mouth. [provider]  Active   Ocrelizumab (OCREVUS IV) [id number]  Inject into the vein. [provider]  Active   Donald Siva test strip [id number]  USE TO CHECK BLOOD SUGAR THREE TIMES DAILY AS DIRECTED Parks Ranger Devonne Doughty, DO  Active   OZEMPIC, 2 MG/DOSE, 8 MG/3ML SOPN [id number] Yes Inject 2 mg into the skin once a week. Olin Hauser, DO Taking Active   tamsulosin (FLOMAX) 0.4 MG CAPS capsule [id number]  TAKE ONE CAPSULE BY MOUTH DAILY AS NEEDED FOR KIDNEY STONE FOR 7 TO 14 DAYS  Patient not taking: No sig reported   Olin Hauser, DO  Active   tiZANidine (ZANAFLEX) 4 MG tablet [id number]  TAKE 1/2 TO 1 TABLET(2 TO 4 MG) BY MOUTH EVERY 8 HOURS AS NEEDED FOR MUSCLE SPASMS  Patient not taking: No sig reported   Olin Hauser, DO  Active   traMADol (ULTRAM) 50 MG tablet [id number]  Take 50 mg by mouth daily as needed.  Patient not taking: Reported on 04/03/2021   [provider]  Active              Care Plan : PharmD - Medication Management/T2DM  Updates made by Rennis Petty, RPH-CPP since 06/10/2021 12:00 AM     Problem: Disease Progression      Long-Range Goal: Disease Progression Prevented or Minimized   Start Date: 04/03/2021  Expected End Date: 07/02/2021  Recent Progress: On track  Priority: High  Note:   Current Barriers:  Unable to achieve control of A1C  A1C 9.3% on 03/19/2021 PA approved for Dexcom G6 Continuous Glucose Monitoring (CGM) - 03/2021  Pharmacist Clinical Goal(s):  patient will achieve control of blood sugar as evidenced by A1C  through collaboration with PharmD and provider.    Interventions: 1:1 collaboration with Olin Hauser, DO regarding development and update of comprehensive plan of care as evidenced by provider attestation and co-signature Inter-disciplinary care team  collaboration (see longitudinal plan of care) Receive a call from patient requesting a call back for help with obtaining Tresiba FlexTouch pen  Type 2 Diabetes: Uncontrolled; Current treatment: Ozempic 2 mg weekly on Sundays  Tresiba FlexTouch 20 units daily in evening Metformin ER 500 mg daily with supper Today patient reports he tried to obtain a refill of his Tyler Aas from [employer], but was advised that the pharmacy is currently unable to supply [place]. From review of [place] site, Tyler Aas I covered on patient's health plan formulary for $0 copayment Place call to [facility] on behalf of patient today. Speak with [facility] RPh who reports Tyler Aas needed to be ordered for patient Tyler Aas Rx filled for patient and ready for pick up Follow up with patient to let him know Will follow up with patient regarding T2DM as previously scheduled  Patient Goals/Self-Care Activities patient will:  - check glucose, document, and provide at future appointments - collaborate with provider on medication access solutions      Plan: Telephone follow up appointment with care management team member scheduled for:  06/19/2021 at 2:30 PM  Wallace Cullens, PharmD, [facility] [place], [place] [phone]

## 2021-06-11 ENCOUNTER — Encounter: Payer: Self-pay | Admitting: Family Medicine

## 2021-06-12 ENCOUNTER — Encounter: Payer: Self-pay | Admitting: Family Medicine

## 2021-06-12 ENCOUNTER — Other Ambulatory Visit: Payer: Self-pay

## 2021-06-12 ENCOUNTER — Ambulatory Visit: Payer: BC Managed Care – PPO | Admitting: Family Medicine

## 2021-06-12 VITALS — Wt 249.0 lb

## 2021-06-12 DIAGNOSIS — J0110 Acute frontal sinusitis, unspecified: Secondary | ICD-10-CM

## 2021-06-12 MED ORDER — PREDNISONE 10 MG PO TABS: ORAL_TABLET | ORAL | 0 refills | Status: DC

## 2021-06-12 MED ORDER — IPRATROPIUM BROMIDE 0.06 % NA SOLN: 2.0000 | Freq: Four times a day (QID) | NASAL | 0 refills | Status: DC

## 2021-06-12 MED ORDER — AMOXICILLIN-POT CLAVULANATE 875-125 MG PO TABS: 1.0000 | ORAL_TABLET | Freq: Two times a day (BID) | ORAL | 0 refills | Status: DC

## 2021-06-12 NOTE — Progress Notes (Signed)
Virtual Visit via Telephone The purpose of this virtual visit is to provide medical care while limiting exposure to the novel coronavirus (COVID19) for both patient and office staff.  Consent was obtained for phone visit:  Yes.   Answered questions that patient had about telehealth interaction:  Yes.   I discussed the limitations, risks, security and privacy concerns of performing an evaluation and management service by telephone. I also discussed with the patient that there may be a patient responsible charge related to this service. The patient expressed understanding and agreed to proceed.  Patient Location: Home Provider Location: [facility] (Office)  Participants in virtual visit: - Patient: Miguel [name] - CMA: Orinda Kenner, [place] - Provider: Dr Parks Ranger  ---------------------------------------------------------------------- Chief Complaint  Patient presents with   sinus congestion    Sore Throat    S: Reviewed CMA documentation. I have called patient and gathered additional HPI as follows:  Sinusitis Reports that symptoms started within 1 week, sick contact at home daughter similar symptoms. He had COVID positive about 1 month ago treated resolved, then 2 weeks later has had new issue recurrent sinus symptoms. In past has done well with augmentin. He has upcoming MS infusion therapy. Has done steroids PRN but not always OTC meds not helping Previously on Atrovent  Denies any known or suspected exposure to person with or possibly with COVID19.  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  Borderline but uses CPAP   Peripheral neuropathic pain 41/63/8453   Renal colic 64/68/0321   Shoulder pain 07/23/2015   Sleep apnea    Social History   Tobacco Use   Smoking status: Never   Smokeless tobacco: Never  Substance Use Topics   Alcohol use: No   Drug use: No    Current Outpatient Medications:    amoxicillin-clavulanate (AUGMENTIN) 875-125 MG tablet, Take 1 tablet by mouth 2 (two) times daily., Disp: 20 tablet, Rfl: 0   atorvastatin (LIPITOR) 20 MG tablet, TAKE 1 TABLET(20 MG) BY MOUTH AT BEDTIME, Disp: 90 tablet, Rfl: 3   B-D UF III MINI PEN NEEDLES 31G X 5 MM MISC, USE ONCE DAILY AS DIRECTED, Disp: 100 each, Rfl: 3   Cholecalciferol (D 1000) 1000 units capsule, Take by mouth. , Disp: , Rfl:    Continuous Blood Gluc Sensor (DEXCOM G6 SENSOR) MISC, Use as directed, Disp: 3 each, Rfl: 12   Continuous Blood Gluc Transmit (DEXCOM G6 TRANSMITTER) MISC, Use as directed, Disp: 1 each, Rfl: 3   Cyanocobalamin (VITAMIN B-12) 5000 MCG SUBL, Place under the tongue., Disp: , Rfl:    desonide (DESOWEN) 0.05 % cream, Apply topically daily as needed., Disp: 30 g, Rfl: 2   gabapentin (NEURONTIN) 300 MG capsule, Take 300 mg by mouth continuous as needed. , Disp: , Rfl:    ipratropium (  ATROVENT) 0.06 % nasal spray, Place 2 sprays into both nostrils 4 (four) times daily. For up to 5-7 days then stop., Disp: 15 mL, Rfl: 0   ketoconazole (NIZORAL) 2 % shampoo, Apply 1 application topically daily as needed., Disp: 120 mL, Rfl: 2   Lancets (ONETOUCH ULTRASOFT) lancets, Check blood sugar three times daily., Disp: 100 each, Rfl: 12   lisinopril (ZESTRIL) 2.5 MG tablet, TAKE 1 TABLET(2.5 MG) BY MOUTH DAILY,  Disp: 90 tablet, Rfl: 1   metFORMIN (GLUCOPHAGE-XR) 500 MG 24 hr tablet, Take 1 tablet (500 mg total) by mouth daily with supper., Disp: 90 tablet, Rfl: 0   Multiple Vitamins-Minerals (PRESERVISION AREDS PO), Take by mouth., Disp: , Rfl:    Ocrelizumab (OCREVUS IV), Inject into the vein., Disp: , Rfl:    ONETOUCH ULTRA test strip, USE TO CHECK BLOOD SUGAR THREE TIMES DAILY AS DIRECTED, Disp: 300 strip, Rfl: 3   OZEMPIC, 2 MG/DOSE, 8 MG/3ML SOPN, Inject 2 mg into the skin once a week., Disp: 9 mL, Rfl: 1   predniSONE (DELTASONE) 10 MG tablet, Take 6 tabs with breakfast Day 1, 5 tabs Day 2, 4 tabs Day 3, 3 tabs Day 4, 2 tabs Day 5, 1 tab Day 6., Disp: 21 tablet, Rfl: 0   tamsulosin (FLOMAX) 0.4 MG CAPS capsule, TAKE ONE CAPSULE BY MOUTH DAILY AS NEEDED FOR KIDNEY STONE FOR 7 TO 14 DAYS, Disp: 90 capsule, Rfl: 0   tiZANidine (ZANAFLEX) 4 MG tablet, TAKE 1/2 TO 1 TABLET(2 TO 4 MG) BY MOUTH EVERY 8 HOURS AS NEEDED FOR MUSCLE SPASMS, Disp: 60 tablet, Rfl: 3   traMADol (ULTRAM) 50 MG tablet, Take 50 mg by mouth daily as needed., Disp: , Rfl:    TRESIBA FLEXTOUCH 100 UNIT/ML FlexTouch Pen, ADMINISTER 20 UNITS UNDER THE SKIN DAILY, Disp: 15 mL, Rfl: 0   ibuprofen (ADVIL,MOTRIN) 800 MG tablet, Take 800 mg by mouth every 6 (six) hours as needed.  (Patient not taking: Reported on 04/03/2021), Disp: , Rfl:   Depression screen [facility] 2/9 03/07/2021 03/22/2020 09/20/2019  Decreased Interest 0 0 0  Down, Depressed, Hopeless 0 0 0  PHQ - 2 Score 0 0 0  Altered sleeping 0 - -  Tired, decreased energy 0 - -  Change in appetite 0 - -  Feeling bad or failure about yourself  0 - -  Trouble concentrating 0 - -  Moving slowly or fidgety/restless 0 - -  Suicidal thoughts 0 - -  PHQ-9 Score 0 - -  Difficult doing work/chores Not difficult at all - -    GAD 7 : Generalized Anxiety Score 03/07/2021  Nervous, Anxious, on Edge 0  Control/stop worrying 0  Worry too much - different things 0  Trouble relaxing 0  Restless  0  Easily annoyed or irritable 0  Afraid - awful might happen 0  Total GAD 7 Score 0  Anxiety Difficulty Not difficult at all    -------------------------------------------------------------------------- O: No physical exam performed due to remote telephone encounter.  Lab results reviewed.  Recent Results (from the past 2160 hour(s))  TSH     Status: None   Collection Time: 03/19/21  9:13 AM  Result Value Ref Range   TSH 1.13 0.40 - 4.50 mIU/L  PSA     Status: None   Collection Time: 03/19/21  9:13 AM  Result Value Ref Range   PSA 0.43 < OR = 4.00 ng/mL    Comment: The total PSA value from this assay system is  standardized against the WHO standard. The test  result will be approximately 20% lower when compared  to the equimolar-standardized total PSA (Beckman  Coulter). Comparison of serial PSA results should be  interpreted with this fact in mind. . This test was performed using the Siemens  chemiluminescent method. Values obtained from  different assay methods cannot be used interchangeably. PSA levels, regardless of value, should not be interpreted as absolute evidence of the presence or absence of disease.   Hemoglobin A1c     Status: Abnormal   Collection Time: 03/19/21  9:13 AM  Result Value Ref Range   Hgb A1c MFr Bld 9.3 (H) <5.7 % of total Hgb    Comment: For someone without known diabetes, a hemoglobin A1c value of 6.5% or greater indicates that they may have  diabetes and this should be confirmed with a follow-up  test. . For someone with known diabetes, a value <7% indicates  that their diabetes is well controlled and a value  greater than or equal to 7% indicates suboptimal  control. A1c targets should be individualized based on  duration of diabetes, age, comorbid conditions, and  other considerations. . Currently, no consensus exists regarding use of hemoglobin A1c for diagnosis of diabetes for children. .    Mean Plasma Glucose 220 mg/dL   eAG  (mmol/L) 12.2 mmol/L  CBC with Differential/Platelet     Status: None   Collection Time: 03/19/21  9:13 AM  Result Value Ref Range   WBC 6.1 3.8 - 10.8 Thousand/uL   RBC 5.19 4.20 - 5.80 Million/uL   Hemoglobin 16.7 13.2 - 17.1 g/dL   HCT 48.2 38.5 - 50.0 %   MCV 92.9 80.0 - 100.0 fL   MCH 32.2 27.0 - 33.0 pg   MCHC 34.6 32.0 - 36.0 g/dL   RDW 11.9 11.0 - 15.0 %   Platelets 194 140 - 400 Thousand/uL   MPV 10.7 7.5 - 12.5 fL   Neutro Abs 4,111 1,500 - 7,800 cells/uL   Lymphs Abs 1,336 850 - 3,900 cells/uL   Absolute Monocytes 500 200 - 950 cells/uL   Eosinophils Absolute 122 15 - 500 cells/uL   Basophils Absolute 31 0 - 200 cells/uL   Neutrophils Relative % 67.4 %   Total Lymphocyte 21.9 %   Monocytes Relative 8.2 %   Eosinophils Relative 2.0 %   Basophils Relative 0.5 %  Lipid panel     Status: Abnormal   Collection Time: 03/19/21  9:13 AM  Result Value Ref Range   Cholesterol 158 <200 mg/dL   HDL 34 (L) > OR = 40 mg/dL   Triglycerides 237 (H) <150 mg/dL    Comment: . If a non-fasting specimen was collected, consider repeat triglyceride testing on a fasting specimen if clinically indicated.  Yates Decamp et al. J. of Clin. Lipidol. 7588;3:254-982. Marland Kitchen    LDL Cholesterol (Calc) 91 mg/dL (calc)    Comment: Reference range: <100 . Desirable range <100 mg/dL for primary prevention;   <70 mg/dL for patients with CHD or diabetic patients  with > or = 2 CHD risk factors. Marland Kitchen LDL-C is now calculated using the Martin-Hopkins  calculation, which is a validated novel method providing  better accuracy than the Friedewald equation in the  estimation of LDL-C.  Cresenciano Genre et al. Annamaria Helling. 6415;830(94): 2061-2068  (http://education.[facility].com/faq/FAQ164)    Total CHOL/HDL Ratio 4.6 <5.0 (calc)   Non-HDL Cholesterol (Calc) 124 <130 mg/dL (calc)    Comment: For patients with diabetes plus 1 major  ASCVD risk  factor, treating to a non-HDL-C goal of <100 mg/dL  (LDL-C of <70 mg/dL)  is considered a therapeutic  option.   COMPLETE METABOLIC PANEL WITH GFR     Status: Abnormal   Collection Time: 03/19/21  9:13 AM  Result Value Ref Range   Glucose, Bld 221 (H) 65 - 99 mg/dL    Comment: .            Fasting reference interval . For someone without known diabetes, a glucose value >125 mg/dL indicates that they may have diabetes and this should be confirmed with a follow-up test. .    BUN 16 7 - 25 mg/dL   Creat 0.72 0.60 - 1.29 mg/dL   eGFR 112 > OR = 60 mL/min/1.101m    Comment: The eGFR is based on the CKD-EPI 2021 equation. To calculate  the new eGFR from a previous Creatinine or Cystatin C result, go to https://www.kidney.org/professionals/ kdoqi/gfr%5Fcalculator    BUN/Creatinine Ratio NOT APPLICABLE 6 - 22 (calc)   Sodium 137 135 - 146 mmol/L   Potassium 4.2 3.5 - 5.3 mmol/L   Chloride 101 98 - 110 mmol/L   CO2 24 20 - 32 mmol/L   Calcium 9.6 8.6 - 10.3 mg/dL   Total Protein 6.6 6.1 - 8.1 g/dL   Albumin 4.5 3.6 - 5.1 g/dL   Globulin 2.1 1.9 - 3.7 g/dL (calc)   AG Ratio 2.1 1.0 - 2.5 (calc)   Total Bilirubin 1.0 0.2 - 1.2 mg/dL   Alkaline phosphatase (APISO) 85 36 - 130 U/L   AST 21 10 - 40 U/L   ALT 37 9 - 46 U/L  Urinalysis, Complete     Status: Abnormal   Collection Time: 03/26/21  8:46 AM  Result Value Ref Range   Specific Gravity, UA 1.020 1.005 - 1.030   pH, UA 5.5 5.0 - 7.5   Color, UA Yellow Yellow   Appearance Ur Clear Clear   Leukocytes,UA Negative Negative   Protein,UA Negative Negative/Trace   Glucose, UA 2+ (A) Negative   Ketones, UA Trace (A) Negative   RBC, UA Negative Negative   Bilirubin, UA Negative Negative   Urobilinogen, Ur 0.2 0.2 - 1.0 mg/dL   Nitrite, UA Negative Negative   Microscopic Examination See below:   Microscopic Examination     Status: None   Collection Time: 03/26/21  8:46 AM   Urine  Result Value Ref Range   WBC, UA 0-5 0 - 5 /hpf   RBC None seen 0 - 2 /hpf   Epithelial Cells (non renal) 0-10 0 - 10  /hpf   Bacteria, UA None seen None seen/Few    -------------------------------------------------------------------------- A&P:  Problem List Items Addressed This Visit   None Visit Diagnoses     Acute non-recurrent frontal sinusitis    -  Primary   Relevant Medications   amoxicillin-clavulanate (AUGMENTIN) 875-125 MG tablet   predniSONE (DELTASONE) 10 MG tablet   ipratropium (ATROVENT) 0.06 % nasal spray      Consistent with acute frontal sinusitis, likely initially viral URI vs allergic rhinitis component with worsening concern for bacterial infection.   Plan: 1.  Start Augmentin 875-1257mPO BID x 10 days 2. Order Prednisone taper as back up plan only, hold if can avoid 3. Start Atrovent nasal spray decongestant 2 sprays in each nostril up to 4 times daily for 7 days  Return criteria reviewed    Meds ordered this encounter  Medications   amoxicillin-clavulanate (AUGMENTIN) 875-125  MG tablet    Sig: Take 1 tablet by mouth 2 (two) times daily.    Dispense:  20 tablet    Refill:  0   predniSONE (DELTASONE) 10 MG tablet    Sig: Take 6 tabs with breakfast Day 1, 5 tabs Day 2, 4 tabs Day 3, 3 tabs Day 4, 2 tabs Day 5, 1 tab Day 6.    Dispense:  21 tablet    Refill:  0   ipratropium (ATROVENT) 0.06 % nasal spray    Sig: Place 2 sprays into both nostrils 4 (four) times daily. For up to 5-7 days then stop.    Dispense:  15 mL    Refill:  0    Follow-up: - Return as needed  Patient verbalizes understanding with the above medical recommendations including the limitation of remote medical advice.  Specific follow-up and call-back criteria were given for patient to follow-up or seek medical care more urgently if needed.   - Time spent in direct consultation with patient on phone: 8 minutes   Nobie Putnam, [facility] 06/12/2021, 11:46 AM

## 2021-06-12 NOTE — Patient Instructions (Addendum)
Take Augmentin Only take Prednisone if not improving. Start Atrovent nasal spray decongestant 2 sprays in each nostril up to 4 times daily for 7 days   Please schedule a Follow-up Appointment to: Return if symptoms worsen or fail to improve.  If you have any other questions or concerns, please feel free to call the office or send a message through [place]. You may also schedule an earlier appointment if necessary.  Additionally, you may be receiving a survey about your experience at our office within a few days to 1 week by e-mail or mail. We value your feedback.  Nobie Putnam, DO [place]

## 2021-06-19 ENCOUNTER — Ambulatory Visit: Payer: BC Managed Care – PPO | Admitting: Pharmacist

## 2021-06-19 DIAGNOSIS — E1169 Type 2 diabetes mellitus with other specified complication: Secondary | ICD-10-CM

## 2021-06-19 DIAGNOSIS — Z794 Long term (current) use of insulin: Secondary | ICD-10-CM

## 2021-06-21 NOTE — Chronic Care Management (AMB) (Signed)
Care Management   Pharmacy Note  06/19/2021 Name: Miguel [name] MRN: [id number] DOB: 10/23/1970  Subjective: Miguel [name] is a 51 y.o. year old male who is a primary care patient of Olin Hauser, DO. The Care Management team was consulted for assistance with care management and care coordination needs.    Engaged with patient by telephone for follow up visit in response to provider referral for pharmacy case management and/or care coordination services.   The patient was given information about Care Management services today including:  Care Management services includes personalized support from designated clinical staff supervised by the patient's primary care provider, including individualized plan of care and coordination with other care providers. 24/7 contact phone numbers for assistance for urgent and routine care needs. The patient may stop case management services at any time by phone call to the office staff.  Patient agreed to services and consent obtained.  Assessment:  Review of patient status, including review of consultants reports, laboratory and other test data, was performed as part of comprehensive evaluation and provision of chronic care management services.   SDOH (Social Determinants of Health) assessments and interventions performed:    Objective:  Lab Results  Component Value Date   CREATININE 0.72 03/19/2021   CREATININE 0.76 03/15/2020   CREATININE 0.81 03/15/2019    Lab Results  Component Value Date   HGBA1C 9.3 (H) 03/19/2021     BP Readings from Last 3 Encounters:  03/26/21 126/82  03/25/21 122/71  03/07/21 131/81    Care Plan  No Known Allergies  Medications Reviewed Today     Reviewed by Olin Hauser, DO (Physician) on 06/12/21 at 1145  Med List Status: <None>   Medication Order Taking? Sig Documenting Provider Last Dose Status Informant  atorvastatin (LIPITOR) 20 MG tablet [id number] Yes TAKE 1  TABLET(20 MG) BY MOUTH AT BEDTIME Olin Hauser, DO Taking Active   B-D UF III MINI PEN NEEDLES 31G X 5 MM MISC [id number] Yes USE ONCE DAILY AS DIRECTED Olin Hauser, DO Taking Active   Cholecalciferol (D 1000) 1000 units capsule [id number] Yes Take by mouth.  [provider] Taking Active            Med Note Barnet Pall, JAMIE A   Thu May 10, 2015  9:44 AM) Received from: [facility]  Continuous Blood Gluc Sensor (DEXCOM G6 SENSOR) [place] [id number] Yes Use as directed Olin Hauser, DO Taking Active   Continuous Blood Gluc Transmit (DEXCOM G6 TRANSMITTER) MISC [id number] Yes Use as directed Olin Hauser, DO Taking Active   Cyanocobalamin (VITAMIN B-12) 5000 MCG SUBL [id number] Yes Place under the tongue. [provider] Taking Active            Med Note Barnet Pall, JAMIE A   Thu May 10, 2015  9:44 AM) Received from: [facility]  desonide (DESOWEN) 0.05 % cream [id number] Yes Apply topically daily as needed. Olin Hauser, DO Taking Active   gabapentin (NEURONTIN) 300 MG capsule [id number] Yes Take 300 mg by mouth continuous as needed.  [provider] Taking Active            Med Note Barnet Pall, JAMIE A   Thu May 10, 2015  9:44 AM) Received from: [facility]  ibuprofen (ADVIL,MOTRIN) 800 MG tablet [id number] No Take 800 mg by mouth every 6 (six) hours as needed.   Patient not taking: Reported on 04/03/2021   [provider] Not  Taking Active            Med Note Barnet Pall, JAMIE A   Thu May 10, 2015  9:44 AM) Received from: [facility]  ketoconazole (NIZORAL) 2 % shampoo [id number] Yes Apply 1 application topically daily as needed. Olin Hauser, DO Taking Active   Lancets [facility] ULTRASOFT) lancets [id number] Yes Check blood sugar three times daily. Olin Hauser, DO Taking Active   lisinopril (ZESTRIL) 2.5 MG tablet [id number] Yes TAKE 1 TABLET(2.5 MG) BY MOUTH DAILY Karamalegos,  Devonne Doughty, DO Taking Active   metFORMIN (GLUCOPHAGE-XR) 500 MG 24 hr tablet [id number] Yes Take 1 tablet (500 mg total) by mouth daily with supper. Olin Hauser, DO Taking Active   Multiple Vitamins-Minerals (PRESERVISION AREDS PO) [id number] Yes Take by mouth. [provider] Taking Active   Ocrelizumab (OCREVUS IV) [id number] Yes Inject into the vein. [provider] Taking Active   [facility] ULTRA test strip [id number] Yes USE TO CHECK BLOOD SUGAR THREE TIMES DAILY AS DIRECTED Olin Hauser, DO Taking Active   OZEMPIC, 2 MG/DOSE, 8 MG/3ML SOPN [id number] Yes Inject 2 mg into the skin once a week. Olin Hauser, DO Taking Active   tamsulosin (FLOMAX) 0.4 MG CAPS capsule [id number] Yes TAKE ONE CAPSULE BY MOUTH DAILY AS NEEDED FOR KIDNEY STONE FOR 7 TO 14 DAYS Olin Hauser, DO Taking Active   tiZANidine (ZANAFLEX) 4 MG tablet [id number] Yes TAKE 1/2 TO 1 TABLET(2 TO 4 MG) BY MOUTH EVERY 8 HOURS AS NEEDED FOR MUSCLE SPASMS Karamalegos, Devonne Doughty, DO Taking Active   traMADol (ULTRAM) 50 MG tablet [id number] Yes Take 50 mg by mouth daily as needed. 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   Peripheral neuropathic pain 02/28/2015   Groin pain 07/04/2014   Incomplete bladder emptying 07/04/2014   Calculus of kidney 44/31/5400   Renal colic  86/76/1950   OSA on CPAP 12/01/2012   Multiple sclerosis ([place]) 11/09/2012   Exomphalos 11/09/2012    Care Plan : PharmD - Medication Management/T2DM  Updates made by Rennis Petty, RPH-CPP since 06/21/2021 12:00 AM     Problem: Disease Progression      Long-Range Goal: Disease Progression Prevented or Minimized   Start Date: 04/03/2021  Expected End Date: 07/02/2021  Recent Progress: On track  Priority: High  Note:   Current Barriers:  Unable to achieve control of A1C  A1C 9.3% on 03/19/2021 PA approved for Dexcom G6 Continuous Glucose Monitoring (CGM) - 03/2021 Notes blood sugar control impacted by receiving steroid infusions and injections: Receives steroid injections ~every 3 months for pain management and steroid infusions every 6 months with multiple sclerosis treatment (Ocrelizumab infusion) Reported follows direction from PCP for how can adjust long-acting insulin on days of steroid infusion/injection  Pharmacist Clinical Goal(s):  patient will achieve control of blood sugar as evidenced by A1C  through collaboration with PharmD and provider.    Interventions: 1:1 collaboration with Olin Hauser, DO regarding development and update of comprehensive plan of care as evidenced by provider attestation and co-signature Inter-disciplinary care team collaboration (see longitudinal plan of care) Perform chart review. Note patient seen for Virtual Visit with PCP on 06/12/2021 for sinus congestion/sore throat. Provider advised: Start Augmentin 875-125mg  PO BID x 10 days Order Prednisone taper as back up plan only, hold if can avoid Start Atrovent nasal spray decongestant 2 sprays in each nostril up to 4 times daily for 7 days Today patient reports cough resolved and drainage improved, but mucus remains thick Reports completed prednisone taper and continues taking Augmentin course as directed Counsel to continue to take Augmentin doses with food and to complete full  course Discuss importance of staying well-hydrated and resting States will follow up with PCP if symptoms do not continue to improve   Type 2 Diabetes: Uncontrolled; Current treatment: Ozempic 2 mg weekly on Sundays  Tresiba FlexTouch 20 units daily in evening Metformin ER 500 mg daily with supper Results from past 14-days for Dexcom G6 Continuous Glucose Meter (CGM) from [employer] system: Target goal range: 70-180     Patient attributes recent elevation in blood sugar to recent illness, steroids and lack of activity Patient using CGM as tool to reinforce/aid with dietary choices and improve blood sugar control Discuss impact of diet, exercise, weight loss on blood sugar control Discuss importance of having regular well-balanced meals throughout the day and limiting carbohydrate portion sizes Exercise: Reports has been significantly limited by recent illness, but planning to start physical therapy Has reported Ozempic 2 mg weekly helping with appetite control/weight loss Discuss importance of blood sugar control and medication management options Patient preference to not make adjustment to medications at this time. Prefers to continue to use CGM feedback to aid with improving blood sugar control and to follow up again with CM Pharmacist to discuss again after over illness and past steroid with upcoming Ocrelizumab infusion Schedule follow up appointment accordingly Note next appointment with PCP on 2/20  Patient Goals/Self-Care Activities patient will:  - check glucose, document, and provide at future appointments - collaborate with provider on medication access solutions      Plan: Telephone follow up appointment with care management team member scheduled for:  07/29/2021 at 2:30 PM  Wallace Cullens, PharmD, Para [place], [place] [phone]

## 2021-06-21 NOTE — Patient Instructions (Signed)
Visit Information  Thank you for taking time to visit with me today. Please don't hesitate to contact me if I can be of assistance to you before our next scheduled telephone appointment.  Following are the goals we discussed today:   Goals Addressed             This Visit's Progress    Pharmacy Goals       Our goal A1c is less than 7%. This corresponds with fasting sugars less than 130 and 2 hour after meal sugars less than 180. Please check your blood sugar and keep a log of the results  Our goal bad cholesterol, or LDL, is less than 70 . This is why it is important to continue taking your atorvastatin.  Feel free to call me with any questions or concerns. I look forward to our next call!  Wallace Cullens, PharmD, Para [place], CPP Clinical Pharmacist [facility] [phone]         Our next appointment is by telephone on 07/29/2021 at 2:30 PM  Please call the care guide team at [phone] if you need to cancel or reschedule your appointment.    The patient verbalized understanding of instructions, educational materials, and care plan provided today and declined offer to receive copy of patient instructions, educational materials, and care plan.

## 2021-06-23 ENCOUNTER — Other Ambulatory Visit: Payer: Self-pay | Admitting: Family Medicine

## 2021-06-23 DIAGNOSIS — J0110 Acute frontal sinusitis, unspecified: Secondary | ICD-10-CM

## 2021-06-24 NOTE — Telephone Encounter (Signed)
Requested medication (s) are due for refill today: see note tapered drug  Requested medication (s) are on the active medication list: yes  Last refill:  06/12/21 #72ml 0 refills  Future visit scheduled: yes in 2 weeks  Notes to clinic:  tapered drug. Medication not assigned to a protocol. Do you want to refill Rx?     Requested Prescriptions  Pending Prescriptions Disp Refills   ipratropium (ATROVENT) 0.06 % nasal spray [Pharmacy Med Name: IPRATROPIUM 0.06% NAS SP 15ML (165)] 15 mL 0    Sig: USE 2 SPRAYS IN EACH NOSTRIL FOUR TIMES DAILY FOR UP TO 5 TO 7 DAYS THEN STOP     Off-Protocol Failed - 06/23/2021 11:05 AM      Failed - Medication not assigned to a protocol, review manually.      Passed - Valid encounter within last 12 months    Recent Outpatient Visits           1 week ago Acute non-recurrent frontal sinusitis   [place], DO   3 months ago Annual physical exam   [facility] [place], Devonne Doughty, DO   3 months ago History of nephrolithiasis   [place], DO   7 months ago Type 2 diabetes mellitus with other specified complication, with long-term current use of insulin [facility])   [facility], Devonne Doughty, DO   11 months ago Type 2 diabetes mellitus with other specified complication, with long-term current use of insulin ([place])   [facility] [place], Devonne Doughty, DO       Future Appointments             In 2 weeks [place] Devonne Doughty, DO [facility], [place]   In 8 months Hollice Espy, MD [place]           Off-Protocol Failed - 06/23/2021 11:05 AM      Failed - Medication not assigned to a protocol, review manually.      Passed - Valid encounter within last 12 months    Recent Outpatient Visits           1 week ago Acute non-recurrent frontal sinusitis   [place], DO   3 months ago Annual physical exam   [facility] [place], Devonne Doughty, DO   3 months ago History of nephrolithiasis   [place], DO   7 months ago Type 2 diabetes mellitus with other specified complication, with long-term current use of insulin [facility])   [facility], Devonne Doughty, DO   11 months ago Type 2 diabetes mellitus with other specified complication, with long-term current use of insulin ([place])   [facility] [place], Devonne Doughty, DO       Future Appointments             In 2 weeks [place], Devonne Doughty, DO [facility], [place]   In 8 months Hollice Espy, [place]

## 2021-07-01 ENCOUNTER — Other Ambulatory Visit: Payer: Self-pay | Admitting: Family Medicine

## 2021-07-01 DIAGNOSIS — Z794 Long term (current) use of insulin: Secondary | ICD-10-CM

## 2021-07-01 DIAGNOSIS — E1169 Type 2 diabetes mellitus with other specified complication: Secondary | ICD-10-CM

## 2021-07-02 NOTE — Telephone Encounter (Signed)
Requested medication (s) are due for refill today: yes  Requested medication (s) are on the active medication list: yes  Last refill:  04/03/21 #90 with 0 RF  Future visit scheduled: 07/08/21  Notes to clinic:  failed protocol of HbA1C between 0 - 7.9 within 180 days, B12 within 720 days,please assess.  Requested Prescriptions  Pending Prescriptions Disp Refills   metFORMIN (GLUCOPHAGE-XR) 500 MG 24 hr tablet [Pharmacy Med Name: METFORMIN ER 500MG 24HR TABS] 90 tablet 0    Sig: TAKE 1 TABLET(500 MG) BY MOUTH DAILY WITH SUPPER     Endocrinology:  Diabetes - Biguanides Failed - 07/01/2021  3:10 AM      Failed - HBA1C is between 0 and 7.9 and within 180 days    Hgb A1c MFr Bld  Date Value Ref Range Status  03/19/2021 9.3 (H) <5.7 % of total Hgb Final    Comment:    For someone without known diabetes, a hemoglobin A1c value of 6.5% or greater indicates that they may have  diabetes and this should be confirmed with a follow-up  test. . For someone with known diabetes, a value <7% indicates  that their diabetes is well controlled and a value  greater than or equal to 7% indicates suboptimal  control. A1c targets should be individualized based on  duration of diabetes, age, comorbid conditions, and  other considerations. . Currently, no consensus exists regarding use of hemoglobin A1c for diagnosis of diabetes for children. .           Failed - B12 Level in normal range and within 720 days    No results found for: VITAMINB12        Passed - Cr in normal range and within 360 days    Creat  Date Value Ref Range Status  03/19/2021 0.72 0.60 - 1.29 mg/dL Final          Passed - eGFR in normal range and within 360 days    GFR, Est African American  Date Value Ref Range Status  03/15/2020 125 > OR = 60 mL/min/1.67m Final   GFR, Est Non African American  Date Value Ref Range Status  03/15/2020 108 > OR = 60 mL/min/1.741mFinal   eGFR  Date Value Ref Range Status   03/19/2021 112 > OR = 60 mL/min/1.7348minal    Comment:    The eGFR is based on the CKD-EPI 2021 equation. To calculate  the new eGFR from a previous Creatinine or Cystatin C result, go to https://www.kidney.org/professionals/ kdoqi/gfr%5Fcalculator           Passed - Valid encounter within last 6 months    Recent Outpatient Visits           2 weeks ago Acute non-recurrent frontal [facility]itis   [facility]   3 months ago Annual physical exam   [facility]   3 months ago History of nephrolithiasis   [place]   7 months ago Type 2 diabetes mellitus with other specified complication, with long-term current use of insulin [facility]   11 months ago Type 2 diabetes mellitus with other specified complication, with long-term current use of insulin ([facility]       Future Appointments  In 6 days [place], Devonne Doughty, DO [facility], [place]   In 8 months Hollice Espy, MD [facility]            Passed - CBC within normal limits and completed in the last 12 months    WBC  Date Value Ref Range Status  03/19/2021 6.1 3.8 - 10.8 Thousand/uL Final   RBC  Date Value Ref Range Status  03/19/2021 5.19 4.20 - 5.80 Million/uL Final   Hemoglobin  Date Value Ref Range Status  03/19/2021 16.7 13.2 - 17.1 g/dL Final   HCT  Date Value Ref Range Status  03/19/2021 48.2 38.5 - 50.0 % Final   MCHC  Date Value Ref Range Status  03/19/2021 34.6 32.0 - 36.0 g/dL Final   [facility]  Date Value Ref Range Status  03/19/2021 32.2 27.0 - 33.0 pg Final   MCV  Date Value Ref Range Status  03/19/2021 92.9 80.0 - 100.0 fL Final   No results found for: PLTCOUNTKUC, LABPLAT, POCPLA RDW  Date Value Ref  Range Status  03/19/2021 11.9 11.0 - 15.0 % Final

## 2021-07-08 ENCOUNTER — Encounter: Payer: Self-pay | Admitting: Family Medicine

## 2021-07-08 ENCOUNTER — Other Ambulatory Visit: Payer: Self-pay

## 2021-07-08 ENCOUNTER — Ambulatory Visit: Payer: BC Managed Care – PPO | Admitting: Family Medicine

## 2021-07-08 VITALS — BP 115/80 | HR 88 | Ht 68.0 in | Wt 238.2 lb

## 2021-07-08 DIAGNOSIS — G35 Multiple sclerosis: Secondary | ICD-10-CM | POA: Diagnosis not present

## 2021-07-08 DIAGNOSIS — Z9989 Dependence on other enabling machines and devices: Secondary | ICD-10-CM

## 2021-07-08 DIAGNOSIS — Z794 Long term (current) use of insulin: Secondary | ICD-10-CM

## 2021-07-08 DIAGNOSIS — G4733 Obstructive sleep apnea (adult) (pediatric): Secondary | ICD-10-CM

## 2021-07-08 DIAGNOSIS — E1169 Type 2 diabetes mellitus with other specified complication: Secondary | ICD-10-CM

## 2021-07-08 LAB — POCT GLYCOSYLATED HEMOGLOBIN (HGB A1C): Hemoglobin A1C: 8.6 % — AB (ref 4.0–5.6)

## 2021-07-08 NOTE — Assessment & Plan Note (Signed)
Stable without recent exacerbation Followed by [facility] Continue current therapy as directed Has had steroid course Improved on Tizanidine - refills 

## 2021-07-08 NOTE — Assessment & Plan Note (Signed)
Well controlled, chronic OSA on CPAP - Good adherence to CPAP nightly - Continue current CPAP therapy, patient seems to be benefiting from therapy  

## 2021-07-08 NOTE — Patient Instructions (Addendum)
Thank you for coming to the office today.  Recent Labs    11/21/20 0812 03/19/21 0913 07/08/21 0819  HGBA1C 9.2* 9.3* 8.6*   Keep on current regimen Ozempic 2mg  weekly  Keep on insulin 20 units daily, may slightly adjust if needed.   Please schedule a Follow-up Appointment to: Return in about 4 months (around 11/05/2021) for 4 month follow-up DM A1c.  If you have any other questions or concerns, please feel free to call the office or send a message through [place]. You may also schedule an earlier appointment if necessary.  Additionally, you may be receiving a survey about your experience at our office within a few days to 1 week by e-mail or mail. We value your feedback.  Nobie Putnam, DO [place]

## 2021-07-08 NOTE — Assessment & Plan Note (Signed)
A1c down to 8.6, improved Still has elevated CBGs based on Dexcom CGM, seems inconsistent by report Still has had steroid courses, recently for illness, and MS flare, bag steroid infusions No evidence of hypoglycemia on GLP and Insulin Failed Metformin 1000mg  BID (GI intolerance), XR was ineffective Complications - other including dyslipidemia with low HDL and history of hypertriglyceridemia, obesity, OSA - increases risk of future cardiovascular complications / poor glucose control due to OSA  Plan:  1. Continue Ozempic 2mg  weekly inj 2. Tresiba 20 unit 3. Encourage improved lifestyle - low carb, low sugar diet, reduce portion size, continue improving regular exercise 4. Check CBG, bring log to next visit for review again 5. Continue Statin, ACE

## 2021-07-08 NOTE — Progress Notes (Signed)
Subjective:    Patient ID: Miguel [name], male    DOB: 12/10/1970, 51 y.o.   MRN: [id number]  Miguel [name] is a 51 y.o. male presenting on 07/08/2021 for Diabetes   HPI  FOLLOW-UP CHRONIC DM, Type 2: See prior notes for background information.  Recent results prior A1c 9.2 to 9.3 since 11/2020  Today due for A1c DexCom CGM - CBG log: avg 170-240s, rare high 270-300 Meds- - Ozempic 2mg  [place] weekly - admits some constipation related - Tresiba at 20u unit daily - He tried Metformin 500mg  twice a day without relief Currently on ACEi Lifestyle: Weight down 11 lbs in 1 month - Diet (admits recently some poor diet lately and may be affecting his sugar) - Exercise - back in gym at therapy 2 x week - Last DM Eye 08/2019 Denies hypoglycemia   Chronic Neuropathic Pain / Back Pain / Multiple Sclerosis Followed by [facility] Neurology. See outside record for details. Continues on disease modifying therapy infusion every few months, rarely has flare up requiring bag steroid infusion. - Taking Gabapentin, Tizanidine, Tramadol - temporary relief - Has been receiving spinal injections as well - Significant improvement still on Tizanidine 2-4mg  dosage PRN with great results to help pain. Takes it early to prevent. Has rarely taken Baclofen PRN for spasms.    OSA on CPAP  - Patient reports prior history of dx OSA and on CPAP for years, prior to treatment initial symptoms were snoring, daytime sleepiness and fatigue, has had several sleep studies in the past. - Today reports that sleep apnea is well controlled. He uses the CPAP machine every night. Tolerates the machine well, and thinks that sleeps better with it and feels good. No new concerns or symptoms.    Depression screen [facility] 2/9 07/08/2021 03/07/2021 03/22/2020  Decreased Interest 0 0 0  Down, Depressed, Hopeless 0 0 0  PHQ - 2 Score 0 0 0  Altered sleeping 0 0 -  Tired, decreased energy 0 0 -  Change in appetite 0 0 -  Feeling  bad or failure about yourself  0 0 -  Trouble concentrating 0 0 -  Moving slowly or fidgety/restless 0 0 -  Suicidal thoughts 0 0 -  PHQ-9 Score 0 0 -  Difficult doing work/chores Not difficult at all Not difficult at all -    Social History   Tobacco Use   Smoking status: Never   Smokeless tobacco: Never  Substance Use Topics   Alcohol use: No   Drug use: No    Review of Systems Per HPI unless specifically indicated above     Objective:    BP 115/80    Pulse 88    Ht 5\' 8"  (1.727 m)    Wt 238 lb 3.2 oz (108 kg)    SpO2 99%    BMI 36.22 kg/m   Wt Readings from Last 3 Encounters:  07/08/21 238 lb 3.2 oz (108 kg)  06/12/21 249 lb (112.9 kg)  03/26/21 249 lb (112.9 kg)    Physical Exam Vitals and nursing note reviewed.  Constitutional:      General: He is not in acute distress.    Appearance: He is well-developed. He is not diaphoretic.     Comments: Well-appearing, comfortable, cooperative  HENT:     Head: Normocephalic and atraumatic.  Eyes:     General:        Right eye: No discharge.        Left  eye: No discharge.     Conjunctiva/sclera: Conjunctivae normal.  Neck:     Thyroid: No thyromegaly.  Cardiovascular:     Rate and Rhythm: Normal rate and regular rhythm.     Pulses: Normal pulses.     Heart sounds: Normal heart sounds. No murmur heard. Pulmonary:     Effort: Pulmonary effort is normal. No respiratory distress.     Breath sounds: Normal breath sounds. No wheezing or rales.  Musculoskeletal:        General: Normal range of motion.     Cervical back: Normal range of motion and neck supple.  Lymphadenopathy:     Cervical: No cervical adenopathy.  Skin:    General: Skin is warm and dry.     Findings: No erythema or rash.  Neurological:     Mental Status: He is alert and oriented to person, place, and time. Mental status is at baseline.  Psychiatric:        Behavior: Behavior normal.     Comments: Well groomed, good eye contact, normal speech and  thoughts     Results for orders placed or performed in visit on 07/08/21  POCT HgB A1C  Result Value Ref Range   Hemoglobin A1C 8.6 (A) 4.0 - 5.6 %      Assessment & Plan:   Problem List Items Addressed This Visit     Type 2 diabetes mellitus with other specified complication ([place]) - Primary    A1c down to 8.6, improved Still has elevated CBGs based on Dexcom CGM, seems inconsistent by report Still has had steroid courses, recently for illness, and MS flare, bag steroid infusions No evidence of hypoglycemia on GLP and Insulin Failed Metformin 1000mg  BID (GI intolerance), XR was ineffective Complications - other including dyslipidemia with low HDL and history of hypertriglyceridemia, obesity, OSA - increases risk of future cardiovascular complications / poor glucose control due to OSA  Plan:  1. Continue Ozempic 2mg  weekly inj 2. Tresiba 20 unit 3. Encourage improved lifestyle - low carb, low sugar diet, reduce portion size, continue improving regular exercise 4. Check CBG, bring log to next visit for review again 5. Continue Statin, ACE      Relevant Orders   POCT HgB A1C (Completed)   OSA on CPAP    Well controlled, chronic OSA on CPAP - Good adherence to CPAP nightly - Continue current CPAP therapy, patient seems to be benefiting from therapy       Multiple sclerosis ([place])    Stable without recent exacerbation Followed by [facility] Neurology Continue current therapy as directed Has had steroid course Improved on Tizanidine - refills      Other Visit Diagnoses     Morbid obesity ([place])           BMI >36, with comorbid conditions DM, HLD, MS Encourage lifestyle management On GLP1 Wt loss 10 lbs in 1 month  No orders of the defined types were placed in this encounter.   Follow up plan: Return in about 4 months (around 11/05/2021) for 4 month follow-up DM A1c.   Nobie Putnam, [facility] 07/08/2021,  8:19 AM

## 2021-07-29 ENCOUNTER — Ambulatory Visit: Payer: BC Managed Care – PPO | Admitting: Pharmacist

## 2021-07-29 DIAGNOSIS — Z794 Long term (current) use of insulin: Secondary | ICD-10-CM

## 2021-07-29 DIAGNOSIS — E1169 Type 2 diabetes mellitus with other specified complication: Secondary | ICD-10-CM

## 2021-07-29 MED ORDER — EMPAGLIFLOZIN 10 MG PO TABS: 10.0000 mg | ORAL_TABLET | Freq: Every day | ORAL | 2 refills | Status: DC

## 2021-07-29 NOTE — Addendum Note (Signed)
Addended by: Wallace Cullens A on: 07/29/2021 02:30 PM ? ? Modules accepted: Orders ? ?

## 2021-07-29 NOTE — Patient Instructions (Signed)
Thank you allowing the Care Management Team to be a part of your care! It was a pleasure speaking with you today! ?  ?  ?Care Management Team  ?  ?Noreene Larsson RN, MSN, CCM ?Nurse Care Coordinator  ?[phone] ?  ?Wallace Cullens, PharmD  ?Clinical Pharmacist  ?[phone] ?  ?Christa See, MSW, LCSW ?Clinical Social Worker ?[phone] ? ? ?Visit Information ? ? Goals Addressed   ? ?  ?  ?  ?  ? This Visit's Progress  ?  Pharmacy Goals     ?  Start Jardiance 10 mg daily. Take this medication in the morning, as it can cause more frequent urination with more sugar in the urine. It may worsen risk for dehydration or genital infections. Focus on staying well hydrated and using good genital hygiene. Stop the medication and call our office if you develop any symptoms of genital infections, such as burning, itching, or pain while urinating or itching with redness that could be a yeast infection. If you have a day that you are vomiting or having diarrhea and you are very dehydrated, please hold this medication until you feel better.  ? ? ?Our goal A1c is less than 7%. This corresponds with fasting sugars less than 130 and 2 hour after meal sugars less than 180. Please check your blood sugar and keep a log of the results ? ?Our goal bad cholesterol, or LDL, is less than 70 . This is why it is important to continue taking your atorvastatin. ? ?Feel free to call me with any questions or concerns. I look forward to our next call! ? ? ?Wallace Cullens, PharmD, BCACP, CPP ?Clinical Pharmacist ?[facility] ?[place] ?[phone] ?  ? ?  ? ? ?Patient verbalizes understanding of instructions and care plan provided today and agrees to view in [place]. Active MyChart status confirmed with patient.   ? ?Telephone follow up appointment with care management team member scheduled for: 09/02/2021 at 10:00 AM ?

## 2021-07-29 NOTE — Chronic Care Management (AMB) (Signed)
Care Management   Pharmacy Note  07/29/2021 Name: Miguel [name] MRN: [id number] DOB: 08-12-1970  Subjective: Miguel [name] is a 51 y.o. year old male who is a primary care patient of Olin Hauser, DO. The Care Management team was consulted for assistance with care management and care coordination needs.    Engaged with patient by telephone for follow up visit in response to provider referral for pharmacy case management and/or care coordination services.   The patient was given information about Care Management services today including:  Care Management services includes personalized support from designated clinical staff supervised by the patient's primary care provider, including individualized plan of care and coordination with other care providers. 24/7 contact phone numbers for assistance for urgent and routine care needs. The patient may stop case management services at any time by phone call to the office staff.  Patient agreed to services and consent obtained.  Assessment:  Review of patient status, including review of consultants reports, laboratory and other test data, was performed as part of comprehensive evaluation and provision of chronic care management services.   SDOH (Social Determinants of Health) assessments and interventions performed:    Objective:  Lab Results  Component Value Date   CREATININE 0.72 03/19/2021   CREATININE 0.76 03/15/2020   CREATININE 0.81 03/15/2019    Lab Results  Component Value Date   HGBA1C 8.6 (A) 07/08/2021       Component Value Date/Time   CHOL 158 03/19/2021 0913   TRIG 237 (H) 03/19/2021 0913   HDL 34 (L) 03/19/2021 0913   CHOLHDL 4.6 03/19/2021 0913   VLDL 47 (H) 01/07/2016 0809   LDLCALC 91 03/19/2021 0913     BP Readings from Last 3 Encounters:  07/08/21 115/80  03/26/21 126/82  03/25/21 122/71   Pulse Readings from Last 3 Encounters:  07/08/21 88  03/26/21 94  03/25/21 83     Care  Plan  No Known Allergies  Medications Reviewed Today     Reviewed by Olin Hauser, DO (Physician) on 07/08/21 at [place] List Status: <None>   Medication Order Taking? Sig Documenting Provider Last Dose Status Informant  amoxicillin-clavulanate (AUGMENTIN) 875-125 MG tablet [id number] Yes Take 1 tablet by mouth 2 (two) times daily. Olin Hauser, DO Taking Active   atorvastatin (LIPITOR) 20 MG tablet [id number] Yes TAKE 1 TABLET(20 MG) BY MOUTH AT BEDTIME Olin Hauser, DO Taking Active   B-D UF III MINI PEN NEEDLES 31G X 5 MM MISC [id number] Yes USE ONCE DAILY AS DIRECTED Olin Hauser, DO Taking Active   Cholecalciferol (D 1000) 1000 units capsule [id number] Yes Take by mouth.  [provider] Taking Active            Med Note Barnet Pall, JAMIE A   Thu May 10, 2015  9:44 AM) Received from: [facility]  Continuous Blood Gluc Sensor (DEXCOM G6 SENSOR) [place] [id number] Yes Use as directed Olin Hauser, DO Taking Active   Continuous Blood Gluc Transmit (DEXCOM G6 TRANSMITTER) MISC [id number] Yes Use as directed Olin Hauser, DO Taking Active   Cyanocobalamin (VITAMIN B-12) 5000 MCG SUBL [id number] Yes Place under the tongue. [provider] Taking Active            Med Note Barnet Pall, JAMIE A   Thu May 10, 2015  9:44 AM) Received from: [facility]  desonide (DESOWEN) 0.05 % cream [id number] Yes Apply topically daily as needed. Nobie Putnam  J, DO Taking Active   gabapentin (NEURONTIN) 300 MG capsule [id number] Yes Take 300 mg by mouth continuous as needed.  [provider] Taking Active            Med Note Barnet Pall, JAMIE A   Thu May 10, 2015  9:44 AM) Received from: [facility]  ibuprofen (ADVIL,MOTRIN) 800 MG tablet [id number] No Take 800 mg by mouth every 6 (six) hours as needed.   Patient not taking: Reported on 04/03/2021   [provider] Not Taking Active            Med Note  Barnet Pall, JAMIE A   Thu May 10, 2015  9:44 AM) Received from: [facility]  ipratropium (ATROVENT) 0.06 % nasal spray [id number] Yes USE 2 SPRAYS IN EACH NOSTRIL FOUR TIMES DAILY FOR UP TO 5 TO 7 DAYS THEN STOP Karamalegos, Devonne Doughty, DO Taking Active   ketoconazole (NIZORAL) 2 % shampoo [id number] Yes Apply 1 application topically daily as needed. Olin Hauser, DO Taking Active   Lancets [facility] ULTRASOFT) lancets [id number] Yes Check blood sugar three times daily. Olin Hauser, DO Taking Active   lisinopril (ZESTRIL) 2.5 MG tablet [id number] Yes TAKE 1 TABLET(2.5 MG) BY MOUTH DAILY [place], Devonne Doughty, DO Taking Active   metFORMIN (GLUCOPHAGE-XR) 500 MG 24 hr tablet [id number] Yes TAKE 1 TABLET(500 MG) BY MOUTH DAILY WITH SUPPER Olin Hauser, DO Taking Active   Multiple Vitamins-Minerals (PRESERVISION AREDS PO) [id number] Yes Take by mouth. [provider] Taking Active   Ocrelizumab (OCREVUS IV) [id number] Yes Inject into the vein. [provider] Taking Active   [facility] ULTRA test strip [id number] Yes USE TO CHECK BLOOD SUGAR THREE TIMES DAILY AS DIRECTED Olin Hauser, DO Taking Active   OZEMPIC, 2 MG/DOSE, 8 MG/3ML SOPN [id number] Yes Inject 2 mg into the skin once a week. Olin Hauser, DO Taking Active   predniSONE (DELTASONE) 10 MG tablet [id number] Yes Take 6 tabs with breakfast Day 1, 5 tabs Day 2, 4 tabs Day 3, 3 tabs Day 4, 2 tabs Day 5, 1 tab Day 6. Olin Hauser, DO Taking Active   tamsulosin (FLOMAX) 0.4 MG CAPS capsule [id number] Yes TAKE ONE CAPSULE BY MOUTH DAILY AS NEEDED FOR KIDNEY STONE FOR 7 TO 14 DAYS Olin Hauser, DO Taking Active   tiZANidine (ZANAFLEX) 4 MG tablet [id number] Yes TAKE 1/2 TO 1 TABLET(2 TO 4 MG) BY MOUTH EVERY 8 HOURS AS NEEDED FOR MUSCLE SPASMS Karamalegos, Devonne Doughty, DO Taking Active   traMADol (ULTRAM) 50 MG tablet [id number] Yes Take 50 mg by mouth daily as  needed. [provider] Taking Active   TRESIBA FLEXTOUCH 100 UNIT/ML FlexTouch Pen [id number] Yes ADMINISTER 20 UNITS UNDER THE SKIN DAILY Olin Hauser, DO Taking Active              Care Plan : PharmD - Medication Management/T2DM  Updates made by Rennis Petty, RPH-CPP since 07/29/2021 12:00 AM     Problem: Disease Progression      Long-Range Goal: Disease Progression Prevented or Minimized   Start Date: 04/03/2021  Expected End Date: 07/02/2021  Recent Progress: On track  Priority: High  Note:   Current Barriers:  Unable to achieve control of A1C  PA approved for Dexcom G6 Continuous Glucose Monitoring (CGM) - 03/2021 Notes blood sugar control impacted by receiving steroid infusions and injections: Receives steroid injections ~every 3 months for pain management and steroid infusions every 6  months with multiple sclerosis treatment (Ocrelizumab infusion) Reported follows direction from PCP for how can adjust long-acting insulin on days of steroid infusion/injection  Pharmacist Clinical Goal(s):  patient will achieve control of blood sugar as evidenced by A1C  through collaboration with PharmD and provider.    Interventions: 1:1 collaboration with Olin Hauser, DO regarding development and update of comprehensive plan of care as evidenced by provider attestation and co-signature Inter-disciplinary care team collaboration (see longitudinal plan of care) Perform chart review. Note patient seen for Office Visit with PCP on 2/20 A1C 8.6%   Type 2 Diabetes: Improved based on latest A1C; current treatment: Ozempic 2 mg weekly on Sundays  Tresiba FlexTouch 20 units daily in evening Metformin ER 500 mg twice daily with meals Results from past 14-days for Dexcom G6 Continuous Glucose Meter (CGM) from [facility] Clarity system: Target goal range: 70-180     Patient using CGM as tool to reinforce/aid with dietary choices and improve blood sugar  control Reports expects next pain/steroid injection on 3/24 Discuss impact of diet, exercise, weight loss on blood sugar control Reports has recently lost ~10 lbs Reports having regular well-balanced meals throughout the day and limiting carbohydrate portion sizes Exercise: Reports currently going to physical therapy twice/week Has reported Ozempic 2 mg weekly helping with appetite control/weight loss Statin therapy: atorvastatin 20 mg daily Discuss importance of blood sugar control and medication management options Patient has noticed headaches with taking metformin ER Patient interested in addition of SGLT2-inhibitor for tighter blood sugar control and possible reduction of insulin dose in future Collaborate with PCP regarding the addition of SGLT2-inhibitor Advise patient to STOP metformin ER and START Jardiance 10 mg daily with breakfast. Patient to continue Ozempic 2 mg weekly and Tresiba FlexTouch 20 units daily Counseled on SGLT2, including mechanism of action, side effects, and benefits. Discussed potential side effects of dehydration, genitourinary infections. Encouraged adequate hydration and genital hygiene. Advised on sick day rules (if a day with significantly reduced oral intake, serious vomiting, or diarrhea, hold SGLT2). Patient verbalized understanding.  CPP sends Rx for Jardiance 10 mg daily to pharmacy for patient Will plan to check BMET in 3 months Counsel patient on s/s of hypoglycemia. Encourage patient to monitor blood sugar closely with start of Jardiance. Encourage patient to carry quick-acting sources of sugar in case of symptoms of hypoglycemia   Patient Goals/Self-Care Activities patient will:  - check glucose, document, and provide at future appointments - collaborate with provider on medication access solutions  - attend medical appointments as scheduled  Next appointment with PCP on 6/22      Plan: Telephone follow up appointment with care management team  member scheduled for:  09/02/2021 at 10:00 AM  Wallace Cullens, PharmD, [place], [place] [phone]

## 2021-08-14 ENCOUNTER — Other Ambulatory Visit: Payer: Self-pay | Admitting: Family Medicine

## 2021-08-14 DIAGNOSIS — Z794 Long term (current) use of insulin: Secondary | ICD-10-CM

## 2021-08-15 NOTE — Telephone Encounter (Signed)
Requested Prescriptions  ?Pending Prescriptions Disp Refills  ?? TRESIBA FLEXTOUCH 100 UNIT/ML FlexTouch Pen [Pharmacy Med Name: TRESIBA FLEXTOUCH PEN ([id number])[id number]] 15 mL 0  ?  Sig: ADMINISTER 20 UNITS UNDER THE SKIN DAILY  ?  ? Endocrinology:  Diabetes - Insulins Failed - 08/14/2021  3:11 AM  ?  ?  Failed - HBA1C is between 0 and 7.9 and within 180 days  ?  Hemoglobin A1C  ?Date Value Ref Range Status  ?07/08/2021 8.6 (A) 4.0 - 5.6 % Final  ? ?Hgb A1c MFr Bld  ?Date Value Ref Range Status  ?03/19/2021 9.3 (H) <5.7 % of total Hgb Final  ?  Comment:  ?  For someone without known diabetes, a hemoglobin A1c ?value of 6.5% or greater indicates that they may have  ?diabetes and this should be confirmed with a follow-up  ?test. ?. ?For someone with known diabetes, a value <7% indicates  ?that their diabetes is well controlled and a value  ?greater than or equal to 7% indicates suboptimal  ?control. A1c targets should be individualized based on  ?duration of diabetes, age, comorbid conditions, and  ?other considerations. ?. ?Currently, no consensus exists regarding use of ?hemoglobin A1c for diagnosis of diabetes for children. ?. ?  ?   ?  ?  Passed - Valid encounter within last 6 months  ?  Recent Outpatient Visits   ?      ? 1 month ago Type 2 diabetes mellitus with other specified complication, with long-term current use of insulin ([place])  ? [place], DO  ? 2 months ago Acute non-recurrent frontal sinusitis  ? [place], DO  ? 4 months ago Annual physical exam  ? [place], DO  ? 5 months ago History of nephrolithiasis  ? [place], DO  ? 8 months ago Type 2 diabetes mellitus with other specified complication, with long-term current use of insulin ([place])  ? [place], DO  ?  ?  ?Future Appointments   ?         ? In 2 months Parks Ranger, [facility], [place]  ? In 7 months Hollice Espy, MD [place]  ?  ? ?  ?  ?  ? ?

## 2021-09-02 ENCOUNTER — Ambulatory Visit: Payer: BC Managed Care – PPO | Admitting: Pharmacist

## 2021-09-02 DIAGNOSIS — Z794 Long term (current) use of insulin: Secondary | ICD-10-CM

## 2021-09-02 NOTE — Chronic Care Management (AMB) (Signed)
?Care Management  ? ?Pharmacy Note ? ?09/02/2021 ?Name: Miguel [name] MRN: [id number] DOB: 1970/06/24 ? ?Subjective: ?Miguel [name] is a 51 y.o. year old male who is a primary care patient of Olin Hauser, DO. The Care Management team was consulted for assistance with care management and care coordination needs.   ? ?Engaged with patient by telephone for follow up visit in response to provider referral for pharmacy case management and/or care coordination services.  ? ?The patient was given information about Care Management services today including:  ?Care Management services includes personalized support from designated clinical staff supervised by the patient's primary care provider, including individualized plan of care and coordination with other care providers. ?24/7 contact phone numbers for assistance for urgent and routine care needs. ?The patient may stop case management services at any time by phone call to the office staff. ? ?Patient agreed to services and consent obtained. ? ?Assessment:  Review of patient status, including review of consultants reports, laboratory and other test data, was performed as part of comprehensive evaluation and provision of chronic care management services.  ? ?SDOH (Social Determinants of Health) assessments and interventions performed:   ? ?Objective: ? ?Lab Results  ?Component Value Date  ? CREATININE 0.72 03/19/2021  ? CREATININE 0.76 03/15/2020  ? CREATININE 0.81 03/15/2019  ? ? ?Lab Results  ?Component Value Date  ? HGBA1C 8.6 (A) 07/08/2021  ? ? ?BP Readings from Last 3 Encounters:  ?07/08/21 115/80  ?03/26/21 126/82  ?03/25/21 122/71  ? ? ?Care Plan ? ?No Known Allergies ? ?Medications Reviewed Today   ? ? Reviewed by Rennis Petty, RPH-CPP (Pharmacist) on 09/02/21 at 1512  Med List Status: <None>  ? ?Medication Order Taking? Sig Documenting Provider Last Dose Status Informant  ?atorvastatin (LIPITOR) 20 MG tablet [id number]  TAKE 1 TABLET(20  MG) BY MOUTH AT BEDTIME Olin Hauser, DO  Active   ?B-D UF III MINI PEN NEEDLES 31G X 5 MM MISC [id number]  USE ONCE DAILY AS DIRECTED Parks Ranger, Devonne Doughty, DO  Active   ?cetirizine (ZYRTEC) 10 MG tablet [id number] Yes Take 10 mg by mouth daily as needed for allergies. [provider] Taking Active   ?Cholecalciferol (D 1000) 1000 units capsule [id number]  Take by mouth.  [provider]  Active   ?         ?Med Note Barnet Pall, JAMIE A   Thu May 10, 2015  9:44 AM) Received from: [facility]  ?Continuous Blood Gluc Sensor (DEXCOM G6 SENSOR) MISC [id number]  Use as directed Olin Hauser, DO  Active   ?Continuous Blood Gluc Transmit (DEXCOM G6 TRANSMITTER) MISC [id number]  Use as directed Olin Hauser, DO  Active   ?Cyanocobalamin (VITAMIN B-12) 5000 MCG SUBL [id number]  Place under the tongue. [provider]  Active   ?         ?Med Note Barnet Pall, JAMIE A   Thu May 10, 2015  9:44 AM) Received from: [facility]  ?desonide (DESOWEN) 0.05 % cream [id number]  Apply topically daily as needed. Olin Hauser, DO  Active   ?empagliflozin (JARDIANCE) 10 MG TABS tablet [id number] Yes Take 1 tablet (10 mg total) by mouth daily. Olin Hauser, DO Taking Active   ?gabapentin (NEURONTIN) 300 MG capsule [id number]  Take 300 mg by mouth continuous as needed.  [provider]  Active   ?         ?Med Note (HOLDEN, JAMIE  A   Thu May 10, 2015  9:44 AM) Received from: [facility]  ?ibuprofen (ADVIL,MOTRIN) 800 MG tablet [id number]  Take 800 mg by mouth every 6 (six) hours as needed.   ?Patient not taking: Reported on 04/03/2021  ? [provider]  Active   ?         ?Med Note Barnet Pall, JAMIE A   Thu May 10, 2015  9:44 AM) Received from: [facility]  ?ipratropium (ATROVENT) 0.06 % nasal spray [id number]  USE 2 SPRAYS IN EACH NOSTRIL FOUR TIMES DAILY FOR UP TO 5 TO 7 DAYS THEN STOP Karamalegos, Devonne Doughty, DO  Active    ?ketoconazole (NIZORAL) 2 % shampoo [id number]  Apply 1 application topically daily as needed. Olin Hauser, DO  Active   ?Lancets [facility] ULTRASOFT) lancets [id number]  Check blood sugar three times daily. Olin Hauser, DO  Active   ?lisinopril (ZESTRIL) 2.5 MG tablet [id number]  TAKE 1 TABLET(2.5 MG) BY MOUTH DAILY Parks Ranger Devonne Doughty, DO  Active   ?Multiple Vitamins-Minerals (PRESERVISION AREDS PO) [id number]  Take by mouth. [provider]  Active   ?Ocrelizumab (OCREVUS IV) [id number]  Inject into the vein. [provider]  Active   ?ONETOUCH ULTRA test strip [id number]  USE TO CHECK BLOOD SUGAR THREE TIMES DAILY AS DIRECTED Parks Ranger Devonne Doughty, DO  Active   ?OZEMPIC, 2 MG/DOSE, 8 MG/3ML SOPN [id number] Yes Inject 2 mg into the skin once a week. Olin Hauser, DO Taking Active   ?tamsulosin (FLOMAX) 0.4 MG CAPS capsule [id number]  TAKE ONE CAPSULE BY MOUTH DAILY AS NEEDED FOR KIDNEY STONE FOR 7 TO 14 DAYS Parks Ranger Devonne Doughty, DO  Active   ?tiZANidine (ZANAFLEX) 4 MG tablet [id number]  TAKE 1/2 TO 1 TABLET(2 TO 4 MG) BY MOUTH EVERY 8 HOURS AS NEEDED FOR MUSCLE SPASMS Karamalegos, Devonne Doughty, DO  Active   ?traMADol (ULTRAM) 50 MG tablet [id number]  Take 50 mg by mouth daily as needed. [provider]  Active   ?TRESIBA FLEXTOUCH 100 UNIT/ML FlexTouch Pen [id number] Yes ADMINISTER 20 UNITS UNDER THE SKIN DAILY  ?Patient taking differently: Inject 14 Units into the skin daily.  ? Olin Hauser, DO Taking Active   ? ?  ?  ? ?  ? ? ? ?Care Plan : PharmD - Medication Management/T2DM  ?Updates made by Rennis Petty, RPH-CPP since 09/02/2021 12:00 AM  ?  ? ?Problem: Disease Progression   ?  ? ?Long-Range Goal: Disease Progression Prevented or Minimized   ?Start Date: 04/03/2021  ?Expected End Date: 07/02/2021  ?This Visit's Progress: On track  ?Recent Progress: On track  ?Priority: High  ?Note:   ?Current Barriers:  ?Unable to achieve  control of A1C  ?PA approved for Dexcom G6 Continuous Glucose Monitoring (CGM) - 03/2021 ?Notes blood sugar control impacted by receiving steroid infusions and injections: ?Receives steroid injections ~every 3 months for pain management and steroid infusions every 6 months with multiple sclerosis treatment (Ocrelizumab infusion) ?Reported follows direction from PCP for how can adjust long-acting insulin on days of steroid infusion/injection ? ?Pharmacist Clinical Goal(s):  ?patient will achieve control of blood sugar as evidenced by A1C  through collaboration with PharmD and provider.  ? ? ?Interventions: ?1:1 collaboration with Olin Hauser, DO regarding development and update of comprehensive plan of care as evidenced by provider attestation and co-signature ?Inter-disciplinary care team collaboration (see longitudinal plan of care) ?Patient seen for [facility] Visit with [facility]  on 08/09/2021 ?Today reports having more allergy symptoms. Taking cetirizine 10 mg daily. Discuss using OTC Nasacort nasal spray as needed for symptoms ?Will follow up with PCP if symptoms worsen. ?  ?Type 2 Diabetes: ?Current treatment: ?Ozempic 2 mg weekly on Sundays  ?Tyler Aas FlexTouch 14 units daily in evening (Reports self-adjusted dose down as blood sugar readings improved with starting Jardiance) ?Jardiance 10 mg daily with breakfast ?Stopped metformin as discussed ?Results from past 14-days for Dexcom G6 Continuous Glucose Meter (CGM) from [facility] Clarity system: ?Target goal range: 70-180 ?  ?  ?Patient using CGM as tool to reinforce/aid with dietary choices and improve blood sugar control ?Discuss impact of diet, exercise, weight loss on blood sugar control ?Reports maintained recently lost ~10 lbs ?Counsel on importance of having regular well-balanced meals throughout the day and controlling carbohydrate portion sizes ?Exercise: Reports currently going to physical therapy twice/week ?Has reported Ozempic 2  mg weekly helping with appetite control/weight loss ?Statin therapy: atorvastatin 20 mg daily ?Today patient interested in increasing dose of Jardiance with goal of decreasing Tresiba dose in future ?Lab order fo

## 2021-09-02 NOTE — Patient Instructions (Signed)
Thank you allowing the Care Management Team to be a part of your care! It was a pleasure speaking with you today! ?  ?  ?Care Management Team  ?  ?Noreene Larsson RN, MSN, CCM ?Nurse Care Coordinator  ?[phone] ?  ?Wallace Cullens, PharmD  ?Clinical Pharmacist  ?[phone] ?  ?Christa See, MSW, LCSW ?Clinical Social Worker ?[phone] ? ? ?Visit Information ? ? Goals Addressed   ? ?  ?  ?  ?  ? This Visit's Progress  ?  Pharmacy Goals     ?  Our goal A1c is less than 7%. This corresponds with fasting sugars less than 130 and 2 hour after meal sugars less than 180. Please check your blood sugar and keep a log of the results ? ?Our goal bad cholesterol, or LDL, is less than 70 . This is why it is important to continue taking your atorvastatin. ? ?Feel free to call me with any questions or concerns. I look forward to our next call! ? ? ? ?Wallace Cullens, PharmD, BCACP, CPP ?Clinical Pharmacist ?[facility] ?[place] ?[phone] ?  ? ?  ? ? ?Patient verbalizes understanding of instructions and care plan provided today and agrees to view in [place]. Active MyChart status confirmed with patient.   ? ?Telephone follow up appointment with care management team member scheduled for:09/09/2021 at 9 am ? ?

## 2021-09-05 ENCOUNTER — Other Ambulatory Visit: Payer: BC Managed Care – PPO

## 2021-09-05 DIAGNOSIS — Z794 Long term (current) use of insulin: Secondary | ICD-10-CM

## 2021-09-06 LAB — BASIC METABOLIC PANEL
BUN/Creatinine Ratio: 26 (calc) — ABNORMAL HIGH (ref 6–22)
BUN: 17 mg/dL (ref 7–25)
CO2: 23 mmol/L (ref 20–32)
Calcium: 9.1 mg/dL (ref 8.6–10.3)
Chloride: 106 mmol/L (ref 98–110)
Creat: 0.65 mg/dL — ABNORMAL LOW (ref 0.70–1.30)
Glucose, Bld: 153 mg/dL — ABNORMAL HIGH (ref 65–99)
Potassium: 3.9 mmol/L (ref 3.5–5.3)
Sodium: 141 mmol/L (ref 135–146)

## 2021-09-09 ENCOUNTER — Ambulatory Visit: Payer: BC Managed Care – PPO | Admitting: Pharmacist

## 2021-09-09 DIAGNOSIS — E1169 Type 2 diabetes mellitus with other specified complication: Secondary | ICD-10-CM

## 2021-09-09 MED ORDER — EMPAGLIFLOZIN 25 MG PO TABS: 25.0000 mg | ORAL_TABLET | Freq: Every day | ORAL | 0 refills | Status: DC

## 2021-09-09 NOTE — Patient Instructions (Signed)
Thank you allowing the Care Management Team to be a part of your care! It was a pleasure speaking with you today! ?  ?  ?Care Management Team  ?  ?Noreene Larsson RN, MSN, CCM ?Nurse Care Coordinator  ?[phone] ?  ?Wallace Cullens, PharmD  ?Clinical Pharmacist  ?[phone] ?  ?Christa See, MSW, LCSW ?Clinical Social Worker ?[phone] ? ? ?Visit Information ? ? Goals Addressed   ? ?  ?  ?  ?  ? This Visit's Progress  ?  Pharmacy Goals     ?  Our goal A1c is less than 7%. This corresponds with fasting sugars less than 130 and 2 hour after meal sugars less than 180. Please check your blood sugar and keep a log of the results ? ?Our goal bad cholesterol, or LDL, is less than 70 . This is why it is important to continue taking your atorvastatin. ? ?Feel free to call me with any questions or concerns. I look forward to our next call! ? ? ?Wallace Cullens, PharmD, BCACP, CPP ?Clinical Pharmacist ?[facility] ?[place] ?[phone] ?  ? ?  ? ? ?Patient verbalizes understanding of instructions and care plan provided today and agrees to view in [place]. Active MyChart status confirmed with patient.   ? ?Telephone follow up appointment with care management team member scheduled for: 10/07/2021 at 9 am ? ?

## 2021-09-09 NOTE — Chronic Care Management (AMB) (Signed)
?Care Management  ? ?Pharmacy Note ? ?09/09/2021 ?Name: Miguel [name] MRN: [id number] DOB: April 18, 1971 ? ?Subjective: ?Miguel [name] is a 51 y.o. year old male who is a primary care patient of Olin Hauser, DO. The Care Management team was consulted for assistance with care management and care coordination needs.   ? ?Engaged with patient by telephone for follow up visit in response to provider referral for pharmacy case management and/or care coordination services.  ? ?The patient was given information about Care Management services today including:  ?Care Management services includes personalized support from designated clinical staff supervised by the patient's primary care provider, including individualized plan of care and coordination with other care providers. ?24/7 contact phone numbers for assistance for urgent and routine care needs. ?The patient may stop case management services at any time by phone call to the office staff. ? ?Patient agreed to services and consent obtained. ? ?Assessment:  Review of patient status, including review of consultants reports, laboratory and other test data, was performed as part of comprehensive evaluation and provision of chronic care management services.  ? ?SDOH (Social Determinants of Health) assessments and interventions performed:   ? ?Objective: ? ?Lab Results  ?Component Value Date  ? CREATININE 0.65 (L) 09/05/2021  ? BUN 17 09/05/2021  ? NA 141 09/05/2021  ? K 3.9 09/05/2021  ? CL 106 09/05/2021  ? CO2 23 09/05/2021  ? ? ?Lab Results  ?Component Value Date  ? CREATININE 0.65 (L) 09/05/2021  ? CREATININE 0.72 03/19/2021  ? CREATININE 0.76 03/15/2020  ? ? ?Lab Results  ?Component Value Date  ? HGBA1C 8.6 (A) 07/08/2021  ? ? ? ?Care Plan ? ?No Known Allergies ? ?Medications Reviewed Today   ? ? Reviewed by Rennis Petty, RPH-CPP (Pharmacist) on 09/09/21 at [place] List Status: <None>  ? ?Medication Order Taking? Sig Documenting Provider  Last Dose Status Informant  ?atorvastatin (LIPITOR) 20 MG tablet [id number]  TAKE 1 TABLET(20 MG) BY MOUTH AT BEDTIME Olin Hauser, DO  Active   ?B-D UF III MINI PEN NEEDLES 31G X 5 MM MISC [id number]  USE ONCE DAILY AS DIRECTED Parks Ranger, Devonne Doughty, DO  Active   ?cetirizine (ZYRTEC) 10 MG tablet [id number]  Take 10 mg by mouth daily as needed for allergies. [provider]  Active   ?Cholecalciferol (D 1000) 1000 units capsule [id number]  Take by mouth.  [provider]  Active   ?         ?Med Note Barnet Pall, JAMIE A   Thu May 10, 2015  9:44 AM) Received from: [facility]  ?Continuous Blood Gluc Sensor (DEXCOM G6 SENSOR) MISC [id number]  Use as directed Olin Hauser, DO  Active   ?Continuous Blood Gluc Transmit (DEXCOM G6 TRANSMITTER) MISC [id number]  Use as directed Olin Hauser, DO  Active   ?Cyanocobalamin (VITAMIN B-12) 5000 MCG SUBL [id number]  Place under the tongue. [provider]  Active   ?         ?Med Note Barnet Pall, JAMIE A   Thu May 10, 2015  9:44 AM) Received from: [facility]  ?desonide (DESOWEN) 0.05 % cream [id number]  Apply topically daily as needed. Olin Hauser, DO  Active   ?empagliflozin (JARDIANCE) 25 MG TABS tablet [id number] Yes Take 1 tablet (25 mg total) by mouth daily before breakfast. Olin Hauser, DO  Active   ?gabapentin (NEURONTIN) 300 MG capsule [id number]  Take 300 mg  by mouth continuous as needed.  [provider]  Active   ?         ?Med Note Barnet Pall, JAMIE A   Thu May 10, 2015  9:44 AM) Received from: [facility]  ?ibuprofen (ADVIL,MOTRIN) 800 MG tablet [id number]  Take 800 mg by mouth every 6 (six) hours as needed.   ?Patient not taking: Reported on 04/03/2021  ? [provider]  Active   ?         ?Med Note Barnet Pall, JAMIE A   Thu May 10, 2015  9:44 AM) Received from: [facility]  ?ipratropium (ATROVENT) 0.06 % nasal spray [id number]  USE 2 SPRAYS IN EACH NOSTRIL FOUR  TIMES DAILY FOR UP TO 5 TO 7 DAYS THEN STOP Karamalegos, Devonne Doughty, DO  Active   ?ketoconazole (NIZORAL) 2 % shampoo [id number]  Apply 1 application topically daily as needed. Olin Hauser, DO  Active   ?Lancets [facility] ULTRASOFT) lancets [id number]  Check blood sugar three times daily. Olin Hauser, DO  Active   ?lisinopril (ZESTRIL) 2.5 MG tablet [id number]  TAKE 1 TABLET(2.5 MG) BY MOUTH DAILY Parks Ranger Devonne Doughty, DO  Active   ?Multiple Vitamins-Minerals (PRESERVISION AREDS PO) [id number]  Take by mouth. [provider]  Active   ?Ocrelizumab (OCREVUS IV) [id number]  Inject into the vein. [provider]  Active   ?ONETOUCH ULTRA test strip [id number]  USE TO CHECK BLOOD SUGAR THREE TIMES DAILY AS DIRECTED Parks Ranger Devonne Doughty, DO  Active   ?OZEMPIC, 2 MG/DOSE, 8 MG/3ML SOPN [id number] Yes Inject 2 mg into the skin once a week. Olin Hauser, DO Taking Active   ?tamsulosin (FLOMAX) 0.4 MG CAPS capsule [id number]  TAKE ONE CAPSULE BY MOUTH DAILY AS NEEDED FOR KIDNEY STONE FOR 7 TO 14 DAYS Parks Ranger Devonne Doughty, DO  Active   ?tiZANidine (ZANAFLEX) 4 MG tablet [id number]  TAKE 1/2 TO 1 TABLET(2 TO 4 MG) BY MOUTH EVERY 8 HOURS AS NEEDED FOR MUSCLE SPASMS Karamalegos, Devonne Doughty, DO  Active   ?traMADol (ULTRAM) 50 MG tablet [id number]  Take 50 mg by mouth daily as needed. [provider]  Active   ?TRESIBA FLEXTOUCH 100 UNIT/ML FlexTouch Pen [id number] Yes ADMINISTER 20 UNITS UNDER THE SKIN DAILY  ?Patient taking differently: Inject 14 Units into the skin daily.  ? Olin Hauser, DO Taking Active   ? ?  ?  ? ?  ? ? ?Care Plan : PharmD - Medication Management/T2DM  ?Updates made by Rennis Petty, RPH-CPP since 09/09/2021 12:00 AM  ?  ? ?Problem: Disease Progression   ?  ? ?Long-Range Goal: Disease Progression Prevented or Minimized   ?Start Date: 04/03/2021  ?Expected End Date: 07/02/2021  ?This Visit's Progress: On track  ?Recent  Progress: On track  ?Priority: High  ?Note:   ?Current Barriers:  ?Unable to achieve control of A1C  ?PA approved for Dexcom G6 Continuous Glucose Monitoring (CGM) - 03/2021 ?Notes blood sugar control impacted by receiving steroid infusions and injections: ?Receives steroid injections ~every 3 months for pain management and steroid infusions every 6 months with multiple sclerosis treatment (Ocrelizumab infusion) ?Reported follows direction from PCP for how can adjust long-acting insulin on days of steroid infusion/injection ? ?Pharmacist Clinical Goal(s):  ?patient will achieve control of blood sugar as evidenced by A1C  through collaboration with PharmD and provider.  ? ? ?Interventions: ?1:1 collaboration with Olin Hauser, DO regarding development and update of comprehensive plan of care as  evidenced by provider attestation and co-signature ?Inter-disciplinary care team collaboration (see longitudinal plan of care) ?Reports staying hydrated ?  ?Type 2 Diabetes: ?Current treatment: ?Ozempic 2 mg weekly on Sundays  ?Tyler Aas FlexTouch 14 units daily in evening ?Jardiance 10 mg daily with breakfast ?Reports tolerating well ?Results from past 14-days for Dexcom G6 Continuous Glucose Meter (CGM) from [facility] Clarity system: ?Target goal range: 70-180 ?  ?  ?Patient using CGM as tool to reinforce/aid with dietary choices and improve blood sugar control ?Note no CGM data captured 4/18-4/22. Patient reports did not have a sensor while waiting on refill from pharmacy, but confirms monitored with glucometer ?Have discussed impact of diet, exercise, weight loss on blood sugar control ?Reported maintained recently lost ~10 lbs ?Counsel on importance of having regular well-balanced meals throughout the day and controlling carbohydrate portion sizes ?Exercise: Reports going to physical therapy twice/week ?Has reported Ozempic 2 mg weekly helping with appetite control/weight loss ?Reports drinks Boost Glucose Control  for breakfast ?Statin therapy: atorvastatin 20 mg daily ?Review results of basic metabolic panel lab work from 09/05/2021 ?Patient interested in increasing dose of Jardiance with goal of decreasing Tresiba dose

## 2021-09-16 ENCOUNTER — Other Ambulatory Visit: Payer: Self-pay | Admitting: Family Medicine

## 2021-09-16 DIAGNOSIS — Z794 Long term (current) use of insulin: Secondary | ICD-10-CM

## 2021-09-16 NOTE — Telephone Encounter (Signed)
Medication Refill - Medication: OZEMPIC, 2 MG/DOSE, 8 MG/3ML SOPN  ? ?Has the patient contacted their pharmacy? Yes.   ?(Agent: If yes, when and what did the pharmacy advise?) Need new Rx  ? ?Preferred Pharmacy (with phone number or street name):  ?[facility] [place], [address] AT [facility] [place] Phone:  [phone]  ?Fax:  [phone]  ?  ? ?Has the patient been seen for an appointment in the last year OR does the patient have an upcoming appointment? Yes.   ? ?Agent: Please be advised that RX refills may take up to 3 business days. We ask that you follow-up with your pharmacy. ? ?

## 2021-09-17 MED ORDER — OZEMPIC (2 MG/DOSE) 8 MG/3ML ~~LOC~~ SOPN: 2.0000 mg | PEN_INJECTOR | SUBCUTANEOUS | 0 refills | Status: DC

## 2021-09-17 NOTE — Telephone Encounter (Signed)
Requested Prescriptions  ?Pending Prescriptions Disp Refills  ?? OZEMPIC, 2 MG/DOSE, 8 MG/3ML SOPN 9 mL 1  ?  Sig: Inject 2 mg into the skin once a week.  ?  ? Endocrinology:  Diabetes - GLP-1 Receptor Agonists - semaglutide Failed - 09/16/2021  2:09 PM  ?  ?  Failed - HBA1C in normal range and within 180 days  ?  Hemoglobin A1C  ?Date Value Ref Range Status  ?07/08/2021 8.6 (A) 4.0 - 5.6 % Final  ? ?Hgb A1c MFr Bld  ?Date Value Ref Range Status  ?03/19/2021 9.3 (H) <5.7 % of total Hgb Final  ?  Comment:  ?  For someone without known diabetes, a hemoglobin A1c ?value of 6.5% or greater indicates that they may have  ?diabetes and this should be confirmed with a follow-up  ?test. ?. ?For someone with known diabetes, a value <7% indicates  ?that their diabetes is well controlled and a value  ?greater than or equal to 7% indicates suboptimal  ?control. A1c targets should be individualized based on  ?duration of diabetes, age, comorbid conditions, and  ?other considerations. ?. ?Currently, no consensus exists regarding use of ?hemoglobin A1c for diagnosis of diabetes for children. ?. ?  ?   ?  ?  Failed - Cr in normal range and within 360 days  ?  Creat  ?Date Value Ref Range Status  ?09/05/2021 0.65 (L) 0.70 - 1.30 mg/dL Final  ?   ?  ?  Passed - Valid encounter within last 6 months  ?  Recent Outpatient Visits   ?      ? 2 months ago Type 2 diabetes mellitus with other specified complication, with long-term current use of insulin ([place])  ? [place], DO  ? 3 months ago Acute non-recurrent frontal sinusitis  ? [place], DO  ? 5 months ago Annual physical exam  ? [place], DO  ? 6 months ago History of nephrolithiasis  ? [place], DO  ? 10 months ago Type 2 diabetes mellitus with other specified complication, with long-term current use of insulin ([place])   ? [place], DO  ?  ?  ?Future Appointments   ?        ? In 1 month Karamalegos, Devonne Doughty, DO [facility] [place]  ? In 6 months Hollice Espy, MD [place]  ?  ? ?  ?  ?  ? ?

## 2021-10-07 ENCOUNTER — Ambulatory Visit: Payer: BC Managed Care – PPO | Admitting: Pharmacist

## 2021-10-07 DIAGNOSIS — Z794 Long term (current) use of insulin: Secondary | ICD-10-CM

## 2021-10-07 DIAGNOSIS — E1169 Type 2 diabetes mellitus with other specified complication: Secondary | ICD-10-CM

## 2021-10-07 NOTE — Chronic Care Management (AMB) (Signed)
Care Management   Pharmacy Note  10/07/2021 Name: Miguel [name] MRN: [id number] DOB: [birth year]  Subjective: Miguel [name] is a 51 y.o. year old male who is a primary care patient of Olin Hauser, DO. The Care Management team was consulted for assistance with care management and care coordination needs.    Engaged with patient by telephone for follow up visit in response to provider referral for pharmacy case management and/or care coordination services.   The patient was given information about Care Management services today including:  Care Management services includes personalized support from designated clinical staff supervised by the patient's primary care provider, including individualized plan of care and coordination with other care providers. 24/7 contact phone numbers for assistance for urgent and routine care needs. The patient may stop case management services at any time by phone call to the office staff.  Patient agreed to services and consent obtained.  Assessment:  Review of patient status, including review of consultants reports, laboratory and other test data, was performed as part of comprehensive evaluation and provision of chronic care management services.   SDOH (Social Determinants of Health) assessments and interventions performed:    Objective:  Lab Results  Component Value Date   CREATININE 0.65 (L) 09/05/2021   CREATININE 0.72 03/19/2021   CREATININE 0.76 03/15/2020    Lab Results  Component Value Date   HGBA1C 8.6 (A) 07/08/2021       Component Value Date/Time   CHOL 158 03/19/2021 0913   TRIG 237 (H) 03/19/2021 0913   HDL 34 (L) 03/19/2021 0913   CHOLHDL 4.6 03/19/2021 0913   VLDL 47 (H) 01/07/2016 0809   LDLCALC 91 03/19/2021 0913    BP Readings from Last 3 Encounters:  07/08/21 115/80  03/26/21 126/82  03/25/21 122/71   Pulse Readings from Last 3 Encounters:  07/08/21 88  03/26/21 94  03/25/21 83     Care  Plan  No Known Allergies  Medications Reviewed Today     Reviewed by Rennis Petty, RPH-CPP (Pharmacist) on 10/07/21 at [phone]  Med List Status: <None>   Medication Order Taking? Sig Documenting Provider Last Dose Status Informant  atorvastatin (LIPITOR) 20 MG tablet [id number] Yes TAKE 1 TABLET(20 MG) BY MOUTH AT BEDTIME Olin Hauser, DO Taking Active   B-D UF III MINI PEN NEEDLES 31G X 5 MM MISC [id number]  USE ONCE DAILY AS DIRECTED Parks [place], Devonne Doughty, DO  Active   cetirizine (ZYRTEC) 10 MG tablet [id number]  Take 10 mg by mouth daily as needed for allergies. [provider]  Active   Cholecalciferol (D 1000) 1000 units capsule [id number]  Take by mouth.  [provider]  Active            Med Note Barnet Pall, JAMIE A   Thu May 10, 2015  9:44 AM) Received from: [facility]  Continuous Blood Gluc Sensor (DEXCOM G6 SENSOR) [place] [id number]  Use as directed Olin Hauser, DO  Active   Continuous Blood Gluc Transmit (DEXCOM G6 TRANSMITTER) MISC [id number]  Use as directed Olin Hauser, DO  Active   Cyanocobalamin (VITAMIN B-12) 5000 MCG SUBL [id number]  Place under the tongue. [provider]  Active            Med Note Barnet Pall, JAMIE A   Thu May 10, 2015  9:44 AM) Received from: [facility]  desonide (DESOWEN) 0.05 % cream [id number]  Apply topically daily as needed. Nobie Putnam  J, DO  Active   empagliflozin (JARDIANCE) 25 MG TABS tablet [id number] Yes Take 1 tablet (25 mg total) by mouth daily before breakfast. Olin Hauser, DO Taking Active   gabapentin (NEURONTIN) 300 MG capsule [id number]  Take 300 mg by mouth continuous as needed.  [provider]  Active            Med Note Barnet Pall, JAMIE A   Thu May 10, 2015  9:44 AM) Received from: [facility]  ibuprofen (ADVIL,MOTRIN) 800 MG tablet [id number]  Take 800 mg by mouth every 6 (six) hours as needed.   Patient not taking: Reported on  04/03/2021   [provider]  Active            Med Note Barnet Pall, JAMIE A   Thu May 10, 2015  9:44 AM) Received from: [facility]  ipratropium (ATROVENT) 0.06 % nasal spray [id number]  USE 2 SPRAYS IN EACH NOSTRIL FOUR TIMES DAILY FOR UP TO 5 TO 7 DAYS THEN STOP Karamalegos, Devonne Doughty, DO  Active   ketoconazole (NIZORAL) 2 % shampoo [id number]  Apply 1 application topically daily as needed. Olin Hauser, DO  Active   Lancets [facility] ULTRASOFT) lancets [id number]  Check blood sugar three times daily. Olin Hauser, DO  Active   lisinopril (ZESTRIL) 2.5 MG tablet [id number]  TAKE 1 TABLET(2.5 MG) BY MOUTH DAILY Parks Ranger, Devonne Doughty, DO  Active   Multiple Vitamins-Minerals (PRESERVISION AREDS PO) [id number]  Take by mouth. [provider]  Active   Ocrelizumab (OCREVUS IV) [id number]  Inject into the vein. [provider]  Active   Donald Siva test strip [id number]  USE TO CHECK BLOOD SUGAR THREE TIMES DAILY AS DIRECTED Parks Ranger Devonne Doughty, DO  Active   OZEMPIC, 2 MG/DOSE, 8 MG/3ML SOPN [id number] Yes Inject 2 mg into the skin once a week. Olin Hauser, DO Taking Active   tamsulosin (FLOMAX) 0.4 MG CAPS capsule [id number]  TAKE ONE CAPSULE BY MOUTH DAILY AS NEEDED FOR KIDNEY STONE FOR 7 TO 14 DAYS Karamalegos, Devonne Doughty, DO  Active   tiZANidine (ZANAFLEX) 4 MG tablet [id number]  TAKE 1/2 TO 1 TABLET(2 TO 4 MG) BY MOUTH EVERY 8 HOURS AS NEEDED FOR MUSCLE SPASMS Karamalegos, Devonne Doughty, DO  Active   traMADol (ULTRAM) 50 MG tablet [id number]  Take 50 mg by mouth daily as needed. [provider]  Active   TRESIBA FLEXTOUCH 100 UNIT/ML FlexTouch Pen [id number] Yes ADMINISTER 20 UNITS UNDER THE SKIN DAILY  Patient taking differently: Inject 12 Units into the skin daily.   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   Peripheral neuropathic pain 02/28/2015   Groin pain 07/04/2014   Incomplete bladder emptying 07/04/2014   Calculus of kidney 68/34/1962   Renal colic 22/97/9892   OSA on CPAP 12/01/2012   Multiple sclerosis ([place]) 11/09/2012   Exomphalos 11/09/2012    Care Plan : PharmD - Medication Management/T2DM  Updates made by Rennis Petty,  RPH-CPP since 10/07/2021 12:00 AM     Problem: Disease Progression      Long-Range Goal: Disease Progression Prevented or Minimized   Start Date: 04/03/2021  Expected End Date: 07/02/2021  This Visit's Progress: On track  Recent Progress: On track  Priority: High  Note:   Current Barriers:  Unable to achieve control of A1C  PA approved for Dexcom G6 Continuous Glucose Monitoring (CGM) - 03/2021 Notes blood sugar control impacted by receiving steroid infusions and injections: Receives steroid injections ~every 3 months for pain management and steroid infusions every 6 months with multiple sclerosis treatment (Ocrelizumab infusion) Reported follows direction from PCP for how can adjust long-acting insulin on days of steroid infusion/injection  Pharmacist Clinical Goal(s):  patient will achieve control of blood sugar as evidenced by A1C  through collaboration with PharmD and provider.    Interventions: 1:1 collaboration with Olin Hauser, DO regarding development and update of comprehensive plan of care as evidenced by provider attestation and co-signature Inter-disciplinary care team collaboration (see longitudinal plan of care) Reports staying hydrated   Type 2 Diabetes: Current  treatment: Ozempic 2 mg weekly on Sundays  Tresiba FlexTouch 12 units daily in evening Reports adjusted down by 2 units as discussed, following Jardiance dose increase Jardiance 25 mg daily with breakfast (dose increased on 4/24) Reports tolerating well Reports recently missed 2 doses over past week Results from past 14-days for Dexcom G6 Continuous Glucose Meter (CGM) from [place] system: Target goal range: 70-180     Patient using CGM as tool to reinforce/aid with dietary choices and improve blood sugar control Reports blood sugar control/CGM Time in Range was better in previous weeks after increased Jardiance dose, but that readings over past two weeks impacted by missed doses of Jardiance and "eating things that I should not have been" while traveling out of town Discuss importance of medication adherence. Reports now has a pill bottle specifically for carrying his medications when out of town Discuss impact of diet, exercise, weight loss on blood sugar control Reported maintained recently lost. Current weight ~224 lbs Counsel on importance of having regular well-balanced meals throughout the day and controlling carbohydrate portion sizes Exercise: walking throughout the day at at work/home Has reported Ozempic 2 mg weekly helping with appetite control/weight loss Drinks Boost Glucose Control for breakfast Statin therapy: atorvastatin 20 mg daily Counsel patient he may decrease Tresiba insulin by 2 units every 7 days if fasting blood sugars consistently <140 mg/dL Denies symptoms of hypoglycemia Counsel on s/s of hypoglycemia Confirms carries glucose tablets in case of symptoms of hypoglycemia   Patient Goals/Self-Care Activities patient will:  - check glucose, document, and provide at future appointments - collaborate with provider on medication access solutions  - attend medical appointments as scheduled  Next appointment with PCP on 6/22      Plan: Telephone follow  up appointment with care management team member scheduled for:  7/19 at 9:15 am  Wallace Cullens, PharmD, [place], [facility] [place] [phone]

## 2021-10-07 NOTE — Patient Instructions (Signed)
Thank you allowing the Care Management Team to be a part of your care! It was a pleasure speaking with you today!     Care Management Team    Noreene Larsson RN, MSN, CCM Nurse Care Coordinator  [phone]   Wallace Cullens, PharmD  Clinical Pharmacist  [phone]   Christa See, MSW, LCSW Clinical Social Worker [phone]   Visit Information   Goals Addressed             This Visit's Progress    Pharmacy Goals       Our goal A1c is less than 7%. This corresponds with fasting sugars less than 130 and 2 hour after meal sugars less than 180. Please check your blood sugar and keep a log of the results  Our goal bad cholesterol, or LDL, is less than 70 . This is why it is important to continue taking your atorvastatin.  Feel free to call me with any questions or concerns. I look forward to our next call!  [place]ace Cullens, PharmD, Para [place], CPP Clinical Pharmacist [facility] [phone]        Patient verbalizes understanding of instructions and care plan provided today and agrees to view in [place]. Active MyChart status and patient understanding of how to access instructions and care plan via MyChart confirmed with patient.     Telephone follow up appointment with care management team member scheduled for: 7/19 at 9:15 am

## 2021-10-11 ENCOUNTER — Other Ambulatory Visit: Payer: Self-pay | Admitting: Family Medicine

## 2021-10-11 DIAGNOSIS — Z794 Long term (current) use of insulin: Secondary | ICD-10-CM

## 2021-10-15 NOTE — Telephone Encounter (Signed)
Requested Prescriptions  Pending Prescriptions Disp Refills  . Continuous Blood Gluc Transmit (DEXCOM G6 TRANSMITTER) MISC [Pharmacy Med Name: DEXCOM G6 TRANSMITTER] 1 each 3    Sig: USE AS DIRECTED     Endocrinology: Diabetes - Testing Supplies Passed - 10/11/2021  2:36 PM      Passed - Valid encounter within last 12 months    Recent Outpatient Visits          3 months ago Type 2 diabetes mellitus with other specified complication, with long-term current use of insulin [facility])   [facility] [place], Devonne Doughty, DO   4 months ago Acute non-recurrent frontal sinusitis   [place], DO   6 months ago Annual physical exam   [facility] Olin Hauser, DO   7 months ago History of nephrolithiasis   [place], DO   10 months ago Type 2 diabetes mellitus with other specified complication, with long-term current use of insulin ([place])   [place], Devonne Doughty, DO      Future Appointments            In 3 weeks [place], Devonne Doughty, DO [facility], [place]   In 5 months Hollice Espy, [place]

## 2021-10-18 LAB — HM DIABETES EYE EXAM

## 2021-10-28 ENCOUNTER — Other Ambulatory Visit: Payer: Self-pay | Admitting: Family Medicine

## 2021-10-28 DIAGNOSIS — E1169 Type 2 diabetes mellitus with other specified complication: Secondary | ICD-10-CM

## 2021-10-29 NOTE — Telephone Encounter (Signed)
Requested Prescriptions  Pending Prescriptions Disp Refills  . TRESIBA FLEXTOUCH 100 UNIT/ML FlexTouch Pen [Pharmacy Med Name: TRESIBA FLEXTOUCH PEN ([id number]] 15 mL 0    Sig: ADMINISTER 20 UNITS UNDER THE SKIN DAILY     Endocrinology:  Diabetes - Insulins Failed - 10/28/2021  3:10 AM      Failed - HBA1C is between 0 and 7.9 and within 180 days    Hemoglobin A1C  Date Value Ref Range Status  07/08/2021 8.6 (A) 4.0 - 5.6 % Final   Hgb A1c MFr Bld  Date Value Ref Range Status  03/19/2021 9.3 (H) <5.7 % of total Hgb Final    Comment:    For someone without known diabetes, a hemoglobin A1c value of 6.5% or greater indicates that they may have  diabetes and this should be confirmed with a follow-up  test. . For someone with known diabetes, a value <7% indicates  that their diabetes is well controlled and a value  greater than or equal to 7% indicates suboptimal  control. A1c targets should be individualized based on  duration of diabetes, age, comorbid conditions, and  other considerations. . Currently, no consensus exists regarding use of hemoglobin A1c for diagnosis of diabetes for children. Renella Cunas - Valid encounter within last 6 months    Recent Outpatient Visits          3 months ago Type 2 diabetes mellitus with other specified complication, with long-term current use of insulin [facility])   [facility] [place], Devonne Doughty, DO   4 months ago Acute non-recurrent frontal sinusitis   [place], DO   7 months ago Annual physical exam   [facility] Olin Hauser, DO   7 months ago History of nephrolithiasis   [place], DO   11 months ago Type 2 diabetes mellitus with other specified complication, with long-term current use of insulin ([place])   [facility] [place], Devonne Doughty, DO      Future Appointments             In 1 week Parks Ranger, Devonne Doughty, DO [facility], [place]   In 4 months Hollice Espy, [place]

## 2021-11-07 ENCOUNTER — Ambulatory Visit: Payer: BC Managed Care – PPO | Admitting: Family Medicine

## 2021-11-07 ENCOUNTER — Other Ambulatory Visit: Payer: Self-pay | Admitting: Family Medicine

## 2021-11-07 ENCOUNTER — Encounter: Payer: Self-pay | Admitting: Family Medicine

## 2021-11-07 VITALS — BP 128/70 | HR 78 | Ht 68.0 in | Wt 228.0 lb

## 2021-11-07 DIAGNOSIS — E1169 Type 2 diabetes mellitus with other specified complication: Secondary | ICD-10-CM | POA: Diagnosis not present

## 2021-11-07 DIAGNOSIS — Z794 Long term (current) use of insulin: Secondary | ICD-10-CM

## 2021-11-07 DIAGNOSIS — G35 Multiple sclerosis: Secondary | ICD-10-CM

## 2021-11-07 DIAGNOSIS — Z125 Encounter for screening for malignant neoplasm of prostate: Secondary | ICD-10-CM

## 2021-11-07 DIAGNOSIS — Z0000 Encounter for general adult medical examination without abnormal findings: Secondary | ICD-10-CM

## 2021-11-07 LAB — POCT GLYCOSYLATED HEMOGLOBIN (HGB A1C): Hemoglobin A1C: 7.4 % — AB (ref 4.0–5.6)

## 2021-11-07 MED ORDER — EMPAGLIFLOZIN 25 MG PO TABS: 25.0000 mg | ORAL_TABLET | Freq: Every day | ORAL | 3 refills | Status: DC

## 2021-11-07 NOTE — Patient Instructions (Addendum)
Thank you for coming to the office today.  Look into [facility] new medicine.   Recent Labs    03/19/21 0913 07/08/21 0819 11/07/21 0856  HGBA1C 9.3* 8.6* 7.4*   Colon Cancer Screening: - For all adults age 51+ routine colon cancer screening is highly recommended.     - Recent guidelines from [place] recommend starting age of 56 - Early detection of colon cancer is important, because often there are no warning signs or symptoms, also if found early usually it can be cured. Late stage is hard to treat.  - If you are not interested in Colonoscopy screening (if done and normal you could be cleared for 5 to 10 years until next due), then Cologuard is an excellent alternative for screening test for Colon Cancer. It is highly sensitive for detecting DNA of colon cancer from even the earliest stages. Also, there is NO bowel prep required. - If Cologuard is NEGATIVE, then it is good for 3 years before next due - If Cologuard is POSITIVE, then it is strongly advised to get a Colonoscopy, which allows the GI doctor to locate the source of the cancer or polyp (even very early stage) and treat it by removing it. ------------------------- If you would like to proceed with Cologuard (stool DNA test) - FIRST, call your insurance company and tell them you want to check cost of Cologuard tell them CPT Code [phone] (it may be completely covered and you could get for no cost, OR max cost without any coverage is about $600). Also, keep in mind if you do NOT open the kit, and decide not to do the test, you will NOT be charged, you should contact the company if you decide not to do the test. - If you want to proceed, you can notify us (phone message, [place], or at next visit) and we will order it for you. The test kit will be delivered to you house within about 1 week. Follow instructions to collect sample, you may call the company for any help or questions, 24/7 telephone support at  [phone].   DUE for FASTING BLOOD WORK (no food or drink after midnight before the lab appointment, only water or coffee without cream/sugar on the morning of)  SCHEDULE "Lab Only" visit in the morning at the clinic for lab draw in 5 MONTHS   - Make sure Lab Only appointment is at about 1 week before your next appointment, so that results will be available  For Lab Results, once available within 2-3 days of blood draw, you can can log in to MyChart online to view your results and a brief explanation. Also, we can discuss results at next follow-up visit.   Please schedule a Follow-up Appointment to: Return in about 5 months (around 04/09/2022) for 5 month fasting lab only then 1 week later Annual Physical.  If you have any other questions or concerns, please feel free to call the office or send a message through [place]. You may also schedule an earlier appointment if necessary.  Additionally, you may be receiving a survey about your experience at our office within a few days to 1 week by e-mail or mail. We value your feedback.  Nobie Putnam, DO [place]

## 2021-11-07 NOTE — Progress Notes (Signed)
Subjective:    Patient ID: Miguel [name], male    DOB: [birth year], 51 y.o.   MRN: [id number]  Miguel [name] is a 51 y.o. male presenting on 11/07/2021 for Diabetes   HPI  FOLLOW-UP CHRONIC DM, Type 2: See prior notes for background information.  Dexcom G7 CGM Some days he has excellent control 90-100s, some days difficult to control and it can raise up without any known trigger. He has learned a lot about his patterns on a daily basis. Doing very well with it and working [employer]. Doing well on the newly added Jardiance '25mg'$  daily Continues Ozempic '2mg'$  weekly inj Insulin Tresiba down from 20 to 10-12 range, goal to come off completely - Remains OFF Metformin Currently on ACEi Lifestyle: Weight down 20 lbs in 5 months, and 10 lbs in 3 months Improving Diet. - Exercise - back in gym at therapy 2 x week - Last DM Eye 2023 already 09/2021 Denies hypoglycemia   Chronic Neuropathic Pain / Back Pain / Multiple Sclerosis Followed by [facility]. See outside record for details. Continues on disease modifying therapy infusion every few months, rarely has flare up requiring bag steroid infusion. - Taking Gabapentin, Tizanidine, Tramadol - temporary relief - Has been receiving spinal injections as well - Significant improvement still on Tizanidine 2-'4mg'$  dosage PRN with great results to help pain. Takes it early to prevent. Has rarely taken Baclofen PRN for spasms.    Health Maintenance: Due for Colon CA Screening, he is considering, see below.     07/08/2021    8:18 AM 03/07/2021    8:06 AM 03/22/2020    8:10 AM  Depression screen PHQ 2/9  Decreased Interest 0 0 0  Down, Depressed, Hopeless 0 0 0  PHQ - 2 Score 0 0 0  Altered sleeping 0 0   Tired, decreased energy 0 0   Change in appetite 0 0   Feeling bad or failure about yourself  0 0   Trouble concentrating 0 0   Moving slowly or fidgety/restless 0 0   Suicidal thoughts 0 0   PHQ-9 Score 0 0   Difficult  doing work/chores Not difficult at all Not difficult at all     Social History   Tobacco Use   Smoking status: Never   Smokeless tobacco: Never  Substance Use Topics   Alcohol use: No   Drug use: No    Review of Systems Per HPI unless specifically indicated above     Objective:    BP 128/70   Pulse 78   Ht '5\' 8"'$  (1.727 m)   Wt 228 lb (103.4 kg)   SpO2 98%   BMI 34.67 kg/m   Wt Readings from Last 3 Encounters:  11/07/21 228 lb (103.4 kg)  07/08/21 238 lb 3.2 oz (108 kg)  06/12/21 249 lb (112.9 kg)    Physical Exam Vitals and nursing note reviewed.  Constitutional:      General: He is not in acute distress.    Appearance: He is well-developed. He is not diaphoretic.     Comments: Well-appearing, comfortable, cooperative  HENT:     Head: Normocephalic and atraumatic.  Eyes:     General:        Right eye: No discharge.        Left eye: No discharge.     Conjunctiva/sclera: Conjunctivae normal.  Neck:     Thyroid: No thyromegaly.  Cardiovascular:     Rate and Rhythm:  Normal rate and regular rhythm.     Pulses: Normal pulses.     Heart sounds: Normal heart sounds. No murmur heard. Pulmonary:     Effort: Pulmonary effort is normal. No respiratory distress.     Breath sounds: Normal breath sounds. No wheezing or rales.  Musculoskeletal:        General: Normal range of motion.     Cervical back: Normal range of motion and neck supple.  Lymphadenopathy:     Cervical: No cervical adenopathy.  Skin:    General: Skin is warm and dry.     Findings: No erythema or rash.  Neurological:     Mental Status: He is alert and oriented to person, place, and time. Mental status is at baseline.  Psychiatric:        Behavior: Behavior normal.     Comments: Well groomed, good eye contact, normal speech and thoughts    Recent Labs    03/19/21 0913 07/08/21 0819 11/07/21 0856  HGBA1C 9.3* 8.6* 7.4*     Results for orders placed or performed in visit on 11/07/21  POCT  HgB A1C  Result Value Ref Range   Hemoglobin A1C 7.4 (A) 4.0 - 5.6 %      Assessment & Plan:   Problem List Items Addressed This Visit     Type 2 diabetes mellitus with other specified complication ([place]) - Primary    A1c down to 7.4 significantly improved CGM has improved his control overall Steroid courses PRN for MS No evidence of hypoglycemia on GLP and Insulin Failed Metformin '1000mg'$  BID (GI intolerance), XR was ineffective Complications - other including dyslipidemia with low HDL and history of hypertriglyceridemia, obesity, OSA - increases risk of future cardiovascular complications / poor glucose control due to OSA  Weight down significantly  Plan:  1. Continue Ozempic '2mg'$  weekly inj 2. Continue jardiance '25mg'$  daily 3. Continue insulin Tresiba 10-12 units daily, has lowered and continues to adjust 4. Encourage improved lifestyle - low carb, low sugar diet, reduce portion size, continue improving regular exercise 5. Check CBG, bring log to next visit for review again 6. Continue Statin, ACE  Considering Future Mounjaro, but pleased with ozempic currently.      Relevant Medications   empagliflozin (JARDIANCE) 25 MG TABS tablet   Other Relevant Orders   POCT HgB A1C (Completed)   Multiple sclerosis ([place])   Other Visit Diagnoses     Morbid obesity ([place])       Relevant Medications   empagliflozin (JARDIANCE) 25 MG TABS tablet         Considering Colonoscopy vs Cologuard  Meds ordered this encounter  Medications   empagliflozin (JARDIANCE) 25 MG TABS tablet    Sig: Take 1 tablet (25 mg total) by mouth daily before breakfast.    Dispense:  90 tablet    Refill:  3      Follow up plan: Return in about 5 months (around 04/09/2022) for 5 month fasting lab only then 1 week later Annual Physical.  Future labs ordered for 03/24/22   Nobie Putnam, [facility] 11/07/2021, 8:51 AM

## 2021-11-07 NOTE — Assessment & Plan Note (Signed)
A1c down to 7.4 significantly improved CGM has improved his control overall Steroid courses PRN for MS No evidence of hypoglycemia on GLP and Insulin Failed Metformin '1000mg'$  BID (GI intolerance), XR was ineffective Complications - other including dyslipidemia with low HDL and history of hypertriglyceridemia, obesity, OSA - increases risk of future cardiovascular complications / poor glucose control due to OSA  Weight down significantly  Plan:  1. Continue Ozempic '2mg'$  weekly inj 2. Continue jardiance '25mg'$  daily 3. Continue insulin Tresiba 10-12 units daily, has lowered and continues to adjust 4. Encourage improved lifestyle - low carb, low sugar diet, reduce portion size, continue improving regular exercise 5. Check CBG, bring log to next visit for review again 6. Continue Statin, ACE  Considering Future Mounjaro, but pleased with ozempic currently.

## 2021-12-04 ENCOUNTER — Ambulatory Visit: Payer: BC Managed Care – PPO | Admitting: Pharmacist

## 2021-12-04 DIAGNOSIS — E1169 Type 2 diabetes mellitus with other specified complication: Secondary | ICD-10-CM

## 2021-12-04 NOTE — Patient Instructions (Signed)
Thank you allowing the Care Management Team to be a part of your care! It was a pleasure speaking with you today!     Care Management Team    Noreene Larsson RN, MSN, CCM Nurse Care Coordinator  [phone]   Wallace Cullens, PharmD  Clinical Pharmacist  [phone]   Christa See, MSW, LCSW Clinical Social Worker [phone]   Visit Information   Goals Addressed             This Visit's Progress    Pharmacy Goals       Our goal A1c is less than 7%. This corresponds with fasting sugars less than 130 and 2 hour after meal sugars less than 180. Please check your blood sugar and keep a log of the results  Our goal bad cholesterol, or LDL, is less than 70 . This is why it is important to continue taking your atorvastatin.  Feel free to call me with any questions or concerns. I look forward to our next call!  Wallace Cullens, PharmD, Para [place], CPP Clinical Pharmacist [facility] [phone]        Patient verbalizes understanding of instructions and care plan provided today and agrees to view in [place]. Active MyChart status and patient understanding of how to access instructions and care plan via MyChart confirmed with patient.     Telephone follow up appointment with care management team member scheduled for:02/19/2022 at 9:15 AM

## 2021-12-04 NOTE — Chronic Care Management (AMB) (Signed)
Care Management   Pharmacy Note  12/04/2021 Name: Miguel [name] MRN: [id number] DOB: [birth year]  Subjective: Miguel [name] is a 51 y.o. year old male who is a primary care patient of Olin Hauser, DO. The Care Management team was consulted for assistance with care management and care coordination needs.    Engaged with patient by telephone for follow up visit in response to provider referral for pharmacy case management and/or care coordination services.   The patient was given information about Care Management services today including:  Care Management services includes personalized support from designated clinical staff supervised by the patient's primary care provider, including individualized plan of care and coordination with other care providers. 24/7 contact phone numbers for assistance for urgent and routine care needs. The patient may stop case management services at any time by phone call to the office staff.  Patient agreed to services and consent obtained.  Assessment:  Review of patient status, including review of consultants reports, laboratory and other test data, was performed as part of comprehensive evaluation and provision of chronic care management services.   SDOH (Social Determinants of Health) assessments and interventions performed:    Objective:  Lab Results  Component Value Date   CREATININE 0.65 (L) 09/05/2021   CREATININE 0.72 03/19/2021   CREATININE 0.76 03/15/2020    Lab Results  Component Value Date   HGBA1C 7.4 (A) 11/07/2021       Component Value Date/Time   CHOL 158 03/19/2021 0913   TRIG 237 (H) 03/19/2021 0913   HDL 34 (L) 03/19/2021 0913   CHOLHDL 4.6 03/19/2021 0913   VLDL 47 (H) 01/07/2016 0809   LDLCALC 91 03/19/2021 0913    BP Readings from Last 3 Encounters:  11/07/21 128/70  07/08/21 115/80  03/26/21 126/82    Care Plan  No Known Allergies  Medications Reviewed Today     Reviewed by Rennis Petty, RPH-CPP (Pharmacist) on 12/04/21 at 0954  Med List Status: <None>   Medication Order Taking? Sig Documenting Provider Last Dose Status Informant  atorvastatin (LIPITOR) 20 MG tablet [id number]  TAKE 1 TABLET(20 MG) BY MOUTH AT BEDTIME Olin Hauser, DO  Active   B-D UF III MINI PEN NEEDLES 31G X 5 MM MISC [id number]  USE ONCE DAILY AS DIRECTED Parks Ranger, Devonne Doughty, DO  Active   cetirizine (ZYRTEC) 10 MG tablet [id number]  Take 10 mg by mouth daily as needed for allergies. [provider]  Active   Cholecalciferol (D 1000) 1000 units capsule [id number]  Take by mouth.  [provider]  Active            Med Note Barnet Pall, JAMIE A   Thu May 10, 2015  9:44 AM) Received from: [facility]  Continuous Blood Gluc Sensor (DEXCOM G6 SENSOR) [place] [id number]  Use as directed Olin Hauser, DO  Active   Continuous Blood Gluc Transmit (DEXCOM G6 TRANSMITTER) MISC [id number]  USE AS DIRECTED Olin Hauser, DO  Active   Cyanocobalamin (VITAMIN B-12) 5000 MCG SUBL [id number]  Place under the tongue. [provider]  Active            Med Note Barnet Pall, JAMIE A   Thu May 10, 2015  9:44 AM) Received from: [facility]  desonide (DESOWEN) 0.05 % cream [id number]  Apply topically daily as needed. Olin Hauser, DO  Active   empagliflozin (JARDIANCE) 25 MG TABS tablet [id number] Yes Take 1 tablet (25  mg total) by mouth daily before breakfast. Olin Hauser, DO Taking Active   gabapentin (NEURONTIN) 300 MG capsule [id number]  Take 300 mg by mouth continuous as needed.  [provider]  Active            Med Note Barnet Pall, JAMIE A   Thu May 10, 2015  9:44 AM) Received from: [facility]  ibuprofen (ADVIL,MOTRIN) 800 MG tablet [id number]  Take 800 mg by mouth every 6 (six) hours as needed. [provider]  Active            Med Note Barnet Pall, JAMIE A   Thu May 10, 2015  9:44 AM) Received from: [facility]   ipratropium (ATROVENT) 0.06 % nasal spray [id number]  USE 2 SPRAYS IN EACH NOSTRIL FOUR TIMES DAILY FOR UP TO 5 TO 7 DAYS THEN STOP Karamalegos, Devonne Doughty, DO  Active   ketoconazole (NIZORAL) 2 % shampoo [id number]  Apply 1 application topically daily as needed. Olin Hauser, DO  Active   Lancets [facility] ULTRASOFT) lancets [id number]  Check blood sugar three times daily. Olin Hauser, DO  Active   lisinopril (ZESTRIL) 2.5 MG tablet [id number]  TAKE 1 TABLET(2.5 MG) BY MOUTH DAILY Parks Ranger, Devonne Doughty, DO  Active   Multiple Vitamins-Minerals (PRESERVISION AREDS PO) [id number]  Take by mouth. [provider]  Active   Ocrelizumab (OCREVUS IV) [id number]  Inject into the vein. [provider]  Active   Donald Siva test strip [id number]  USE TO CHECK BLOOD SUGAR THREE TIMES DAILY AS DIRECTED Parks Ranger Devonne Doughty, DO  Active   OZEMPIC, 2 MG/DOSE, 8 MG/3ML SOPN [id number] Yes Inject 2 mg into the skin once a week. Olin Hauser, DO Taking Active   tamsulosin (FLOMAX) 0.4 MG CAPS capsule [id number]  TAKE ONE CAPSULE BY MOUTH DAILY AS NEEDED FOR KIDNEY STONE FOR 7 TO 14 DAYS Karamalegos, Devonne Doughty, DO  Active   tiZANidine (ZANAFLEX) 4 MG tablet [id number]  TAKE 1/2 TO 1 TABLET(2 TO 4 MG) BY MOUTH EVERY 8 HOURS AS NEEDED FOR MUSCLE SPASMS Karamalegos, Devonne Doughty, DO  Active   traMADol (ULTRAM) 50 MG tablet [id number]  Take 50 mg by mouth daily as needed. [provider]  Active   TRESIBA FLEXTOUCH 100 UNIT/ML FlexTouch Pen [id number] Yes ADMINISTER 20 UNITS UNDER THE SKIN DAILY  Patient taking differently: Inject 12 Units into the skin daily.   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   Peripheral neuropathic pain 02/28/2015   Groin pain 07/04/2014   Incomplete bladder emptying 07/04/2014   Calculus of kidney 22/29/7989   Renal colic 21/19/4174   OSA on CPAP 12/01/2012   Multiple sclerosis ([place]) 11/09/2012   Exomphalos 11/09/2012    Care Plan : PharmD - Medication Management/T2DM  Updates made by Rennis Petty, RPH-CPP since 12/04/2021 12:00 AM     Problem: Disease Progression      Long-Range Goal: Disease Progression Prevented or Minimized   Start Date:  04/03/2021  Expected End Date: 07/02/2021  Recent Progress: On track  Priority: High  Note:   Current Barriers:  Unable to achieve control of A1C  PA approved for Dexcom G6 Continuous Glucose Monitoring (CGM) - 03/2021 Notes blood sugar control impacted by receiving steroid infusions and injections: Receives steroid injections ~every 3 months for pain management and steroid infusions every 6 months with multiple sclerosis treatment (Ocrelizumab infusion) Reported follows direction from PCP for how can adjust long-acting insulin on days of steroid infusion/injection  Pharmacist Clinical Goal(s):  patient will achieve control of blood sugar as evidenced by A1C  through collaboration with PharmD and provider.    Interventions: 1:1 collaboration with Olin Hauser, DO regarding development and update of comprehensive plan of care as evidenced by provider attestation and co-signature Inter-disciplinary care team collaboration (see longitudinal plan of care) Perform chart review.  Patient seen for Office Visit with PCP on 6/22 A1C improved to 7.4% Office Visit with [facility] on 6/22 Reports staying hydrated   Type 2 Diabetes: Current treatment: Ozempic 2 mg weekly  on Sundays  Tresiba FlexTouch 12 units daily in evening Jardiance 25 mg daily with breakfast  Reports missed Ozempic dose on 7/2 (out of town for a week and forgot Ozempic at home) Results from past 14-days for Dexcom G6 Continuous Glucose Meter (CGM) from [employer] system: Target goal range: 70-180     Patient using CGM as tool to reinforce/aid with dietary choices and improve blood sugar control Discuss importance of having regular well-balanced meals throughout the day and controlling carbohydrate portion sizes Some days drinks Boost Glucose Control for breakfast Reports staying well hydrated Exercise: walking most mornings and throughout the day at at work/home Reports continues to work on weight loss. Current weight ~220-222 lbs Statin therapy: atorvastatin 20 mg daily Denies symptoms of hypoglycemia Counsel on s/s of hypoglycemia Confirms carries glucose tablets in case of symptoms of hypoglycemia Discuss future option of switching from Ozempic to [facility] for potential greater weight loss benefit/A1C lowering Patient interested in making this change in the future if [place] covered through his insurance From review of prescription formulary today from [place] website, [place] currently not covered Patient interested in possible switch from [place] to [place] CGM in future Note Dexcom G7 CGM would also require prior authorization through patient's health plan Patient to let CM Pharmacist or PCP know if interested in making this change in the future   Patient Goals/Self-Care Activities patient will:  - check glucose, document, and provide at future appointments - collaborate with provider on medication access solutions  - attend medical appointments as scheduled     Plan: Telephone follow up appointment with care management team member scheduled for:  02/19/2022 at [place], PharmD, Para March, [place] [phone]

## 2021-12-07 ENCOUNTER — Other Ambulatory Visit: Payer: Self-pay | Admitting: Family Medicine

## 2021-12-07 DIAGNOSIS — E1169 Type 2 diabetes mellitus with other specified complication: Secondary | ICD-10-CM

## 2021-12-08 ENCOUNTER — Other Ambulatory Visit: Payer: Self-pay | Admitting: Family Medicine

## 2021-12-08 DIAGNOSIS — E1169 Type 2 diabetes mellitus with other specified complication: Secondary | ICD-10-CM

## 2021-12-08 DIAGNOSIS — E118 Type 2 diabetes mellitus with unspecified complications: Secondary | ICD-10-CM

## 2021-12-08 DIAGNOSIS — E0800 Diabetes mellitus due to underlying condition with hyperosmolarity without nonketotic hyperglycemic-hyperosmolar coma (NKHHC): Secondary | ICD-10-CM

## 2021-12-09 NOTE — Telephone Encounter (Signed)
Rx 11/07/21 #90 3RF- 1 yr- should be on file Requested Prescriptions  Pending Prescriptions Disp Refills  . JARDIANCE 25 MG TABS tablet [Pharmacy Med Name: JARDIANCE 25MG TABLETS] 90 tablet 3    Sig: TAKE 1 TABLET(25 MG) BY MOUTH DAILY BEFORE BREAKFAST     Endocrinology:  Diabetes - SGLT2 Inhibitors Failed - 12/07/2021  3:11 AM      Failed - Cr in normal range and within 360 days    Creat  Date Value Ref Range Status  09/05/2021 0.65 (L) 0.70 - 1.30 mg/dL Final         Passed - HBA1C is between 0 and 7.9 and within 180 days    Hemoglobin A1C  Date Value Ref Range Status  11/07/2021 7.4 (A) 4.0 - 5.6 % Final   Hgb A1c MFr Bld  Date Value Ref Range Status  03/19/2021 9.3 (H) <5.7 % of total Hgb Final    Comment:    For someone without known diabetes, a hemoglobin A1c value of 6.5% or greater indicates that they may have  diabetes and this should be confirmed with a follow-up  test. . For someone with known diabetes, a value <7% indicates  that their diabetes is well controlled and a value  greater than or equal to 7% indicates suboptimal  control. A1c targets should be individualized based on  duration of diabetes, age, comorbid conditions, and  other considerations. . Currently, no consensus exists regarding use of hemoglobin A1c for diagnosis of diabetes for children. .          Passed - eGFR in normal range and within 360 days    GFR, Est African American  Date Value Ref Range Status  03/15/2020 125 > OR = 60 mL/min/1.71m Final   GFR, Est Non African American  Date Value Ref Range Status  03/15/2020 108 > OR = 60 mL/min/1.737mFinal   eGFR  Date Value Ref Range Status  03/19/2021 112 > OR = 60 mL/min/1.7323minal    Comment:    The eGFR is based on the CKD-EPI 2021 equation. To calculate  the new eGFR from a previous Creatinine or Cystatin C result, go to https://www.kidney.org/professionals/ kdoqi/gfr%5Fcalculator          Passed - Valid encounter within  last 6 months    Recent Outpatient Visits          1 month ago Type 2 diabetes mellitus with other specified complication, with long-term current use of insulin ([place] [place]   5 months ago Type 2 diabetes mellitus with other specified complication, with long-term [facility] use of [facility] [place]   6 months ago Acute non-recurrent frontal sinusitis   [place]   8 months ago Annual physical exam   [place]ou[place]O   9 months ago History of nephrolithiasis   Sou[place]atonleDevonne DoughtyO      Future Appointments            In 3 months BraHollice EspyD BurWartraceIn 3 months KarParks RangerleDevonne DoughtyO [facility]

## 2021-12-10 NOTE — Telephone Encounter (Signed)
Requested Prescriptions  Pending Prescriptions Disp Refills  . B-D UF III MINI PEN NEEDLES 31G X 5 MM MISC [Pharmacy Med Name: B-D PEN NDL MINI 31GX5MM(3/16)PRPL] 100 each 3    Sig: USE ONCE DAILY AS DIRECTED     Endocrinology: Diabetes - Testing Supplies Passed - 12/08/2021  2:52 PM      Passed - Valid encounter within last 12 months    Recent Outpatient Visits          1 month ago Type 2 diabetes mellitus with other specified complication, with long-term current use of insulin ([place])   [place], DO   5 months ago Type 2 diabetes mellitus with other specified complication, with long-term current use of insulin [facility])   [place], Devonne Doughty, DO   6 months ago Acute non-recurrent frontal sinusitis   [place], DO   8 months ago Annual physical exam   [place], DO   9 months ago History of nephrolithiasis   [place], Devonne Doughty, DO      Future Appointments            In 3 months Hollice Espy, MD [place]   In 3 months Parks Ranger, Devonne Doughty, DO [facility], [place]           . TRESIBA FLEXTOUCH 100 UNIT/ML FlexTouch Pen [Pharmacy Med Name: TRESIBA FLEXTOUCH PEN (U-100)INJ3ML] 15 mL 0    Sig: ADMINISTER 20 UNITS UNDER THE SKIN DAILY     Endocrinology:  Diabetes - Insulins Passed - 12/08/2021  2:52 PM      Passed - HBA1C is between 0 and 7.9 and within 180 days    Hemoglobin A1C  Date Value Ref Range Status  11/07/2021 7.4 (A) 4.0 - 5.6 % Final   Hgb A1c MFr Bld  Date Value Ref Range Status  03/19/2021 9.3 (H) <5.7 % of total Hgb Final    Comment:    For someone without known diabetes, a hemoglobin A1c value of 6.5% or greater indicates that they may have  diabetes and this should be confirmed with a follow-up  test. . For someone with known  diabetes, a value <7% indicates  that their diabetes is well controlled and a value  greater than or equal to 7% indicates suboptimal  control. A1c targets should be individualized based on  duration of diabetes, age, comorbid conditions, and  other considerations. . Currently, no consensus exists regarding use of hemoglobin A1c for diagnosis of diabetes for children. Renella Cunas - Valid encounter within last 6 months    Recent Outpatient Visits          1 month ago Type 2 diabetes mellitus with other specified complication, with long-term current use of insulin [facility])   [place], DO   5 months ago Type 2 diabetes mellitus with other specified complication, with long-term current use of insulin [facility])   [place], DO   6 months ago Acute non-recurrent frontal sinusitis   [place], DO   8 months ago Annual physical exam   [place], DO   9 months ago History of nephrolithiasis   [place], Devonne Doughty, [place]  Future Appointments            In 3 months Hollice Espy, MD [place]   In 3 months Parks Ranger, Devonne Doughty, DO [facility], [facility]           . lisinopril (ZESTRIL) 2.5 MG tablet [Pharmacy Med Name: LISINOPRIL 2.'5MG'$  TABLETS] 90 tablet 1    Sig: TAKE 1 TABLET(2.5 MG) BY MOUTH DAILY     Cardiovascular:  ACE Inhibitors Failed - 12/08/2021  2:52 PM      Failed - Cr in normal range and within 180 days    Creat  Date Value Ref Range Status  09/05/2021 0.65 (L) 0.70 - 1.30 mg/dL Final         Passed - K in normal range and within 180 days    Potassium  Date Value Ref Range Status  09/05/2021 3.9 3.5 - 5.3 mmol/L Final         Passed - Patient is not pregnant      Passed - Last BP in normal range    BP Readings from Last  1 Encounters:  11/07/21 128/70         Passed - Valid encounter within last 6 months    Recent Outpatient Visits          1 month ago Type 2 diabetes mellitus with other specified complication, with long-term current use of insulin ([place])   [facility] Olin Hauser, DO   5 months ago Type 2 diabetes mellitus with other specified complication, with long-term current use of insulin [facility])   [place], Devonne Doughty, DO   6 months ago Acute non-recurrent frontal sinusitis   [place], DO   8 months ago Annual physical exam   [place], DO   9 months ago History of nephrolithiasis   [place], Devonne Doughty, DO      Future Appointments            In 3 months Hollice Espy, MD [place]   In 3 months Parks Ranger, Devonne Doughty, DO [facility], [place]           . OZEMPIC, 2 MG/DOSE, 8 MG/3ML SOPN [Pharmacy Med Name: OZEMPIC '2MG'$  PER DOSE (1X'8MG'$  PEN)] 9 mL 0    Sig: INJECT 2 MG INTO SKIN EVERY WEEK     Endocrinology:  Diabetes - GLP-1 Receptor Agonists - semaglutide Failed - 12/08/2021  2:52 PM      Failed - HBA1C in normal range and within 180 days    Hemoglobin A1C  Date Value Ref Range Status  11/07/2021 7.4 (A) 4.0 - 5.6 % Final   Hgb A1c MFr Bld  Date Value Ref Range Status  03/19/2021 9.3 (H) <5.7 % of total Hgb Final    Comment:    For someone without known diabetes, a hemoglobin A1c value of 6.5% or greater indicates that they may have  diabetes and this should be confirmed with a follow-up  test. . For someone with known diabetes, a value <7% indicates  that their diabetes is well controlled and a value  greater than or equal to 7% indicates suboptimal  control. A1c targets should be individualized based on  duration of diabetes, age, comorbid conditions, and   other considerations. . Currently, no consensus exists regarding use of hemoglobin A1c for diagnosis of diabetes for children. Marland Kitchen  Failed - Cr in normal range and within 360 days    Creat  Date Value Ref Range Status  09/05/2021 0.65 (L) 0.70 - 1.30 mg/dL Final         Passed - Valid encounter within last 6 months    Recent Outpatient Visits          1 month ago Type 2 diabetes mellitus with other specified complication, with long-term current use of insulin [facility])   [place], DO   5 months ago Type 2 diabetes mellitus with other specified complication, with long-term current use of insulin [facility])   [place], DO   6 months ago Acute non-recurrent frontal sinusitis   [place], DO   8 months ago Annual physical exam   [place], DO   9 months ago History of nephrolithiasis   [place], Devonne Doughty, DO      Future Appointments            In 3 months Hollice Espy, MD [place]   In 3 months Parks Ranger, Devonne Doughty, [facility], [facility]

## 2022-02-19 ENCOUNTER — Other Ambulatory Visit: Payer: Self-pay | Admitting: Pharmacist

## 2022-02-19 ENCOUNTER — Ambulatory Visit: Payer: BC Managed Care – PPO | Admitting: Pharmacist

## 2022-02-19 DIAGNOSIS — B360 Pityriasis versicolor: Secondary | ICD-10-CM

## 2022-02-19 DIAGNOSIS — E1169 Type 2 diabetes mellitus with other specified complication: Secondary | ICD-10-CM

## 2022-02-19 MED ORDER — DESONIDE 0.05 % EX CREA: TOPICAL_CREAM | Freq: Every day | CUTANEOUS | 2 refills | Status: DC | PRN

## 2022-02-19 NOTE — Patient Instructions (Signed)
Goals Addressed             This Visit's Progress    Pharmacy Goals       Our goal A1c is less than 7%. This corresponds with fasting sugars less than 130 and 2 hour after meal sugars less than 180. Please check your blood sugar and keep a log of the results  Our goal bad cholesterol, or LDL, is less than 70 . This is why it is important to continue taking your atorvastatin.  Feel free to call me with any questions or concerns. I look forward to our next call!  Harun Brumley Paxton Kanaan, PharmD, BCACP, CPP Clinical Pharmacist [facility] [place] [phone]        

## 2022-02-19 NOTE — Chronic Care Management (AMB) (Signed)
Chief Complaint  Patient presents with   Care Coordination    Type 2 Diabetes    Miguel [name] is a 51 y.o. year old male who presented for a telephone visit.   They were referred to the pharmacist by their PCP for assistance in managing diabetes.    Subjective:  Care Team: Primary Care Provider: Olin Hauser, DO ; Next Scheduled Visit: 03/27/2022 Pain Management: Dr. Luberta [facility]; Next Scheduled Visit: 05/07/2022 Neurology: [facility] Urology: Hollice Espy, MD [place]; Next Scheduled Visit: 03/19/2022  Medication Access/Adherence  Current Pharmacy:  [facility] [place], [place] [address] [place] [address] Phone: [phone] Fax: [phone]  [place], [place] [address] [place] [address] Phone: [phone] Fax: [phone]   Patient reports affordability concerns with their medications: No  Patient reports access/transportation concerns to their pharmacy: No  Patient reports adherence concerns with their medications:  Reports recently missed a couple of doses of Jardiance, but working on adjusting his routine to avoid missing doses   Type 2 Diabetes:  Current medications:  Ozempic 2 mg weekly on Sundays  Tresiba FlexTouch 12 or 14 units daily in evening Jardiance 25 mg daily with breakfast  Medications tried in the past: metformin (IR GI intolerance/felt ER at tolerated dose was ineffective) Reports missed Jardiance twice in past 2 weeks, but adjusting his routine to avoid missing doses  Note blood sugar control impacted by receiving steroid infusions and injections: Receives steroid injections ~every 3 months for pain management and steroid infusions every 6 months with multiple sclerosis treatment (Ocrelizumab infusion) Reports last steroid injection from pain  management received on 9/28  Results from past 14-days for Dexcom G6 Continuous Glucose Meter (CGM) from Dexcom Clarity system: Date of Download: 02/19/22 % Time CGM is active: 91.3% Average Glucose: 197 mg/dL Glucose Management Indicator: 8.0%  Glucose Variability: 16.8% (goal <36%) Time in Goal:  - Time in range 70-180: 35% - Time above range: 65% - Time below range: 0% Observed patterns: Patient having elevated blood sugar readings, particularly high in evenings ~7 pm until 12 am   Patient attributes recent elevation in blood sugar readings to both recent steroid injection received from pain management and to recent difficulty with maintaining positive eating habits while his family has been under more stress (spouse recently in the hospital)  Reports planning to get back to more positive eating habits/reducing carbohydrate intake and continues to use CGM as tool to reinforce/aid with dietary choices and improve blood sugar control  Current physical activity: Recently completed 8 weeks of physical therapy twice weekly for hernia/hip/knee, now plans to get back to walking most mornings and throughout the day at at work/home  Reports continues to work on weight loss. Current weight ~223-226 lbs Statin therapy: atorvastatin 20 mg daily Patient denies hypoglycemic s/sx including dizziness, shakiness, sweating.  Confirms carries glucose tablets in case of symptoms of hypoglycemia Discuss future option of switching from Ozempic to [facility] for potential greater weight loss benefit/A1C lowering Patient interested in making this change in the future if Moujaro covered through his insurance From review of prescription formulary today from [place] website, Wynonia Hazard currently not covered   Health Maintenance  Health Maintenance Due  Topic Date Due   Diabetic kidney evaluation - Urine ACR  Never  done   COLONOSCOPY (Pts 45-70yr Insurance coverage will need to be confirmed)   Never done   COVID-19 Vaccine (3 - Pfizer series) 11/22/2019   Zoster Vaccines- Shingrix (1 of 2) Never done   INFLUENZA VACCINE  12/17/2021     Objective: Lab Results  Component Value Date   HGBA1C 7.4 (A) 11/07/2021    Lab Results  Component Value Date   CREATININE 0.65 (L) 09/05/2021   BUN 17 09/05/2021   NA 141 09/05/2021   K 3.9 09/05/2021   CL 106 09/05/2021   CO2 23 09/05/2021    Lab Results  Component Value Date   CHOL 158 03/19/2021   HDL 34 (L) 03/19/2021   LDLCALC 91 03/19/2021   TRIG 237 (H) 03/19/2021   CHOLHDL 4.6 03/19/2021    Medications Reviewed Today     Reviewed by DRennis Petty RPH-CPP (Pharmacist) on 02/19/22 at 1449  Med List Status: <None>   Medication Order Taking? Sig Documenting Provider Last Dose Status Informant  atorvastatin (LIPITOR) 20 MG tablet [id number] TAKE 1 TABLET(20 MG) BY MOUTH AT BEDTIME KOlin Hauser DO Taking Active   B-D UF III MINI PEN NEEDLES 31G X 5 MM MISC [id number] USE ONCE DAILY AS DIRECTED KParks Ranger ADevonne Doughty DO  Active   cetirizine (ZYRTEC) 10 MG tablet [id number] Take 10 mg by mouth daily as needed for allergies. [provider]  Active   Cholecalciferol (D 1000) 1000 units capsule [id number] Take by mouth.  [provider]  Active            Med Note (Barnet Pall JAMIE A   Thu May 10, 2015  9:44 AM) [facility] Continuous Blood Gluc Sensor (DEXCOM G6 SENSOR) [id number] Use as directed KOlin Hauser DO  Active   Continuous Blood Gluc Transmit (DEXCOM G6 TRANSMITTER) MISC [id number] USE AS DIRECTED KOlin Hauser DO  Active   Cyanocobalamin (VITAMIN B-12) 5000 MCG SUBL [id number] Place under the tongue. [provider]  Active            Med Note (Barnet Pall JAMIE A   Thu May 10, 2015  9:44 AM) [facility] desonide (DESOWEN) 0.05 % cream [id number] Apply topically daily as needed. Karamalegos, ADevonne Doughty DO  Active   empagliflozin (JARDIANCE) 25 MG TABS tablet [id number] Take 1 tablet (25 mg total) by mouth daily before breakfast. KOlin Hauser DO Taking Active   gabapentin (NEURONTIN) 300 MG capsule [id number] Take 300 mg by mouth continuous as needed.  [provider]  Active            Med Note (Barnet Pall JAMIE A   Thu May 10, 2015  9:44 AM) [facility] ibuprofen (ADVIL,MOTRIN) 800 MG tablet [id number] Take 800 mg by mouth every 6 (six) hours as needed. [provider]  Active            Med Note (Barnet Pall JAMIE A   Thu May 10, 2015  9:44 AM) [facility] ipratropium (ATROVENT) 0.06 % nasal spray [id number] USE 2 SPRAYS IN EACH NOSTRIL FOUR TIMES DAILY FOR UP TO 5 TO 7 DAYS THEN STOP Karamalegos, ADevonne Doughty DO  Active   ketoconazole (NIZORAL) 2 % shampoo [id number] Apply 1 application topically daily as needed. KOlin Hauser DO  Active   Lancets ([facility]) lancets [id number]  Check blood sugar three times daily. Olin Hauser, DO  Active   lisinopril (ZESTRIL) 2.5 MG tablet [id number]  TAKE 1 TABLET(2.5 MG) BY MOUTH DAILY Parks Ranger, Devonne Doughty, DO  Active   Multiple Vitamins-Minerals (PRESERVISION AREDS PO) [id number]  Take by mouth. [provider]  Active   Ocrelizumab (OCREVUS IV) [id number]  Inject into the vein. [provider]  Active   [facility] ULTRA test strip [id number]  USE TO CHECK BLOOD SUGAR THREE TIMES DAILY AS DIRECTED Olin Hauser, DO  Active   OZEMPIC, 2 MG/DOSE, 8 MG/3ML SOPN [id number] Yes INJECT 2 MG INTO SKIN EVERY WEEK Olin Hauser, DO Taking Active   tamsulosin (FLOMAX) 0.4 MG CAPS capsule [id number]  TAKE ONE CAPSULE BY MOUTH DAILY AS NEEDED FOR KIDNEY STONE FOR 7 TO 14 DAYS Karamalegos, Devonne Doughty, DO  Active   tiZANidine (ZANAFLEX) 4 MG tablet [id number]  TAKE 1/2 TO 1 TABLET(2 TO 4 MG) BY MOUTH EVERY 8 HOURS AS NEEDED FOR  MUSCLE SPASMS Karamalegos, Devonne Doughty, DO  Active   traMADol (ULTRAM) 50 MG tablet [id number]  Take 50 mg by mouth daily as needed. [provider]  Active   TRESIBA FLEXTOUCH 100 UNIT/ML FlexTouch Pen [id number] Yes ADMINISTER 20 UNITS UNDER THE SKIN DAILY  Patient taking differently: 14 Units daily.   Olin Hauser, DO Taking Active               Assessment/Plan:   Diabetes: - Reviewed long term cardiovascular and renal outcomes of uncontrolled blood sugar - Reviewed goal A1c, goal fasting, and goal 2 hour post prandial glucose - Reviewed dietary modifications including having regular well-balanced meals throughout the day and controlling carbohydrate portion sizes - Recommend to continue to use CGM as tool to reinforce/aid with dietary choices and improve blood sugar control  - Patient to continue taking Ozempic 2 mg weekly, Jardiance 25 mg daily and the Tresiba 14 units daily   Follow Up Plan: Clinic Pharmacist will follow up with patient by telephone on 05/26/2022 at 9:15 am  Wallace Cullens, PharmD, [place], [facility] [phone]

## 2022-02-19 NOTE — Telephone Encounter (Signed)
Patient requesting renewal of desonide cream Rx. Would you please consider sending new Rx to [place] for patient?  Thank you! Grayland Ormond

## 2022-03-14 ENCOUNTER — Other Ambulatory Visit: Payer: Self-pay | Admitting: Family Medicine

## 2022-03-14 DIAGNOSIS — Z794 Long term (current) use of insulin: Secondary | ICD-10-CM

## 2022-03-14 NOTE — Telephone Encounter (Signed)
Requested Prescriptions  Pending Prescriptions Disp Refills  . OZEMPIC, 2 MG/DOSE, 8 MG/3ML SOPN [Pharmacy Med Name: OZEMPIC '2MG'$  PER DOSE (1X'8MG'$  PEN)] 9 mL 1    Sig: INJECT 2 MG UNDER THE SKIN ONE DAY A WEEK     Endocrinology:  Diabetes - GLP-1 Receptor Agonists - semaglutide Failed - 03/14/2022 12:25 PM      Failed - HBA1C in normal range and within 180 days    Hemoglobin A1C  Date Value Ref Range Status  11/07/2021 7.4 (A) 4.0 - 5.6 % Final   Hgb A1c MFr Bld  Date Value Ref Range Status  03/19/2021 9.3 (H) <5.7 % of total Hgb Final    Comment:    For someone without known diabetes, a hemoglobin A1c value of 6.5% or greater indicates that they may have  diabetes and this should be confirmed with a follow-up  test. . For someone with known diabetes, a value <7% indicates  that their diabetes is well controlled and a value  greater than or equal to 7% indicates suboptimal  control. A1c targets should be individualized based on  duration of diabetes, age, comorbid conditions, and  other considerations. . Currently, no consensus exists regarding use of hemoglobin A1c for diagnosis of diabetes for children. .          Failed - Cr in normal range and within 360 days    Creat  Date Value Ref Range Status  09/05/2021 0.65 (L) 0.70 - 1.30 mg/dL Final         Passed - Valid encounter within last 6 months    Recent Outpatient Visits          3 weeks ago Type 2 diabetes mellitus with other specified complication, with long-term current use of insulin ([place])   [place], Grayland Ormond A, RPH-CPP   4 months ago Type 2 diabetes mellitus with other specified complication, with long-term current use of insulin ([place])   [place], DO   8 months ago Type 2 diabetes mellitus with other specified complication, with long-term current use of insulin [facility])   [place], DO   9 months ago  Acute non-recurrent frontal sinusitis   [facility] Olin Hauser, DO   11 months ago Annual physical exam   [place], Devonne Doughty, DO      Future Appointments            In 1 week Parks Ranger, Devonne Doughty, [facility], [place]   In 2 weeks Hollice Espy, [place]

## 2022-03-19 ENCOUNTER — Ambulatory Visit: Payer: BC Managed Care – PPO | Admitting: Urology

## 2022-03-21 ENCOUNTER — Other Ambulatory Visit: Payer: Self-pay

## 2022-03-21 DIAGNOSIS — Z125 Encounter for screening for malignant neoplasm of prostate: Secondary | ICD-10-CM

## 2022-03-21 DIAGNOSIS — G35 Multiple sclerosis: Secondary | ICD-10-CM

## 2022-03-21 DIAGNOSIS — Z0000 Encounter for general adult medical examination without abnormal findings: Secondary | ICD-10-CM

## 2022-03-21 DIAGNOSIS — E1169 Type 2 diabetes mellitus with other specified complication: Secondary | ICD-10-CM

## 2022-03-24 ENCOUNTER — Other Ambulatory Visit: Payer: BC Managed Care – PPO

## 2022-03-25 LAB — CBC WITH DIFFERENTIAL/PLATELET
Absolute Monocytes: 479 cells/uL (ref 200–950)
Basophils Absolute: 22 cells/uL (ref 0–200)
Basophils Relative: 0.4 %
Eosinophils Absolute: 99 cells/uL (ref 15–500)
Eosinophils Relative: 1.8 %
HCT: 51.3 % — ABNORMAL HIGH (ref 38.5–50.0)
Hemoglobin: 17.9 g/dL — ABNORMAL HIGH (ref 13.2–17.1)
Lymphs Abs: 1183 cells/uL (ref 850–3900)
MCH: 31.9 pg (ref 27.0–33.0)
MCHC: 34.9 g/dL (ref 32.0–36.0)
MCV: 91.4 fL (ref 80.0–100.0)
MPV: 10.7 fL (ref 7.5–12.5)
Monocytes Relative: 8.7 %
Neutro Abs: 3718 cells/uL (ref 1500–7800)
Neutrophils Relative %: 67.6 %
Platelets: 200 10*3/uL (ref 140–400)
RBC: 5.61 10*6/uL (ref 4.20–5.80)
RDW: 11.9 % (ref 11.0–15.0)
Total Lymphocyte: 21.5 %
WBC: 5.5 10*3/uL (ref 3.8–10.8)

## 2022-03-25 LAB — COMPLETE METABOLIC PANEL WITH GFR
AG Ratio: 2.4 (calc) (ref 1.0–2.5)
ALT: 27 U/L (ref 9–46)
AST: 19 U/L (ref 10–35)
Albumin: 4.8 g/dL (ref 3.6–5.1)
Alkaline phosphatase (APISO): 87 U/L (ref 35–144)
BUN: 11 mg/dL (ref 7–25)
CO2: 26 mmol/L (ref 20–32)
Calcium: 9.5 mg/dL (ref 8.6–10.3)
Chloride: 103 mmol/L (ref 98–110)
Creat: 0.76 mg/dL (ref 0.70–1.30)
Globulin: 2 g/dL (calc) (ref 1.9–3.7)
Glucose, Bld: 149 mg/dL — ABNORMAL HIGH (ref 65–99)
Potassium: 4.3 mmol/L (ref 3.5–5.3)
Sodium: 139 mmol/L (ref 135–146)
Total Bilirubin: 0.8 mg/dL (ref 0.2–1.2)
Total Protein: 6.8 g/dL (ref 6.1–8.1)
eGFR: 109 mL/min/{1.73_m2} (ref >=60)

## 2022-03-25 LAB — LIPID PANEL
Cholesterol: 138 mg/dL (ref <200)
HDL: 32 mg/dL — ABNORMAL LOW (ref >=40)
LDL Cholesterol (Calc): 77 mg/dL (calc)
Non-HDL Cholesterol (Calc): 106 mg/dL (calc) (ref <130)
Total CHOL/HDL Ratio: 4.3 (calc) (ref <5.0)
Triglycerides: 192 mg/dL — ABNORMAL HIGH (ref <150)

## 2022-03-25 LAB — HEMOGLOBIN A1C
Hgb A1c MFr Bld: 7.5 % of total Hgb — ABNORMAL HIGH (ref <5.7)
Mean Plasma Glucose: 169 mg/dL
eAG (mmol/L): 9.3 mmol/L

## 2022-03-25 LAB — TSH: TSH: 1.31 mIU/L (ref 0.40–4.50)

## 2022-03-25 LAB — PSA: PSA: 0.46 ng/mL (ref <=4.00)

## 2022-03-26 ENCOUNTER — Other Ambulatory Visit: Payer: Self-pay

## 2022-03-26 DIAGNOSIS — N200 Calculus of kidney: Secondary | ICD-10-CM

## 2022-03-26 IMAGING — CR DG ABDOMEN 1V
2 series · 2 of 2 positions shown · non-contrast
Comparison: 04/29/2013

CLINICAL DATA: Renal stones.

EXAM:
ABDOMEN - 1 VIEW

[abdomen kub (1 of 2)]
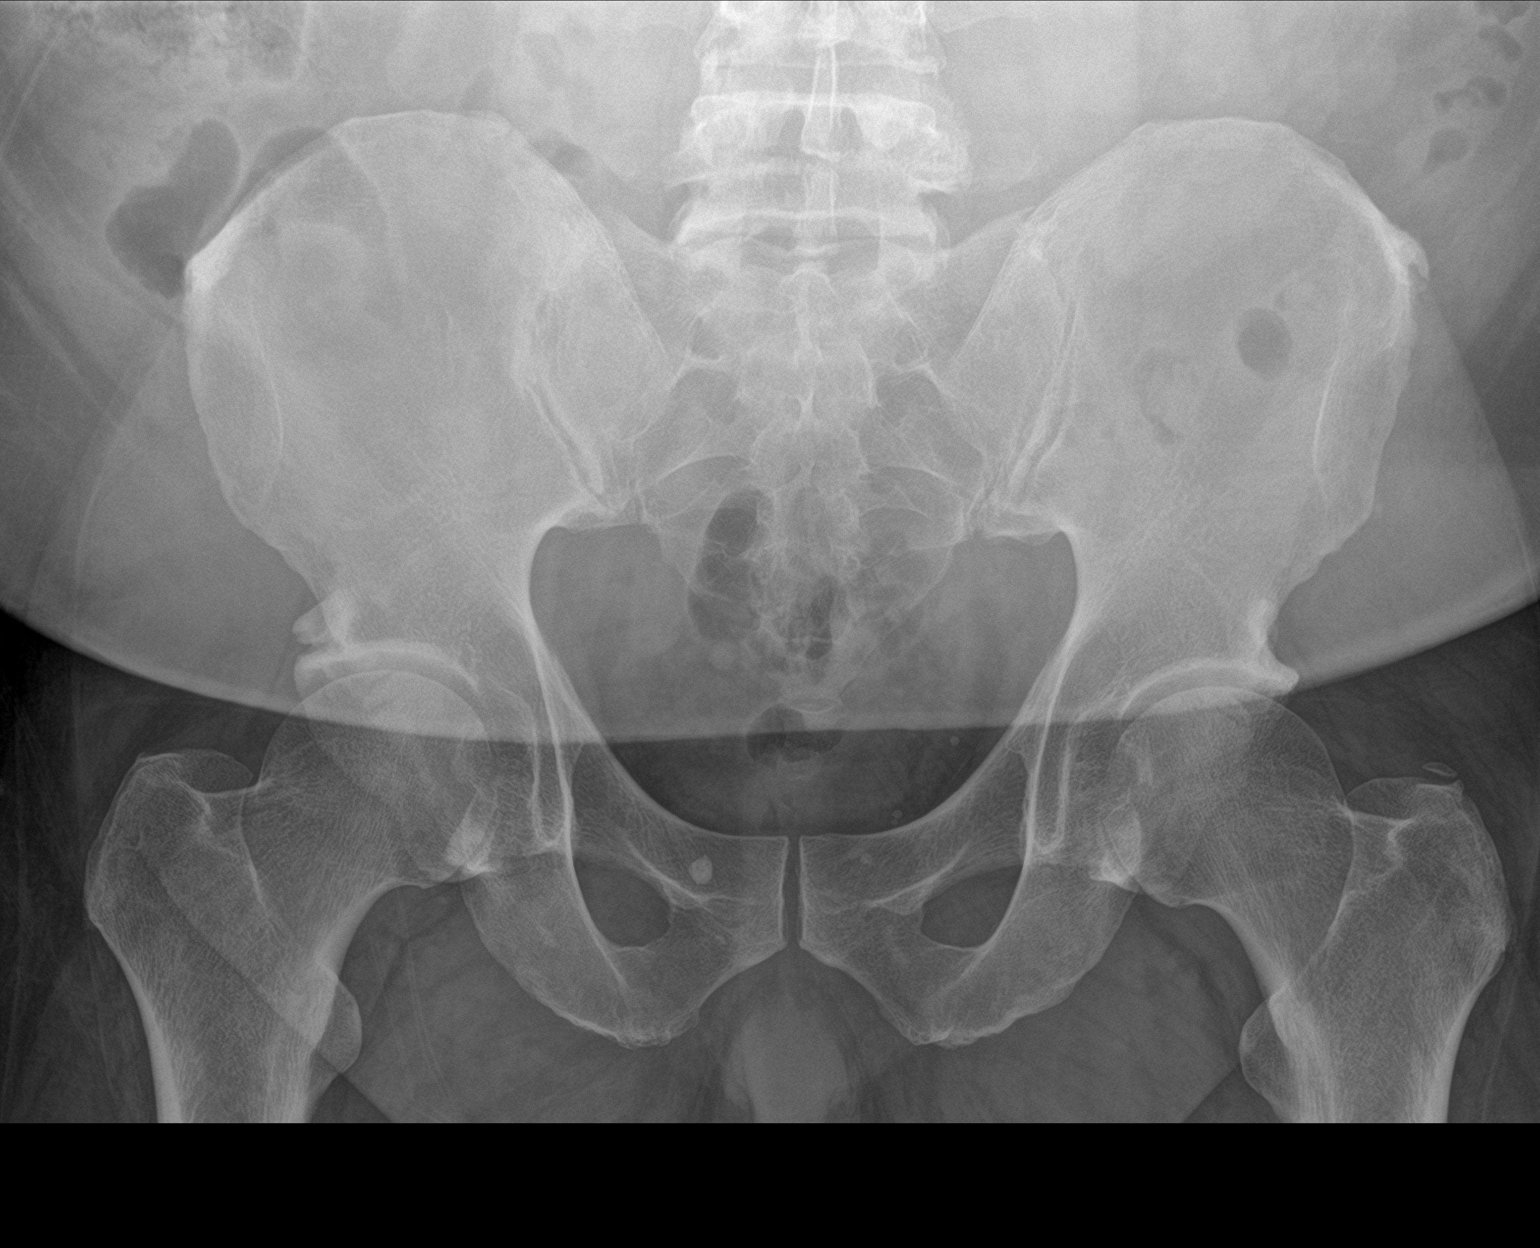

[abdomen kub (2 of 2)]
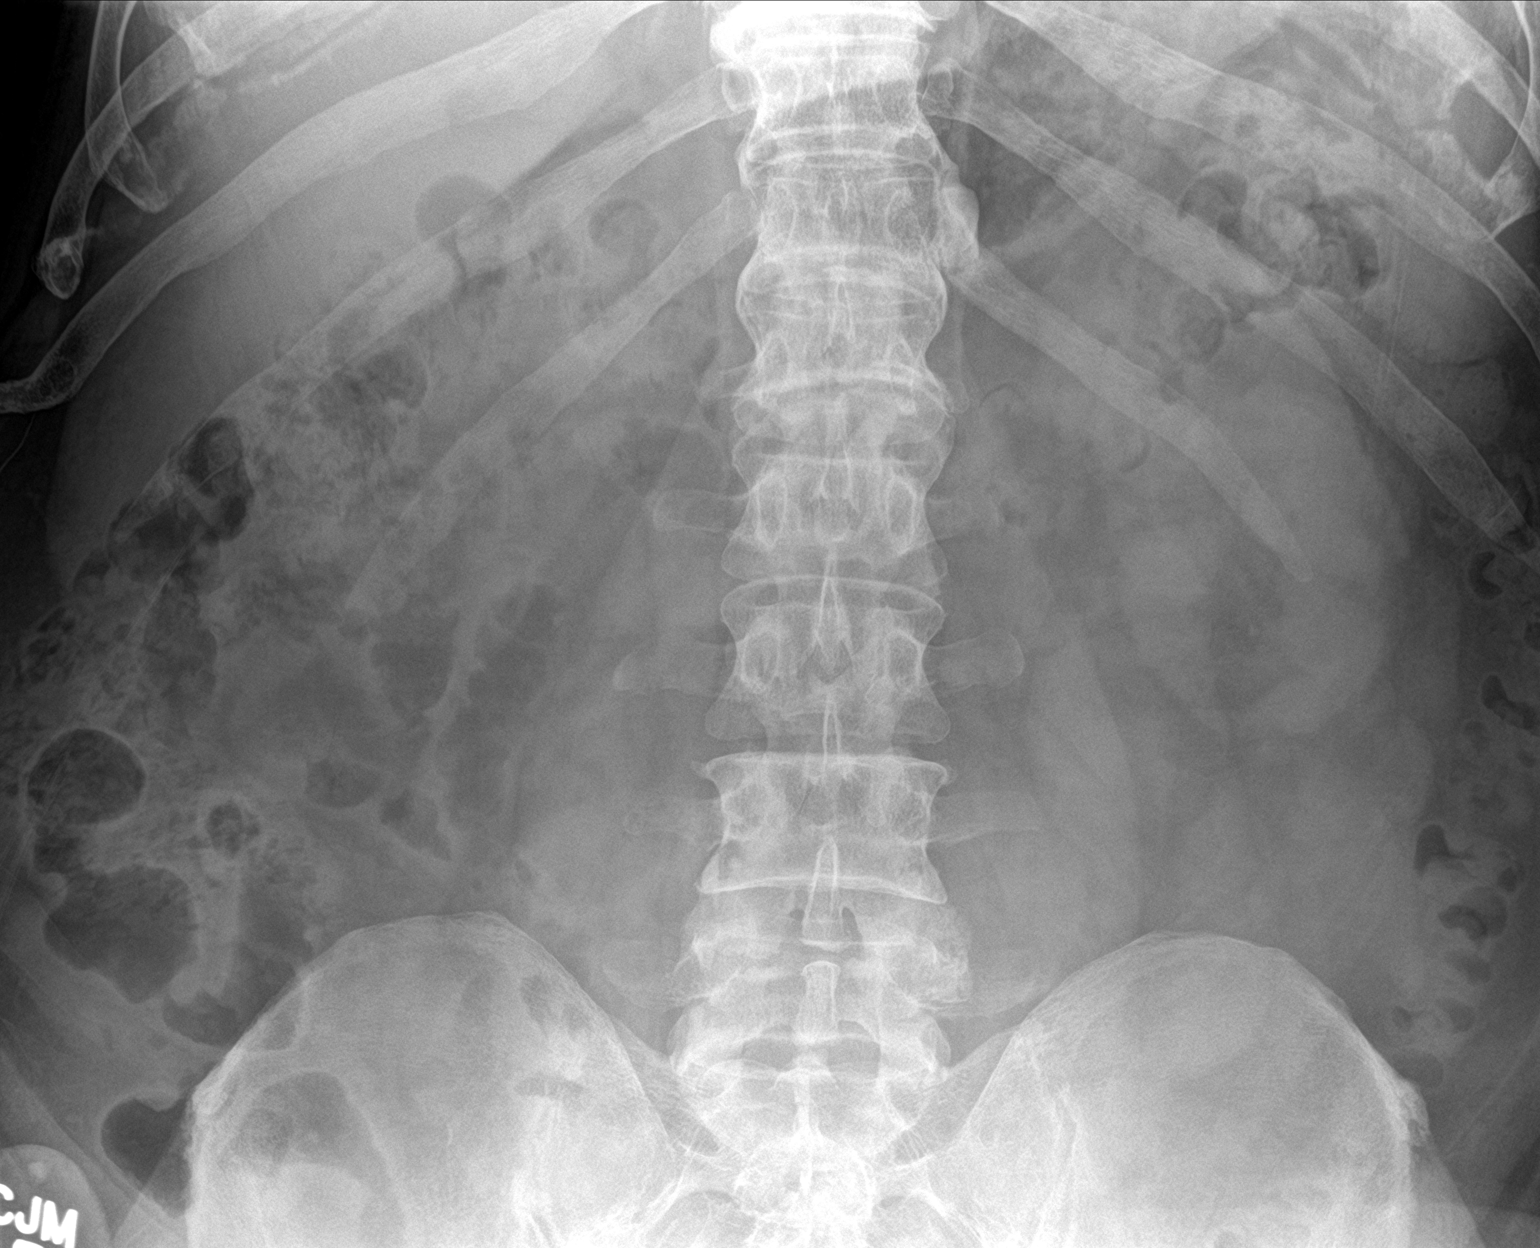

[2 of 2 positions shown; findings below may reference images not displayed]

FINDINGS: Bowel gas pattern is nonspecific. Kidneys are partly obscured by
bowel contents. There is faint 3 mm calcific density overlying the
lower pole of left kidney. Phleboliths are seen in the pelvis. No
abnormal masses are seen.
IMPRESSION: Nonspecific bowel gas pattern. Possible 3 mm calculus in the lower
pole of left kidney. Kidneys are partly obscured by bowel contents
limiting evaluation for renal stones.

## 2022-03-27 ENCOUNTER — Encounter: Payer: Self-pay | Admitting: Family Medicine

## 2022-03-27 ENCOUNTER — Ambulatory Visit (INDEPENDENT_AMBULATORY_CARE_PROVIDER_SITE_OTHER): Payer: BC Managed Care – PPO | Admitting: Family Medicine

## 2022-03-27 VITALS — BP 123/69 | HR 84 | Ht 68.0 in | Wt 228.6 lb

## 2022-03-27 DIAGNOSIS — G35 Multiple sclerosis: Secondary | ICD-10-CM

## 2022-03-27 DIAGNOSIS — E1169 Type 2 diabetes mellitus with other specified complication: Secondary | ICD-10-CM

## 2022-03-27 DIAGNOSIS — G4733 Obstructive sleep apnea (adult) (pediatric): Secondary | ICD-10-CM

## 2022-03-27 DIAGNOSIS — Z0000 Encounter for general adult medical examination without abnormal findings: Secondary | ICD-10-CM

## 2022-03-27 DIAGNOSIS — Z23 Encounter for immunization: Secondary | ICD-10-CM | POA: Diagnosis not present

## 2022-03-27 DIAGNOSIS — M62830 Muscle spasm of back: Secondary | ICD-10-CM

## 2022-03-27 DIAGNOSIS — M47814 Spondylosis without myelopathy or radiculopathy, thoracic region: Secondary | ICD-10-CM

## 2022-03-27 DIAGNOSIS — Z794 Long term (current) use of insulin: Secondary | ICD-10-CM

## 2022-03-27 DIAGNOSIS — E785 Hyperlipidemia, unspecified: Secondary | ICD-10-CM

## 2022-03-27 MED ORDER — DEXCOM G6 SENSOR MISC: 12 refills | Status: DC

## 2022-03-27 MED ORDER — TIZANIDINE HCL 4 MG PO TABS: ORAL_TABLET | ORAL | 3 refills | Status: DC

## 2022-03-27 MED ORDER — DEXCOM G6 TRANSMITTER MISC: 3 refills | Status: DC

## 2022-03-27 MED ORDER — OZEMPIC (1 MG/DOSE) 4 MG/3ML ~~LOC~~ SOPN: 1.0000 mg | PEN_INJECTOR | SUBCUTANEOUS | 0 refills | Status: DC

## 2022-03-27 NOTE — Assessment & Plan Note (Signed)
A1c 7.5 stable CGM has improved his control overall Steroid courses PRN for MS No evidence of hypoglycemia on GLP and Insulin Failed Metformin '1000mg'$  BID (GI intolerance), XR was ineffective Complications - other including dyslipidemia with low HDL and history of hypertriglyceridemia, obesity, OSA - increases risk of future cardiovascular complications / poor glucose control due to OSA  Weight down significantly  Plan:  1. Continue Ozempic '2mg'$  weekly inj - if out then can try Ozempic '1mg'$  as back up plan while on back order 2. Continue jardiance '25mg'$  daily 3. Continue insulin Tresiba 12-14 units daily, has lowered and continues to adjust 4. Encourage improved lifestyle - low carb, low sugar diet, reduce portion size, continue improving regular exercise 5. Check CBG, bring log to next visit for review again 6. Continue Statin, ACE  Considering Future Mounjaro, if covered in 2024

## 2022-03-27 NOTE — Progress Notes (Signed)
Subjective:    Patient ID: Miguel [name], male    DOB: 02/10/1971, 51 y.o.   MRN: [id number]  Miguel [name] is a 51 y.o. male presenting on 03/27/2022 for Annual Exam   HPI  Here for Annual Physical and Lab Review  FOLLOW-UP CHRONIC DM, Type 2: See prior notes for background information.  Dexcom G7 CGM reviewed data, Avg currently 147, last 1 week 83% in range, significantly improved with in target range overall now improved Previously some higher sugars with abnormal diet, wife has had some health issues lately, some convenience. 2 back to back steroid courses for his MS. Continues Ozempic 80m weekly inj, Jardiance 249mdaily Insulin Tresiba down to 12-14 units - Remains OFF Metformin Future consideration of Mounjaro Currently on ACEi Lifestyle: Weight down 10 lbs in 7 months Improving Diet. - Exercise - back in gym at therapy 2 x week - Last DM Eye 2023 already 09/2021 Denies hypoglycemia  Back up Ozempic 72m72mrdered in case on back order still  HYPERLIPIDEMIA: Last lipid panel 03/2022, controlled LDL 77, TG improved < 200 - Currently taking Atorvastatin 18m15molerating well without side effects or myalgias   Chronic Neuropathic Pain / Back Pain / Multiple Sclerosis [facility] See outside record for details. Continues on disease modifying therapy infusion every few months, rarely has flare up requiring bag steroid infusion. - Taking Gabapentin, Tizanidine, Tramadol - temporary relief - Has been receiving spinal injections as well - Significant improvement still on Tizanidine 2-4mg 86mage PRN with great results to help pain. Takes it early to prevent. Has rarely taken Baclofen PRN for spasms.  Health Maintenance: Flu Shot today  Future Shingrix  Considering Colon CA Screen in 2024. Maybe cologuard. He will let us kn[place].     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  Borderline but uses CPAP   Peripheral neuropathic pain 02/1246/82/9562nal colic 04/2212/08/6578oulder pain 07/23/2015   Sleep apnea    Past Surgical History:  Procedure Laterality Date   HERNIA REPAIR     URETEROSCOPY WITH HOLMIUM LASER LITHOTRIPSY     x 2   Social History   Socioeconomic History  Marital status: Married    Spouse name: Not on file   Number of children: Not on file   Years of education: Not on file   Highest education level: Not on file  Occupational History   Not on file  Tobacco Use   Smoking status: Never   Smokeless tobacco: Never  Substance and Sexual Activity   Alcohol use: No   Drug use: No   Sexual activity: Not on file  Other Topics Concern   Not on file  Social History Narrative   Not on file   Social Determinants of Health   Financial Resource Strain: Not on file  Food Insecurity: Not on file  Transportation Needs: Not on file  Physical Activity: Not on file   Stress: Not on file  Social Connections: Not on file  Intimate Partner Violence: Not on file   Family History  Problem Relation Age of Onset   Colon cancer Neg Hx    Prostate cancer Neg Hx    Bladder Cancer Neg Hx    Kidney cancer Neg Hx    Current Outpatient Medications on File Prior to Visit  Medication Sig   atorvastatin (LIPITOR) 20 MG tablet TAKE 1 TABLET(20 MG) BY MOUTH AT BEDTIME   B-D UF III MINI PEN NEEDLES 31G X 5 MM MISC USE ONCE DAILY AS DIRECTED   cetirizine (ZYRTEC) 10 MG tablet Take 10 mg by mouth daily as needed for allergies.   Cholecalciferol (D 1000) 1000 units capsule Take by mouth.    Cyanocobalamin (VITAMIN B-12) 5000 MCG SUBL Place under the tongue.   desonide (DESOWEN) 0.05 % cream Apply topically daily as needed.   empagliflozin (JARDIANCE) 25 MG TABS tablet Take 1 tablet (25 mg total) by mouth daily before breakfast.   gabapentin (NEURONTIN) 300 MG capsule Take 300 mg by mouth continuous as needed.    ibuprofen (ADVIL,MOTRIN) 800 MG tablet Take 800 mg by mouth every 6 (six) hours as needed.   ipratropium (ATROVENT) 0.06 % nasal spray USE 2 SPRAYS IN EACH NOSTRIL FOUR TIMES DAILY FOR UP TO 5 TO 7 DAYS THEN STOP   ketoconazole (NIZORAL) 2 % shampoo Apply 1 application topically daily as needed.   Lancets (ONETOUCH ULTRASOFT) lancets Check blood sugar three times daily.   lisinopril (ZESTRIL) 2.5 MG tablet TAKE 1 TABLET(2.5 MG) BY MOUTH DAILY   Multiple Vitamins-Minerals (PRESERVISION AREDS PO) Take by mouth.   Ocrelizumab (OCREVUS IV) Inject into the vein.   ONETOUCH ULTRA test strip USE TO CHECK BLOOD SUGAR THREE TIMES DAILY AS DIRECTED   OZEMPIC, 2 MG/DOSE, 8 MG/3ML SOPN INJECT 2 MG UNDER THE SKIN ONE DAY A WEEK   tamsulosin (FLOMAX) 0.4 MG CAPS capsule TAKE ONE CAPSULE BY MOUTH DAILY AS NEEDED FOR KIDNEY STONE FOR 7 TO 14 DAYS   traMADol (ULTRAM) 50 MG tablet Take 50 mg by mouth daily as needed.   TRESIBA FLEXTOUCH 100 UNIT/ML FlexTouch Pen ADMINISTER 20  UNITS UNDER THE SKIN DAILY (Patient taking differently: 14 Units daily.)   No current facility-administered medications on file prior to visit.    Review of Systems Per HPI unless specifically indicated above     Objective:    BP 123/69   Pulse 84   Ht _0  (1.727 m)   Wt 228 lb 9.6 oz (103.7 kg)   SpO2 100%   BMI 34.76 kg/m   Wt Readings from Last 3 Encounters:  03/27/22 228 lb 9.6 oz (103.7 kg)  11/07/21 228 lb (103.4  kg)  07/08/21 238 lb 3.2 oz (108 kg)    Physical Exam Vitals and nursing note reviewed.  Constitutional:      General: He is not in acute distress.    Appearance: He is well-developed. He is not diaphoretic.     Comments: Well-appearing, comfortable, cooperative  HENT:     Head: Normocephalic and atraumatic.  Eyes:     General:        Right eye: No discharge.        Left eye: No discharge.     Conjunctiva/sclera: Conjunctivae normal.  Neck:     Thyroid: No thyromegaly.  Cardiovascular:     Rate and Rhythm: Normal rate and regular rhythm.     Pulses: Normal pulses.     Heart sounds: Normal heart sounds. No murmur heard. Pulmonary:     Effort: Pulmonary effort is normal. No respiratory distress.     Breath sounds: Normal breath sounds. No wheezing or rales.  Musculoskeletal:        General: Normal range of motion.     Cervical back: Normal range of motion and neck supple.  Lymphadenopathy:     Cervical: No cervical adenopathy.  Skin:    General: Skin is warm and dry.     Findings: No erythema or rash.  Neurological:     Mental Status: He is alert and oriented to person, place, and time. Mental status is at baseline.  Psychiatric:        Behavior: Behavior normal.     Comments: Well groomed, good eye contact, normal speech and thoughts      Recent Labs    07/08/21 0819 11/07/21 0856 03/24/22 0818  HGBA1C 8.6* 7.4* 7.5*     Diabetic Foot Exam - Simple   Simple Foot Form Diabetic Foot exam was performed with the following findings:  Yes 03/27/2022  8:35 AM  Visual Inspection See comments: Yes Sensation Testing Intact to touch and monofilament testing bilaterally: Yes Pulse Check Posterior Tibialis and Dorsalis pulse intact bilaterally: Yes Comments Bilateral feet with callus formation, some nail pitting.      Results for orders placed or performed in visit on 03/21/22  TSH  Result Value Ref Range   TSH 1.31 0.40 - 4.50 mIU/L  PSA  Result Value Ref Range   PSA 0.46 < OR = 4.00 ng/mL  Hemoglobin A1c  Result Value Ref Range   Hgb A1c MFr Bld 7.5 (H) <5.7 % of total Hgb   Mean Plasma Glucose 169 mg/dL   eAG (mmol/L) 9.3 mmol/L  Lipid panel  Result Value Ref Range   Cholesterol 138 <200 mg/dL   HDL 32 (L) > OR = 40 mg/dL   Triglycerides 192 (H) <150 mg/dL   LDL Cholesterol (Calc) 77 mg/dL (calc)   Total CHOL/HDL Ratio 4.3 <5.0 (calc)   Non-HDL Cholesterol (Calc) 106 <130 mg/dL (calc)  COMPLETE METABOLIC PANEL WITH GFR  Result Value Ref Range   Glucose, Bld 149 (H) 65 - 99 mg/dL   BUN 11 7 - 25 mg/dL   Creat 0.76 0.70 - 1.30 mg/dL   eGFR 109 > OR = 60 mL/min/1.16m   BUN/Creatinine Ratio SEE NOTE: 6 - 22 (calc)   Sodium 139 135 - 146 mmol/L   Potassium 4.3 3.5 - 5.3 mmol/L   Chloride 103 98 - 110 mmol/L   CO2 26 20 - 32 mmol/L   Calcium 9.5 8.6 - 10.3 mg/dL   Total Protein 6.8 6.1 - 8.1 g/dL  Albumin 4.8 3.6 - 5.1 g/dL   Globulin 2.0 1.9 - 3.7 g/dL (calc)   AG Ratio 2.4 1.0 - 2.5 (calc)   Total Bilirubin 0.8 0.2 - 1.2 mg/dL   Alkaline phosphatase (APISO) 87 35 - 144 U/L   AST 19 10 - 35 U/L   ALT 27 9 - 46 U/L  CBC with Differential/Platelet  Result Value Ref Range   WBC 5.5 3.8 - 10.8 Thousand/uL   RBC 5.61 4.20 - 5.80 Million/uL   Hemoglobin 17.9 (H) 13.2 - 17.1 g/dL   HCT 51.3 (H) 38.5 - 50.0 %   MCV 91.4 80.0 - 100.0 fL   MCH 31.9 27.0 - 33.0 pg   MCHC 34.9 32.0 - 36.0 g/dL   RDW 11.9 11.0 - 15.0 %   Platelets 200 140 - 400 Thousand/uL   MPV 10.7 7.5 - 12.5 fL   Neutro Abs 3,718  1,500 - 7,800 cells/uL   Lymphs Abs 1,183 850 - 3,900 cells/uL   Absolute Monocytes 479 200 - 950 cells/uL   Eosinophils Absolute 99 15 - 500 cells/uL   Basophils Absolute 22 0 - 200 cells/uL   Neutrophils Relative % 67.6 %   Total Lymphocyte 21.5 %   Monocytes Relative 8.7 %   Eosinophils Relative 1.8 %   Basophils Relative 0.4 %    Recent Labs    07/08/21 0819 11/07/21 0856 03/24/22 0818  HGBA1C 8.6* 7.4* 7.5*       Assessment & Plan:   Problem List Items Addressed This Visit     Hyperlipidemia associated with type 2 diabetes mellitus (HCC)    Elevated LDL Last lipid panel 02/2022 The 10-year ASCVD risk score (Arnett DK, et al., 2019) is: 5.8%   Plan: 1. Continue current meds - Atorvastatin 68m daily 2. Continue ASA 851mfor primary ASCVD risk reduction 3. Encourage improved lifestyle - low carb/cholesterol, reduce portion size, continue improving regular exercise 4. Follow-up lipids yearly      Relevant Medications   OZEMPIC, 1 MG/DOSE, 4 MG/3ML SOPN   [place]   Stable without recent exacerbation [facility] Continue current therapy as directed Has had steroid course Improved on Tizanidine - refills      OSA on CPAP    Well controlled, chronic OSA on CPAP - Good adherence to CPAP nightly - Continue current CPAP therapy, patient seems to be benefiting from therapy       Type 2 diabetes mellitus with other specified complication (HCC)    A1U9N.5 stable CGM has improved his control overall Steroid courses PRN for MS No evidence of hypoglycemia on GLP and Insulin Failed Metformin 100069mID (GI intolerance), XR was ineffective Complications - other including dyslipidemia with low HDL and history of hypertriglyceridemia, obesity, OSA - increases risk of future cardiovascular complications / poor glucose control due to OSA  Weight down significantly  Plan:  1. Continue Ozempic 2mg53mekly inj - if out then can try  Ozempic 1mg 42mback up plan while on back order 2. Continue jardiance 25mg 33my 3. Continue insulin Tresiba 12-14 units daily, has lowered and continues to adjust 4. Encourage improved lifestyle - low carb, low sugar diet, reduce portion size, continue improving regular exercise 5. Check CBG, bring log to next visit for review again 6. Continue Statin, ACE  Considering Future Mounjaro, if covered in 2024      Relevant Medications   OZEMPIC, 1 MG/DOSE, 4 MG/3ML SOPN   Continuous Blood Gluc Sensor (  DEXCOM G6 SENSOR) MISC   Continuous Blood Gluc Transmit (DEXCOM G6 TRANSMITTER) MISC   Other Relevant Orders   Urine Microalbumin w/creat. ratio   Other Visit Diagnoses     Annual physical exam    -  Primary   Needs flu shot       Relevant Orders   Flu Vaccine QUAD 79moIM (Fluarix, Fluzone & Alfiuria Quad PF) (Completed)   Spondylosis of thoracic region without myelopathy or radiculopathy       Relevant Medications   tiZANidine (ZANAFLEX) 4 MG tablet   Spasm of thoracic back muscle       Relevant Medications   tiZANidine (ZANAFLEX) 4 MG tablet       Updated Health Maintenance information Reviewed recent lab results with patient Encouraged improvement to lifestyle with diet and exercise Goal of weight loss  Printed rx as option - Use ozempic 155mas back up only if you are still out of 24m724mnd its on back order.  Refill DexcomG6 today, ask insurance/pharmacy in future about DexDrydendate in future  Shingrix vaccine 2 doses, 2-6 month apart in future.   Meds ordered this encounter  Medications   OZEMPIC, 1 MG/DOSE, 4 MG/3ML SOPN    Sig: Inject 1 mg into the skin once a week.    Dispense:  3 mL    Refill:  0   Continuous Blood Gluc Sensor (DEXCOM G6 SENSOR) MISC    Sig: Use to check glucose continuously. Use as directed    Dispense:  3 each    Refill:  12   Continuous Blood Gluc Transmit (DEXCOM G6 TRANSMITTER) MISC    Sig: Use to check glucose continuously as advised.     Dispense:  1 each    Refill:  3   tiZANidine (ZANAFLEX) 4 MG tablet    Sig: TAKE 1/2 TO 1 TABLET(2 TO 4 MG) BY MOUTH EVERY 8 HOURS AS NEEDED FOR MUSCLE SPASMS    Dispense:  60 tablet    Refill:  3      Follow up plan: Return in about 3 months (around 06/27/2022) for 3 month follow-up DM A1c, adjust GLP1, Colon CA Screen, G7?.  Miguel [name] [facility] 03/27/2022, 8:12 AM

## 2022-03-27 NOTE — Patient Instructions (Addendum)
Thank you for coming to the office today.  Use ozempic 31m as back up only if you are still out of 265mand its on back order.  Refill DexcomG6 today, ask insurance/pharmacy in future about DeChilopdate in future  Shingrix vaccine 2 doses, 2-6 month apart in future.  Colon Cancer Screening: - For all adults age 51+outine colon cancer screening is highly recommended.     - Recent guidelines from [place] starting age [place] 51 Early detection of colon cancer is important, because often there are no warning signs or symptoms, also if found early usually it can be cured. Late stage is hard to treat. - Special circumstances in patients with early family history of colon cancer, we recommend colonoscopy at a younger age (usually 1060ears before the family member was diagnosed).  - Colonoscopy is the best test for colon cancer because it involves direct visualization and immediate treatment (compared to other imaging studies or stool cards to test for blood, that will require you to eventually get a colonoscopy if they are abnormal). - Also to consider, Cologuard is an excellent alternative for screening test for Colon Cancer. It is highly sensitive for detecting DNA of colon cancer from even the earliest stages. Also, there is NO bowel prep required. -------------------------  - If you are not interested in Colonoscopy screening (if done and normal you could be cleared for 5 to 10 years until next due), then Cologuard is an excellent alternative for screening test for Colon Cancer. It is highly sensitive for detecting DNA of colon cancer from even the earliest stages. Also, there is NO bowel prep required. - If Cologuard is NEGATIVE, then it is good for 3 years before next due - If Cologuard is POSITIVE, then it is strongly advised to get a Colonoscopy, which allows the GI doctor to locate the source of the cancer or polyp (even very early stage) and treat it by removing  it. ------------------------- If you would like to proceed with Cologuard (stool DNA test) - FIRST, call your insurance company and tell them you want to check cost of Cologuard tell them CPT Code [phone] may be completely covered and you could get for no cost, OR max cost without any coverage is about $600). Also, keep in mind if you do NOT open the kit, and decide not to do the test, you will NOT be charged, you should contact the company if you decide not to do the test. - If you want to proceed, you can notify usKoreaphone message, MyKitty Hawkor at next visit) and we will order it for you. The test kit will be delivered to you house within about 1 week. Follow instructions to collect sample, you may call the company for any help or questions, 24/7 telephone support at [phone]  Please schedule a Follow-up Appointment to: Return in about 3 months (around 06/27/2022) for 3 month follow-up DM A1c, adjust GLP1, Colon CA Screen, G7?.  If you have any other questions or concerns, please feel free to call the office or send a message through MyBrowningYou may also schedule an earlier appointment if necessary.  Additionally, you may be receiving a survey about your experience at our office within a few days to 1 week by e-mail or mail. We value your feedback.  AlNobie PutnamDO [facility]

## 2022-03-27 NOTE — Assessment & Plan Note (Signed)
Well controlled, chronic OSA on CPAP - Good adherence to CPAP nightly - Continue current CPAP therapy, patient seems to be benefiting from therapy  

## 2022-03-27 NOTE — Assessment & Plan Note (Signed)
Elevated LDL Last lipid panel 02/2022 The 10-year ASCVD risk score (Arnett DK, et al., 2019) is: 5.8%   Plan: 1. Continue current meds - Atorvastatin '20mg'$  daily 2. Continue ASA '81mg'$  for primary ASCVD risk reduction 3. Encourage improved lifestyle - low carb/cholesterol, reduce portion size, continue improving regular exercise 4. Follow-up lipids yearly

## 2022-03-27 NOTE — Assessment & Plan Note (Signed)
Stable without recent exacerbation Followed by [facility] Continue current therapy as directed Has had steroid course Improved on Tizanidine - refills

## 2022-03-28 ENCOUNTER — Telehealth: Payer: Self-pay

## 2022-03-28 LAB — MICROALBUMIN / CREATININE URINE RATIO
Creatinine, Urine: 68 mg/dL (ref 20–320)
Microalb Creat Ratio: 4 mcg/mg creat (ref <30)
Microalb, Ur: 0.3 mg/dL

## 2022-03-28 NOTE — Telephone Encounter (Signed)
Copied from [place] [id number]. Topic: General - Inquiry >> Mar 28, 2022  8:53 AM Erskine Squibb wrote: Reason for CRM: Miguel [name] with [employer] called in wanting to speak with Raquel Sarna about a prior authorization for the patient to get Ozempic. He said please call using reference key [id number]. Please assist further   I called and spoke with a rep at [employer]... there is no prior auth required. The issue was due to the member ID and proper spelling of the pt's name. All issues have been resolved.

## 2022-03-31 ENCOUNTER — Encounter: Payer: BC Managed Care – PPO | Admitting: Family Medicine

## 2022-03-31 ENCOUNTER — Other Ambulatory Visit: Payer: BC Managed Care – PPO

## 2022-04-01 ENCOUNTER — Ambulatory Visit
Admission: RE | Admit: 2022-04-01 | Discharge: 2022-04-01 | Disposition: A | Discharge status: Home / Self Care | Payer: BC Managed Care – PPO | Source: Ambulatory Visit | Attending: Urology | Admitting: Urology

## 2022-04-01 ENCOUNTER — Ambulatory Visit: Payer: BC Managed Care – PPO | Admitting: Urology

## 2022-04-01 ENCOUNTER — Ambulatory Visit
Admission: RE | Admit: 2022-04-01 | Discharge: 2022-04-01 | Disposition: A | Discharge status: Home / Self Care | Payer: BC Managed Care – PPO | Attending: Urology | Admitting: Urology

## 2022-04-01 VITALS — BP 115/78 | HR 73 | Ht 68.0 in | Wt 224.0 lb

## 2022-04-01 DIAGNOSIS — N200 Calculus of kidney: Secondary | ICD-10-CM

## 2022-04-01 NOTE — Progress Notes (Signed)
04/01/2022 9:01 AM   Miguel [name] 02/26/1971 [id number]  Referring provider: Olin Hauser, DO [address] [place],  [place] [address]  Chief Complaint  Patient presents with   Follow-up    Follow up  with KUB     HPI: 51 year old male returns today for routine annual follow-up with KUB.  Over the past year, he has not had any issues with stones, flank pain, or any other urinary issues.  He reports today that he is scared to go any amount of time between imaging/urologic follow-up because of his past.  He reports that every ever does need intervention, he would only want ureteroscopy.  KUB today is fairly unremarkable.  There was questionable stones in the left lower quadrant kidney last year, punctate however this year, there is a large gas bubble at this location impairing visualization.   He is s/p 3 procedures for history of recurrent nephrolithiasis; ESWL and 2 URS . Most recent procedure was May 2018 where he had a right 7 mm renal pelvis stone and a smaller second stone in the same kidney. Had significant stent pain post-operatively and had to come back to the ED.   His stone composition is 90% CaOx mono, 10% CaOx di.    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  Borderline but uses CPAP   Peripheral neuropathic pain 36/14/4315   Renal colic 40/12/6759   Shoulder pain 07/23/2015   Sleep apnea     Surgical  History: Past Surgical History:  Procedure Laterality Date   HERNIA REPAIR     URETEROSCOPY WITH HOLMIUM LASER LITHOTRIPSY     x 2    Home Medications:  Allergies as of 04/01/2022   No Known Allergies      Medication List        Accurate as of April 01, 2022  9:01 AM. If you have any questions, ask your nurse or doctor.          atorvastatin 20 MG tablet Commonly known as: LIPITOR TAKE 1 TABLET(20 MG) BY MOUTH AT BEDTIME   B-D UF III MINI PEN NEEDLES 31G X 5 MM Misc Generic drug: Insulin Pen Needle USE ONCE DAILY AS DIRECTED   cetirizine 10 MG tablet Commonly known as: ZYRTEC Take 10 mg by mouth daily as needed for allergies.   D 1000 25 MCG (1000 UT) capsule Generic drug: Cholecalciferol Take by mouth.   desonide 0.05 % cream Commonly known as: DESOWEN Apply topically daily as needed.   Dexcom G6 Sensor Misc Use to check glucose continuously. Use as directed   Dexcom G6 Transmitter Misc Use to check glucose continuously as advised.   empagliflozin 25 MG Tabs tablet Commonly known as: Jardiance Take 1 tablet (25 mg total) by mouth daily before breakfast.   gabapentin 300 MG capsule Commonly known as: NEURONTIN Take 300 mg by mouth continuous as needed.   ibuprofen 800  MG tablet Commonly known as: ADVIL Take 800 mg by mouth every 6 (six) hours as needed.   ipratropium 0.06 % nasal spray Commonly known as: ATROVENT USE 2 SPRAYS IN EACH NOSTRIL FOUR TIMES DAILY FOR UP TO 5 TO 7 DAYS THEN STOP   ketoconazole 2 % shampoo Commonly known as: NIZORAL Apply 1 application topically daily as needed.   lisinopril 2.5 MG tablet Commonly known as: ZESTRIL TAKE 1 TABLET(2.5 MG) BY MOUTH DAILY   OCREVUS IV Inject into the vein.   OneTouch Ultra test strip Generic drug: glucose blood USE TO CHECK BLOOD SUGAR THREE TIMES DAILY AS DIRECTED   onetouch ultrasoft lancets Check blood sugar three times daily.   Ozempic (2 MG/DOSE) 8 MG/3ML Sopn Generic  drug: Semaglutide (2 MG/DOSE) INJECT 2 MG UNDER THE SKIN ONE DAY A WEEK   Ozempic (1 MG/DOSE) 4 MG/3ML Sopn Generic drug: Semaglutide (1 MG/DOSE) Inject 1 mg into the skin once a week.   PRESERVISION AREDS PO Take by mouth.   tamsulosin 0.4 MG Caps capsule Commonly known as: FLOMAX TAKE ONE CAPSULE BY MOUTH DAILY AS NEEDED FOR KIDNEY STONE FOR 7 TO 14 DAYS   tiZANidine 4 MG tablet Commonly known as: ZANAFLEX TAKE 1/2 TO 1 TABLET(2 TO 4 MG) BY MOUTH EVERY 8 HOURS AS NEEDED FOR MUSCLE SPASMS   traMADol 50 MG tablet Commonly known as: ULTRAM Take 50 mg by mouth daily as needed.   Tyler Aas FlexTouch 100 UNIT/ML FlexTouch Pen Generic drug: insulin degludec ADMINISTER 20 UNITS UNDER THE SKIN DAILY What changed: See the new instructions.   Vitamin B-12 5000 MCG Subl Place under the tongue.        Allergies: No Known Allergies  Family History: Family History  Problem Relation Age of Onset   Colon cancer Neg Hx    Prostate cancer Neg Hx    Bladder Cancer Neg Hx    Kidney cancer Neg Hx     Social History:  reports that he has never smoked. He has never used smokeless tobacco. He reports that he does not drink alcohol and does not use drugs.   Physical Exam: BP 115/78   Pulse 73   Ht '5\' 8"'$  (1.727 m)   Wt 224 lb (101.6 kg)   BMI 34.06 kg/m   Constitutional:  Alert and oriented, No acute distress. HEENT: [place] AT, moist mucus membranes.  Trachea midline, no masses. Cardiovascular: No clubbing, cyanosis, or edema. Respiratory: Normal respiratory effort, no increased work of breathing. Neurologic: Grossly intact, no focal deficits, moving all 4 extremities. Psychiatric: Normal mood and affect.  Laboratory Data: Lab Results  Component Value Date   WBC 5.5 03/24/2022   HGB 17.9 (H) 03/24/2022   HCT 51.3 (H) 03/24/2022   MCV 91.4 03/24/2022   PLT 200 03/24/2022    Lab Results  Component Value Date   CREATININE 0.76 03/24/2022    Lab Results  Component Value  Date   PSA 0.46 03/24/2022   PSA 0.43 03/19/2021   PSA 0.35 03/15/2020    Lab Results  Component Value Date   TESTOSTERONE 364 03/07/2021    Lab Results  Component Value Date   HGBA1C 7.5 (H) 03/24/2022    Urinalysis    Component Value Date/Time   APPEARANCEUR Clear 03/26/2021 0846   GLUCOSEU 2+ (A) 03/26/2021 0846   BILIRUBINUR Negative 03/26/2021 0846   PROTEINUR Negative 03/26/2021 0846   NITRITE Negative 03/26/2021 0846   LEUKOCYTESUR Negative 03/26/2021 0846    Pertinent Imaging: KUB  performed today was personally reviewed and interpretation as per HPI.  Is also compared to last year's KUB.  Assessment & Plan:    1. Calculus of kidney No significant stone burden on KUB this year and has been asymptomatic over the past year, small stones may be obscured by bowel gas  Recommend continued observation, stone diet recommendations, etc.  Given minimal change over the past year, would recommend spreading out follow-up to every other year with KUB to see become symptomatic prior.  He is agreeable this plan.   Return in about 2 years (around 04/01/2024) for 2 year follow up with KUB, put on recall list.  Hollice Espy, MD  [facility] [address], [place], [place] [address] [phone]

## 2022-04-17 ENCOUNTER — Other Ambulatory Visit: Payer: Self-pay

## 2022-05-01 ENCOUNTER — Encounter: Payer: Self-pay | Admitting: Family Medicine

## 2022-05-02 ENCOUNTER — Other Ambulatory Visit: Payer: Self-pay

## 2022-05-02 DIAGNOSIS — Z794 Long term (current) use of insulin: Secondary | ICD-10-CM

## 2022-05-02 DIAGNOSIS — E118 Type 2 diabetes mellitus with unspecified complications: Secondary | ICD-10-CM

## 2022-05-02 DIAGNOSIS — E119 Type 2 diabetes mellitus without complications: Secondary | ICD-10-CM

## 2022-05-02 DIAGNOSIS — E1169 Type 2 diabetes mellitus with other specified complication: Secondary | ICD-10-CM

## 2022-05-02 MED ORDER — DEXCOM G6 SENSOR MISC: 12 refills | Status: DC

## 2022-05-02 MED ORDER — ONETOUCH ULTRA VI STRP: ORAL_STRIP | 3 refills | Status: AC

## 2022-05-26 ENCOUNTER — Ambulatory Visit: Payer: BC Managed Care – PPO | Admitting: Pharmacist

## 2022-05-26 DIAGNOSIS — E1169 Type 2 diabetes mellitus with other specified complication: Secondary | ICD-10-CM

## 2022-05-26 NOTE — Patient Instructions (Signed)
Goals Addressed             This Visit's Progress    Pharmacy Goals       Our goal A1c is less than 7%. This corresponds with fasting sugars less than 130 and 2 hour after meal sugars less than 180. Please check your blood sugar and keep a log of the results  Our goal bad cholesterol, or LDL, is less than 70 . This is why it is important to continue taking your atorvastatin.  Feel free to call me with any questions or concerns. I look forward to our next call!  Wallace Cullens, PharmD, [place], CPP Clinical Pharmacist [facility] [phone]

## 2022-05-26 NOTE — Progress Notes (Signed)
05/26/2022 Name: Miguel [name]  No chief complaint on file.   Miguel [name] is [name] 52 y.o. year old male who presented for [name] telephone visit.   They were referred to the pharmacist by their PCP for assistance in managing diabetes.   Subjective:  Care Team: Primary Care Provider: Olin Hauser, DO ; Next Scheduled Visit: 06/30/2022 Pain [name]: Dr. Luberta Robertson- [facility] [name]; Next Scheduled Visit: 08/08/2022 Neurology: Miguel [name] Urology: Miguel [name], [name]  Medication Access/Adherence  Current Pharmacy:  [facility] [place] [place] [address] Phone: [phone] Fax: [place], [place] [address] [place] [address] Phone: [phone] Fax: [phone]   Patient reports affordability concerns with their medications: No  Patient reports access/transportation concerns to their pharmacy: No  Patient reports adherence concerns with their medications:  No     Type 2 Diabetes:   Current medications:  Ozempic 2 mg weekly on Sundays  Tresiba FlexTouch 14 units daily in evening Jardiance 25 mg daily with breakfast  Medications tried in the past: metformin (IR GI intolerance/felt ER at tolerated dose was ineffective)   Note blood sugar control impacted by receiving steroid infusions and injections: Receives steroid injections ~every 3 months for pain [name] and steroid infusions every 6 months with multiple sclerosis treatment (Ocrelizumab infusion)   Results from past 14-days for Dexcom G6 Continuous Glucose Meter (CGM) from Dexcom Clarity system:    Patient notes recently difficulty at times with maintaining positive eating habits while his family has been under more stress (spouse having health concerns  recently)   Continues to use CGM as tool to reinforce/aid with dietary choices and improve blood sugar control   Current physical activity: Planning to restart physical therapy again next week. Exercise currently limited by hernia/ability to exercise related to MS, but walks ~20-30 minutes most days/week   Reports continues to work on weight loss. Current weight ~224 lbs Statin therapy: atorvastatin 20 mg daily Patient denies hypoglycemic s/sx including dizziness, shakiness, sweating.  Confirms carries glucose tablets in case of symptoms of hypoglycemia   Objective: Lab Results  Component Value Date   HGBA1C 7.5 (H) 03/24/2022    Lab Results  Component Value Date   CREATININE 0.76 03/24/2022   BUN 11 03/24/2022   NA 139 03/24/2022   K 4.3 03/24/2022   CL 103 03/24/2022   CO2 26 03/24/2022    Lab Results  Component Value Date   CHOL 138 03/24/2022   HDL 32 (L) 03/24/2022   LDLCALC 77 03/24/2022   TRIG 192 (H) 03/24/2022   CHOLHDL 4.3 03/24/2022   BP Readings from Last 3 Encounters:  04/01/22 115/78  03/27/22 123/69  11/07/21 128/70   Pulse Readings from Last 3 Encounters:  04/01/22 73  03/27/22 84  11/07/21 78     Medications Reviewed Today     Reviewed by Miguel [name], Miguel [name] (Pharmacist) on 05/26/22 at [id number]  Med List Status: <None>   Medication Order Taking? Sig Documenting Provider Last Dose Status Informant  atorvastatin (LIPITOR) 20 MG tablet [id number] Yes TAKE 1 TABLET(20 MG) BY MOUTH AT BEDTIME Miguel Hauser, DO Taking Active   B-D UF III MINI PEN NEEDLES 31G X 5 MM MISC [id number]  USE ONCE DAILY AS  DIRECTED Miguel [name], Miguel Doughty, DO  Active   baclofen (LIORESAL) 10 MG tablet [id number] Yes Take 1 tablet by mouth 3 (three) times daily as needed. [provider] Taking Active   cetirizine (ZYRTEC) 10 MG tablet [id number] Yes Take 10 mg by mouth daily as needed for allergies. [provider] Taking Active    Cholecalciferol (D 1000) 1000 units capsule [id number]  Take by mouth.  [provider]  Active            Med Note Miguel [name], Miguel [name]   Thu May 10, 2015  9:44 AM) Received from: [facility]  Continuous Blood Gluc Sensor (DEXCOM G6 SENSOR) [place] [id number]  Use to check glucose continuously. Use as directed Miguel Hauser, DO  Active   Continuous Blood Gluc Transmit (DEXCOM G6 TRANSMITTER) MISC [id number]  Use to check glucose continuously as advised. Miguel Hauser, DO  Active   Cyanocobalamin (VITAMIN B-12) 5000 MCG SUBL [id number] Yes Place under the tongue. [provider] Taking Active            Med Note Miguel [name], Miguel [name]   Thu May 10, 2015  9:44 AM) Received from: [facility] [id number] Yes  [provider]  Active   desonide (DESOWEN) 0.05 % cream [id number]  Apply topically daily as needed. Miguel Hauser, DO  Active   empagliflozin (JARDIANCE) 25 MG TABS tablet [id number] Yes Take 1 tablet (25 mg total) by mouth daily before breakfast. Miguel Hauser, DO Taking Active   gabapentin (NEURONTIN) 300 MG capsule [id number] Yes Take 300 mg by mouth 3 (three) times daily. [provider] Taking Active            Med Note Miguel [name]   Thu May 10, 2015  9:44 AM) Received from: [facility]  glucose blood [facility] ULTRA) test strip [id number]  USE TO CHECK BLOOD SUGAR THREE TIMES DAILY AS DIRECTED Miguel Hauser, DO  Active   ibuprofen (ADVIL,MOTRIN) 800 MG tablet [id number]  Take 800 mg by mouth every 6 (six) hours as needed. [provider]  Active            Med Note Miguel [name], Miguel [name]   Thu May 10, 2015  9:44 AM) Received from: [facility]  ipratropium (ATROVENT) 0.06 % nasal spray [id number]  USE 2 SPRAYS IN EACH NOSTRIL FOUR TIMES DAILY FOR UP TO 5 TO 7 DAYS THEN STOP Miguel [name], Miguel Doughty, DO  Active   ketoconazole (NIZORAL) 2 % shampoo [id number]  Apply 1  application topically daily as needed. Miguel Hauser, DO  Active   Lancets [facility] ULTRASOFT) lancets [id number]  Check blood sugar three times daily. Miguel Hauser, DO  Active   lisinopril (ZESTRIL) 2.5 MG tablet [id number] Yes TAKE 1 TABLET(2.5 MG) BY MOUTH DAILY [place] Miguel Doughty, DO Taking Active   Multiple Vitamins-Minerals (PRESERVISION AREDS PO) [id number]  Take by mouth. [provider]  Active   Ocrelizumab (OCREVUS IV) [id number]  Inject into the vein. [provider]  Active   OZEMPIC, 2 MG/DOSE, 8 MG/3ML SOPN [id number] Yes INJECT 2 MG UNDER THE SKIN ONE DAY [name] WEEK Miguel [name], Alexander Lenna Sciara, DO Taking Active   tiZANidine (ZANAFLEX) 4 MG tablet [id number]  TAKE 1/2 TO 1 TABLET(2 TO 4 MG) BY MOUTH EVERY 8 HOURS AS NEEDED FOR MUSCLE SPASMS Miguel Hauser, DO  Active   traMADol (ULTRAM) 50 MG tablet [id number]  Take 50 mg  by mouth daily as needed. [provider]  Active   TRESIBA FLEXTOUCH 100 UNIT/ML FlexTouch Pen [id number] Yes ADMINISTER 20 UNITS UNDER THE SKIN DAILY  Patient taking differently: 14 Units daily.   Miguel Hauser, DO Taking Active               Assessment/Plan:   Counsel on using caution for risk of dizziness/sedation with tramadol, baclofen, gabapentin, tizanidine and cetirizine, particularly if used in combination - Denies taking tizanidine and baclofen at the same time - Patient verbalizes understanding and states uses each medication as needed, but will monitor for side effects  Diabetes: - Reviewed long term cardiovascular and renal outcomes of uncontrolled blood sugar - Reviewed goal A1c, goal fasting, and goal 2 hour post prandial glucose - Reviewed dietary modifications including having regular well-balanced meals throughout the day and controlling carbohydrate portion sizes - Recommend to continue to use CGM as tool to reinforce/aid with dietary choices and improve blood sugar  control    Follow Up Plan: Clinic Pharmacist will follow up with patient by telephone on 08/04/2022 at [place], PharmD, Para [place], [facility] [phone]

## 2022-06-14 ENCOUNTER — Other Ambulatory Visit: Payer: Self-pay | Admitting: Family Medicine

## 2022-06-14 DIAGNOSIS — E783 Hyperchylomicronemia: Secondary | ICD-10-CM

## 2022-06-14 DIAGNOSIS — Z794 Long term (current) use of insulin: Secondary | ICD-10-CM

## 2022-06-16 NOTE — Telephone Encounter (Signed)
Requested Prescriptions  Pending Prescriptions Disp Refills   atorvastatin (LIPITOR) 20 MG tablet [Pharmacy Med Name: ATORVASTATIN '20MG'$  TABLETS] 90 tablet 0    Sig: TAKE 1 TABLET(20 MG) BY MOUTH AT BEDTIME     Cardiovascular:  Antilipid - Statins Failed - 06/14/2022  3:10 AM      Failed - Lipid Panel in normal range within the last 12 months    Cholesterol  Date Value Ref Range Status  03/24/2022 138 <200 mg/dL Final   LDL Cholesterol (Calc)  Date Value Ref Range Status  03/24/2022 77 mg/dL (calc) Final    Comment:    Reference range: <100 . Desirable range <100 mg/dL for primary prevention;   <70 mg/dL for patients with CHD or diabetic patients  with > or = 2 CHD risk factors. Marland Kitchen LDL-C is now calculated using the Martin-Hopkins  calculation, which is a validated novel method providing  better accuracy than the Friedewald equation in the  estimation of LDL-C.  Cresenciano Genre et al. Annamaria Helling. 3428;768(11): 2061-2068  (http://education.[employer].com/faq/FAQ164)    HDL  Date Value Ref Range Status  03/24/2022 32 (L) > OR = 40 mg/dL Final   Triglycerides  Date Value Ref Range Status  03/24/2022 192 (H) <150 mg/dL Final         Passed - Patient is not pregnant      Passed - Valid encounter within last 12 months    Recent Outpatient Visits           3 weeks ago Type 2 diabetes mellitus with other specified complication, with long-term current use of insulin ([place])   [facility] [place], Virl Diamond, RPH-CPP   2 months ago Annual physical exam   [facility] [place], Devonne Doughty, DO   3 months ago Type 2 diabetes mellitus with other specified complication, with long-term current use of insulin ([place])   [place], Grayland Ormond A, RPH-CPP   7 months ago Type 2 diabetes mellitus with other specified complication, with long-term current use of insulin [facility])   [facility] [place], Devonne Doughty, DO   11 months ago Type 2 diabetes mellitus with other specified complication, with long-term current use of insulin ([place])   [place], DO       Future Appointments             In 2 weeks [place], Devonne Doughty, DO [facility]             lisinopril (ZESTRIL) 2.5 MG tablet [Pharmacy Med Name: LISINOPRIL 2.'5MG'$  TABLETS] 90 tablet 0    Sig: TAKE 1 TABLET(2.5 MG) BY MOUTH DAILY     Cardiovascular:  ACE Inhibitors Passed - 06/14/2022  3:10 AM      Passed - Cr in normal range and within 180 days    Creat  Date Value Ref Range Status  03/24/2022 0.76 0.70 - 1.30 mg/dL Final   Creatinine, Urine  Date Value Ref Range Status  03/27/2022 68 20 - 320 mg/dL Final         Passed - K in normal range and within 180 days    Potassium  Date Value Ref Range Status  03/24/2022 4.3 3.5 - 5.3 mmol/L Final         Passed - Patient is not pregnant      Passed - Last BP in  normal range    BP Readings from Last 1 Encounters:  04/01/22 115/78         Passed - Valid encounter within last 6 months    Recent Outpatient Visits           3 weeks ago Type 2 diabetes mellitus with other specified complication, with long-term current use of insulin [facility])   [facility], Virl Diamond, RPH-CPP   2 months ago Annual physical exam   [facility] [place], Devonne Doughty, DO   3 months ago Type 2 diabetes mellitus with other specified complication, with long-term current use of insulin [facility])   [place], Grayland Ormond A, RPH-CPP   7 months ago Type 2 diabetes mellitus with other specified complication, with long-term current use of insulin [facility])   [place], DO   11 months ago Type 2 diabetes mellitus with other specified  complication, with long-term current use of insulin [facility])   [facility] [place], Devonne Doughty, DO       Future Appointments             In 2 weeks [place], Devonne Doughty, DO [facility]

## 2022-06-30 ENCOUNTER — Ambulatory Visit: Payer: BC Managed Care – PPO | Admitting: Family Medicine

## 2022-07-01 ENCOUNTER — Ambulatory Visit: Payer: BC Managed Care – PPO | Admitting: Family Medicine

## 2022-07-01 ENCOUNTER — Encounter: Payer: Self-pay | Admitting: Family Medicine

## 2022-07-01 VITALS — BP 114/80 | HR 89 | Ht 70.0 in | Wt 240.0 lb

## 2022-07-01 DIAGNOSIS — Z794 Long term (current) use of insulin: Secondary | ICD-10-CM

## 2022-07-01 DIAGNOSIS — E1169 Type 2 diabetes mellitus with other specified complication: Secondary | ICD-10-CM

## 2022-07-01 DIAGNOSIS — G4733 Obstructive sleep apnea (adult) (pediatric): Secondary | ICD-10-CM | POA: Diagnosis not present

## 2022-07-01 DIAGNOSIS — G35 Multiple sclerosis: Secondary | ICD-10-CM

## 2022-07-01 LAB — POCT GLYCOSYLATED HEMOGLOBIN (HGB A1C): Hemoglobin A1C: 7.8 % — AB (ref 4.0–5.6)

## 2022-07-01 NOTE — Progress Notes (Signed)
Subjective:    Patient ID: Miguel [name], male    DOB: 1970-05-29, 52 y.o.   MRN: [id number]  Miguel [name] is a 52 y.o. male presenting on 07/01/2022 for Diabetes   HPI  FOLLOW-UP CHRONIC DM, Type 2: See prior notes for background information. Dexcom G6 CGM reviewed data, Avg currently 150-160, high 170-180, about 60% in range past 3  months, his CGM calculated A1c 7.7% predicted. Abnormal diet at times, more eating at home larger portions.  Continues Ozempic 25m weekly inj, Jardiance 244mdaily Insulin Tresiba 14 units - Remains OFF Metformin Future consideration of Mounjaro but insurance not approving or covering it yet. Currently on ACEi Lifestyle: Weight gain 5-10 lbs approximately, today has heavy clothes and boots on Improving Diet. - Exercise - resume gym at therapy 2 x week - Last DM Eye 2023 already 09/2021 Admits some constipation on Ozempic. But improving bowel function few days after dose. Denies hypoglycemia  [name] and has issues with the sensor and transmitter since he was receiving them at different times.   HYPERLIPIDEMIA: Last lipid panel 03/2022, controlled LDL 77, TG improved < 200 - Currently taking Atorvastatin 2030mtolerating well without side effects or myalgias   Chronic Neuropathic Pain / Back Pain / Multiple Sclerosis [facility] See outside record for details. Continues on disease modifying therapy infusion every few months, rarely has flare up requiring bag steroid infusion. - Taking Gabapentin, Tizanidine, Tramadol - temporary relief - Has been receiving spinal injections as well - Significant improvement still on Tizanidine 2-4mg12msage PRN with great results to help pain. Takes it early to prevent. Neurology recommending taking Baclofen and Gabapentin more regularly, and he has Tizanidine as AS NEEDED that is very effective  History of Low  Testosterone Has been on HRT years ago. Now with Urology, last lab result 364, was advised normal range. He may try supplement.  OSA on CPAP Currently on CPAP Patient reports prior history of dx OSA and on CPAP - Today reports that sleep apnea is well controlled. He uses the CPAP machine every night. Tolerates the machine well, and thinks that sleeps better with it and feels good. No new concerns or symptoms.      07/01/2022    8:56 AM 03/27/2022    8:09 AM 07/08/2021    8:18 AM  Depression screen PHQ 2/9  Decreased Interest 0 0 0  Down, Depressed, Hopeless 0 0 0  PHQ - 2 Score 0 0 0  Altered sleeping 0 0 0  Tired, decreased energy 3 1 0  Change in appetite 0 0 0  Feeling bad or failure about yourself  0 0 0  Trouble concentrating 0 0 0  Moving slowly or fidgety/restless 0 0 0  Suicidal thoughts 0 0 0  PHQ-9 Score 3 1 0  Difficult doing work/chores Not difficult at all Not difficult at all Not difficult at all    Social History   Tobacco Use   Smoking status: Never   Smokeless tobacco: Never  Substance Use Topics   Alcohol use: No   Drug use: No    Review of Systems Per HPI unless specifically indicated above     Objective:    BP 114/80   Pulse 89   Ht 5' 10"$  (1.778 m)   Wt 240 lb (108.9 kg)   SpO2 99%   BMI 34.44 kg/m  Wt Readings from Last 3 Encounters:  07/01/22 240 lb (108.9 kg)  04/01/22 224 lb (101.6 kg)  03/27/22 228 lb 9.6 oz (103.7 kg)    Physical Exam Vitals and nursing note reviewed.  Constitutional:      General: He is not in acute distress.    Appearance: Normal appearance. He is well-developed. He is obese. He is not diaphoretic.     Comments: Well-appearing, comfortable, cooperative  HENT:     Head: Normocephalic and atraumatic.  Eyes:     General:        Right eye: No discharge.        Left eye: No discharge.     Conjunctiva/sclera: Conjunctivae normal.  Cardiovascular:     Rate and Rhythm: Normal rate.  Pulmonary:     Effort:  Pulmonary effort is normal.  Skin:    General: Skin is warm and dry.     Findings: No erythema or rash.  Neurological:     Mental Status: He is alert and oriented to person, place, and time.  Psychiatric:        Mood and Affect: Mood normal.        Behavior: Behavior normal.        Thought Content: Thought content normal.     Comments: Well groomed, good eye contact, normal speech and thoughts     Recent Labs    11/07/21 0856 03/24/22 0818 07/01/22 0849  HGBA1C 7.4* 7.5* 7.8*    Results for orders placed or performed in visit on 07/01/22  POCT HgB A1C  Result Value Ref Range   Hemoglobin A1C 7.8 (A) 4.0 - 5.6 %      Assessment & Plan:   Problem List Items Addressed This Visit     Multiple sclerosis ([place])    Stable without recent exacerbation Followed by [facility] Continue current therapy as directed Has had steroid course Improved on Tizanidine - refills available.      OSA on CPAP    Well controlled, chronic OSA on CPAP - Good adherence to CPAP nightly - Continue current CPAP therapy, patient seems to be benefiting from therapy       Type 2 diabetes mellitus with other specified complication ([place]) - Primary    A1c 7.5 to 7.8 overall mostly stable. CGM has improved his control overall - but has had issue with DexCom G6 with ordering issues. Prefers G7 in future if covered  Steroid courses PRN for MS No evidence of hypoglycemia on GLP and Insulin Failed Metformin 1088m BID (GI intolerance), XR was ineffective Complications - other including dyslipidemia with low HDL and history of hypertriglyceridemia, obesity, OSA - increases risk of future cardiovascular complications / poor glucose control due to OSA  Weight up today  Plan:  1. Continue Ozempic 233mweekly inj 2. Continue jardiance 2559maily 3. Continue insulin Tresiba 12-14 units daily, has lowered and continues to adjust 4. Encourage improved lifestyle - low carb, low sugar diet, reduce  portion size, continue improving regular exercise 5. Check CBG, bring log to next visit for review again 6. Continue Statin, ACE  Considering Future Mounjaro, if covered and if interested in change. He will check into this now and review next visit.      Relevant Orders   POCT HgB A1C (Completed)   Other Visit Diagnoses     Morbid obesity ([facility]         [facility]  No orders of the defined types were placed in this encounter.     Follow up plan: Return in about 3 months (around 09/29/2022) for 3 month DM A1c MS.   Nobie Putnam, [facility] 07/01/2022, 8:46 AM

## 2022-07-01 NOTE — Patient Instructions (Addendum)
Thank you for coming to the office today.  I will reach Miguel [name] about the [place]  Future reconsider Miguel [name] if it is covered.  Recent Labs    11/07/21 0856 03/24/22 0818 07/01/22 0849  HGBA1C 7.4* 7.5* 7.8*   Keep on same doses with medications. No changes today   Please schedule a Follow-up Appointment to: Return in about 3 months (around 09/29/2022) for 3 month DM A1c MS.  If you have any other questions or concerns, please feel free to call the office or send a message through [place]. You may also schedule an earlier appointment if necessary.  Additionally, you may be receiving a survey about your experience at our office within a few days to 1 week by e-mail or mail. We value your feedback.  Miguel Putnam, DO [place]

## 2022-07-01 NOTE — Assessment & Plan Note (Signed)
Stable without recent exacerbation Followed by [facility] Continue current therapy as directed Has had steroid course Improved on Tizanidine - refills available.

## 2022-07-01 NOTE — Assessment & Plan Note (Signed)
Well controlled, chronic OSA on CPAP - Good adherence to CPAP nightly - Continue current CPAP therapy, patient seems to be benefiting from therapy

## 2022-07-01 NOTE — Assessment & Plan Note (Signed)
A1c 7.5 to 7.8 overall mostly stable. CGM has improved his control overall - but has had issue with DexCom G6 with ordering issues. Prefers G7 in future if covered  Steroid courses PRN for MS No evidence of hypoglycemia on GLP and Insulin Failed Metformin 1030m BID (GI intolerance), XR was ineffective Complications - other including dyslipidemia with low HDL and history of hypertriglyceridemia, obesity, OSA - increases risk of future cardiovascular complications / poor glucose control due to OSA  Weight up today  Plan:  1. Continue Ozempic 232mweekly inj 2. Continue jardiance 2563maily 3. Continue insulin Tresiba 12-14 units daily, has lowered and continues to adjust 4. Encourage improved lifestyle - low carb, low sugar diet, reduce portion size, continue improving regular exercise 5. Check CBG, bring log to next visit for review again 6. Continue Statin, ACE  Considering Future Mounjaro, if covered and if interested in change. He will check into this now and review next visit.

## 2022-07-02 ENCOUNTER — Encounter: Payer: Self-pay | Admitting: Family Medicine

## 2022-07-02 ENCOUNTER — Ambulatory Visit (INDEPENDENT_AMBULATORY_CARE_PROVIDER_SITE_OTHER): Payer: BC Managed Care – PPO | Admitting: Family Medicine

## 2022-07-02 VITALS — BP 112/70 | HR 80 | Temp 96.9°F | Wt 237.0 lb

## 2022-07-02 DIAGNOSIS — R350 Frequency of micturition: Secondary | ICD-10-CM | POA: Diagnosis not present

## 2022-07-02 DIAGNOSIS — R3915 Urgency of urination: Secondary | ICD-10-CM | POA: Diagnosis not present

## 2022-07-02 LAB — POCT URINALYSIS DIPSTICK
Bilirubin, UA: NEGATIVE
Blood, UA: NEGATIVE
Glucose, UA: POSITIVE — AB
Ketones, UA: NEGATIVE
Leukocytes, UA: NEGATIVE
Nitrite, UA: NEGATIVE
Protein, UA: NEGATIVE
Spec Grav, UA: 1.01 (ref 1.010–1.025)
Urobilinogen, UA: 0.2 E.U./dL
pH, UA: 5 (ref 5.0–8.0)

## 2022-07-02 MED ORDER — CEPHALEXIN 500 MG PO CAPS: 500.0000 mg | ORAL_CAPSULE | Freq: Three times a day (TID) | ORAL | 0 refills | Status: DC

## 2022-07-02 NOTE — Progress Notes (Signed)
Subjective:    Patient ID: Miguel [name], male    DOB: 1970-09-21, 52 y.o.   MRN: [id number]  Miguel [name] is a 52 y.o. male presenting on 07/02/2022 for Urinary Tract Infection  Patient presents for a same day appointment.  HPI  Urinary Dysfunction / Urinary Urgency and Frequency He reports newer change in past few weeks since started on Baclofen regularly along with Gabapentin for muscle spasm cramp. Since his medications were changed few weeks ago,he has experienced abnormal urinary symptoms with urinary frequency, urgency, occasional dysuria. Baclofen less effective, but he has taken it, now stopped He takes Tizanidine AS NEEDED with good results for MS symptoms and pain History of kidney stones He is diabetic and is on Jardiance History of MS, question if neurogenic bladder concern previously Denies any hematuria, nausea vomiting fever back pain      07/01/2022    8:56 AM 03/27/2022    8:09 AM 07/08/2021    8:18 AM  Depression screen PHQ 2/9  Decreased Interest 0 0 0  Down, Depressed, Hopeless 0 0 0  PHQ - 2 Score 0 0 0  Altered sleeping 0 0 0  Tired, decreased energy 3 1 0  Change in appetite 0 0 0  Feeling bad or failure about yourself  0 0 0  Trouble concentrating 0 0 0  Moving slowly or fidgety/restless 0 0 0  Suicidal thoughts 0 0 0  PHQ-9 Score 3 1 0  Difficult doing work/chores Not difficult at all Not difficult at all Not difficult at all    Social History   Tobacco Use   Smoking status: Never   Smokeless tobacco: Never  Substance Use Topics   Alcohol use: No   Drug use: No    Review of Systems Per HPI unless specifically indicated above     Objective:    BP 112/70 (BP Location: Right Arm, Patient Position: Sitting, Cuff Size: Large)   Pulse 80   Temp (!) 96.9 F (36.1 C) (Temporal)   Wt 237 lb (107.5 kg)   SpO2 98%   BMI 34.01 kg/m   Wt Readings from Last 3 Encounters:  07/02/22 237 lb (107.5 kg)  07/01/22 240 lb (108.9 kg)   04/01/22 224 lb (101.6 kg)    Physical Exam Vitals and nursing note reviewed.  Constitutional:      General: He is not in acute distress.    Appearance: Normal appearance. He is well-developed. He is not diaphoretic.     Comments: Well-appearing, comfortable, cooperative  HENT:     Head: Normocephalic and atraumatic.  Eyes:     General:        Right eye: No discharge.        Left eye: No discharge.     Conjunctiva/sclera: Conjunctivae normal.  Cardiovascular:     Rate and Rhythm: Normal rate.  Pulmonary:     Effort: Pulmonary effort is normal.  Skin:    General: Skin is warm and dry.     Findings: No erythema or rash.  Neurological:     Mental Status: He is alert and oriented to person, place, and time.  Psychiatric:        Mood and Affect: Mood normal.        Behavior: Behavior normal.        Thought Content: Thought content normal.     Comments: Well groomed, good eye contact, normal speech and thoughts       Results for  orders placed or performed in visit on 07/02/22  POCT urinalysis dipstick  Result Value Ref Range   Color, UA     Clarity, UA     Glucose, UA Positive (A) Negative   Bilirubin, UA Negative    Ketones, UA Negative    Spec Grav, UA 1.010 1.010 - 1.025   Blood, UA Negative    pH, UA 5.0 5.0 - 8.0   Protein, UA Negative Negative   Urobilinogen, UA 0.2 0.2 or 1.0 E.U./dL   Nitrite, UA Negative    Leukocytes, UA Negative Negative   Appearance     Odor        Assessment & Plan:   Problem List Items Addressed This Visit   None Visit Diagnoses     Urgency of urination    -  Primary   Relevant Medications   cephALEXin (KEFLEX) 500 MG capsule   Other Relevant Orders   POCT urinalysis dipstick (Completed)   Urine Culture   Urinary frequency       Relevant Medications   cephALEXin (KEFLEX) 500 MG capsule   Other Relevant Orders   Urine Culture       Urine dipstick shows only glucose, from the jardiance. No obvious sign of  infection.  I think it is side effect on the Baclofen with urinary dysfunction. Hold Baclofen for now to see if it resolves.  Send urine culture today Printed back up plan / keflex only if infection.  If culture is negative, no treatment If the urine culture shows bacteria / infection, then go pick up the Keflex antibiotic (7 day course), we will let you know.  Future consider Neurogenic Bladder with MS if the bladder is dysfunctional, this can be from spasms of bladder and urgency, or it could be incomplete emptying if the bladder is not contracting or functioning properly. You can discuss with MS doctor and in future may need a Urologist consult.   Meds ordered this encounter  Medications   cephALEXin (KEFLEX) 500 MG capsule    Sig: Take 1 capsule (500 mg total) by mouth 3 (three) times daily. For 7 days    Dispense:  21 capsule    Refill:  0      Follow up plan: Return if symptoms worsen or fail to improve.   Nobie Putnam, [facility] 07/02/2022, 4:14 PM

## 2022-07-02 NOTE — Patient Instructions (Addendum)
Thank you for coming to the office today.  Urine dipstick shows only glucose, from the jardiance. No obvious sign of infection. I think it is side effect on the Baclofen Hold Baclofen for now to see if it resolves. If it does, and the urine culture is clear / normal. You are good to go. If the urine culture shows bacteria / infection, then go pick up the Keflex antibiotic (7 day course), we will let you know.  Future consider Neurogenic Bladder with MS if the bladder is dysfunctional, this can be from spasms of bladder and urgency, or it could be incomplete emptying if the bladder is not contracting or functioning properly.  You can discuss with MS doctor and in future may need a Urologist consult.  Please schedule a Follow-up Appointment to: Return if symptoms worsen or fail to improve.  If you have any other questions or concerns, please feel free to call the office or send a message through [place]. You may also schedule an earlier appointment if necessary.  Additionally, you may be receiving a survey about your experience at our office within a few days to 1 week by e-mail or mail. We value your feedback.  Nobie Putnam, DO [place]

## 2022-07-04 LAB — URINE CULTURE
MICRO NUMBER:: 14570588
Result:: NO GROWTH
SPECIMEN QUALITY:: ADEQUATE

## 2022-08-04 ENCOUNTER — Ambulatory Visit: Payer: BC Managed Care – PPO | Admitting: Pharmacist

## 2022-08-04 DIAGNOSIS — E1169 Type 2 diabetes mellitus with other specified complication: Secondary | ICD-10-CM

## 2022-08-04 MED ORDER — DEXCOM G7 SENSOR MISC: 12 refills | Status: DC

## 2022-08-04 NOTE — Progress Notes (Signed)
08/04/2022 Name: Miguel [name] MRN: [id number] DOB: 1970/09/25  Chief Complaint  Patient presents with   Medication Management    Miguel [name] is a 52 y.o. year old male who presented for a telephone visit.   They were referred to the pharmacist by their PCP for assistance in managing diabetes and medication access.    Subjective:  Care Team: Primary Care Provider: Olin Hauser, DO ; Next Scheduled Visit: 09/30/2022 Pain Management: Dr. Luberta Robertson- [facility]; Next Scheduled Visit: 08/08/2022 Neurology: [facility] Urology: Hollice Espy, [facility]  Medication Access/Adherence  Current Pharmacy:  [facility] [place], [address] [address] Phone: [phone] Fax: [place], [place] - [address] [place] [address] Phone: [phone] Fax: [phone]   Patient reports affordability concerns with their medications: No  Patient reports access/transportation concerns to their pharmacy: No  Patient reports adherence concerns with their medications:  No      Type 2 Diabetes:   Current medications:  Ozempic 2 mg weekly on Sundays  Tresiba FlexTouch 14 units daily in evening Jardiance 25 mg daily with breakfast  Medications tried in the past: metformin (IR GI intolerance/felt ER at tolerated dose was ineffective)  Denies missed doses   Note blood sugar control impacted by receiving steroid infusions and injections: Receives steroid injections ~every 3 months for pain management and steroid infusions every 6 months with multiple sclerosis treatment (Ocrelizumab infusion)   Results from past 14-days for Dexcom G6 Continuous Glucose Meter (CGM) from [facility] Clarity system:     Patient attributes recent elevated readings in part to stress, recent eating  habits and recent steroids (reports receive hydrocortisone injection in his shoulder on 3/15)  Denies symptoms of hypoglycemia   Continues to use CGM as tool to reinforce/aid with dietary choices and improve blood sugar control   Current physical activity: Reports currently in PT twice weekly for shoulder and walking throughout the day at work/at home walking ~20 minutes x 3-4 days/week   Reports continues to work on weight loss Statin therapy: atorvastatin 20 mg daily Patient denies hypoglycemic s/sx including dizziness, shakiness, sweating.  Confirms carries glucose tablets in case of symptoms of hypoglycemia     Objective:  Lab Results  Component Value Date   HGBA1C 7.8 (A) 07/01/2022    Lab Results  Component Value Date   CREATININE 0.76 03/24/2022   BUN 11 03/24/2022   NA 139 03/24/2022   K 4.3 03/24/2022   CL 103 03/24/2022   CO2 26 03/24/2022    Lab Results  Component Value Date   CHOL 138 03/24/2022   HDL 32 (L) 03/24/2022   LDLCALC 77 03/24/2022   TRIG 192 (H) 03/24/2022   CHOLHDL 4.3 03/24/2022   BP Readings from Last 3 Encounters:  07/02/22 112/70  07/01/22 114/80  04/01/22 115/78   Pulse Readings from Last 3 Encounters:  07/02/22 80  07/01/22 89  04/01/22 73     Medications Reviewed Today     Reviewed by Rennis Petty, RPH-CPP (Pharmacist) on 08/04/22 at [place] List Status: <None>   Medication Order Taking? Sig Documenting Provider Last Dose Status Informant  atorvastatin (LIPITOR) 20 MG tablet [id number]  TAKE 1 TABLET(20 MG) BY MOUTH AT BEDTIME Olin Hauser, DO  Active  B-D UF III MINI PEN NEEDLES 31G X 5 MM MISC [id number]  USE ONCE DAILY AS DIRECTED Karamalegos, Devonne Doughty, DO  Active   baclofen (LIORESAL) 10 MG tablet [id number]  Take 1 tablet by mouth 3 (three) times daily as needed. [provider]  Active   cetirizine (ZYRTEC) 10 MG tablet [id number]  Take 10 mg by mouth daily as needed for allergies.  [provider]  Active   Cholecalciferol (D 1000) 1000 units capsule [id number]  Take by mouth.  [provider]  Active            Med Note Barnet Pall, JAMIE A   Thu May 10, 2015  9:44 AM) Received from: [facility]  Continuous Blood Gluc Sensor ([place]) [place] [id number] Yes Use as directed. Apply 1 sensor every 10 days. Olin Hauser, DO  Active   Cyanocobalamin (VITAMIN B-12) 5000 MCG SUBL [id number]  Place under the tongue. [provider]  Active            Med Note Barnet Pall, JAMIE A   Thu May 10, 2015  9:44 AM) Received from: [facility]  dalfampridine 10 [place] TB12 [id number]   [provider]  Active   desonide (DESOWEN) 0.05 % cream [id number]  Apply topically daily as needed. Karamalegos, Devonne Doughty, DO  Active   empagliflozin (JARDIANCE) 25 MG TABS tablet [id number] Yes Take 1 tablet (25 mg total) by mouth daily before breakfast. Olin Hauser, DO Taking Active   gabapentin (NEURONTIN) 300 MG capsule [id number]  Take 300 mg by mouth 3 (three) times daily. [provider]  Active            Med Note Barnet Pall, JAMIE A   Thu May 10, 2015  9:44 AM) Received from: [facility]  glucose blood [facility] ULTRA) test strip [id number]  USE TO CHECK BLOOD SUGAR THREE TIMES DAILY AS DIRECTED Olin Hauser, DO  Active   ibuprofen (ADVIL,MOTRIN) 800 MG tablet [id number]  Take 800 mg by mouth every 6 (six) hours as needed. [provider]  Active            Med Note Barnet Pall, JAMIE A   Thu May 10, 2015  9:44 AM) Received from: [facility]  ipratropium (ATROVENT) 0.06 % nasal spray [id number]  USE 2 SPRAYS IN EACH NOSTRIL FOUR TIMES DAILY FOR UP TO 5 TO 7 DAYS THEN STOP Karamalegos, Devonne Doughty, DO  Active   ketoconazole (NIZORAL) 2 % shampoo [id number]  Apply 1 application topically daily as needed. Olin Hauser, DO  Active   Lancets [facility] ULTRASOFT) lancets [id number]  Check blood sugar three  times daily. Olin Hauser, DO  Active   lisinopril (ZESTRIL) 2.5 MG tablet [id number]  TAKE 1 TABLET(2.5 MG) BY MOUTH DAILY [place], Devonne Doughty, DO  Active   Multiple Vitamins-Minerals (PRESERVISION AREDS PO) [id number]  Take by mouth. [provider]  Active   Ocrelizumab (OCREVUS IV) [id number]  Inject into the vein. [provider]  Active   OZEMPIC, 2 MG/DOSE, 8 MG/3ML SOPN [id number] Yes INJECT 2 MG UNDER THE SKIN ONE DAY A WEEK Karamalegos, Alexander Lenna Sciara, DO Taking Active   tiZANidine (ZANAFLEX) 4 MG tablet [id number]  TAKE 1/2 TO 1 TABLET(2 TO 4 MG) BY MOUTH EVERY 8 HOURS AS NEEDED FOR MUSCLE SPASMS Karamalegos, Devonne Doughty, DO  Active   traMADol (ULTRAM) 50 MG tablet [id number]  Take 50 mg by mouth daily as needed. [provider]  Active   TRESIBA FLEXTOUCH 100 UNIT/ML FlexTouch Pen [id number] Yes ADMINISTER 20 UNITS UNDER THE SKIN DAILY  Patient taking differently: 14 Units daily.   Olin Hauser, DO Taking Active               Assessment/Plan:     Diabetes: - Reviewed long term cardiovascular and renal outcomes of uncontrolled blood sugar - Reviewed goal A1c, goal fasting, and goal 2 hour post prandial glucose - Reviewed dietary modifications including having regular well-balanced meals throughout the day and controlling carbohydrate portion sizes  Advise patient against skipping meals - Recommend to continue to use CGM as tool to reinforce/aid with dietary choices and improve blood sugar control Remind patient to continue to use glucometer to complete fingerstick reading when needed for symptoms/unsure of CGM data or if instructed by CGM device - CPP will send Rx for Dexcom G7 to pharmacy today and plan to follow up regarding coverage/complete PA if needed Patient plans to continue to use smartphone in place of reader device If approved/Dexcom G7 affordable, patient to download Dexcom G7 app and review Dexcom G7 training videos  prior to use - Advise patient he may increase his Tresiba dose to 16 units daily, while continuing current doses of Ozempic and Jardiance, to aid with blood sugar control - Plan to discuss possible switch from Ozempic to [facility] again during our next call  Note Mounjaro now on patient's health plan formulary, but requiring prior authorization  Will follow up regarding back order concerns related to these medications at that time - Review with patient signs of/how to manage hypoglycemia  Patient confirms continues to carry glucose tablets with him - Will collaborate with PCP     Follow Up Plan: Clinic Pharmacist will follow up with patient by telephone on 08/25/2022 at 2:30 PM   Wallace Cullens, PharmD, [facility] [place], [place] [phone]

## 2022-08-04 NOTE — Patient Instructions (Signed)
Goals Addressed             This Visit's Progress    Pharmacy Goals       Our goal A1c is less than 7%. This corresponds with fasting sugars less than 130 and 2 hour after meal sugars less than 180. Please check your blood sugar and keep a log of the results  Our goal bad cholesterol, or LDL, is less than 70 . This is why it is important to continue taking your atorvastatin.  Feel free to call me with any questions or concerns. I look forward to our next call!  Shayli Altemose Odessia Asleson, PharmD, BCACP, CPP Clinical Pharmacist [facility] [phone]        

## 2022-08-06 ENCOUNTER — Ambulatory Visit: Payer: BC Managed Care – PPO | Admitting: Pharmacist

## 2022-08-06 ENCOUNTER — Encounter: Payer: Self-pay | Admitting: Pharmacist

## 2022-08-06 DIAGNOSIS — E1169 Type 2 diabetes mellitus with other specified complication: Secondary | ICD-10-CM

## 2022-08-06 NOTE — Progress Notes (Signed)
   08/06/2022 Name: Miguel [name] MRN: [id number] DOB: April 06, 1971  Chief Complaint  Patient presents with   Medication Management    Dexcom G7    Coordination of Care Call  Outreach to [place] today on behalf of patient. Confirm Dexcom G7 sensor claim successfully billed through patient's plan  Follow up with patient and provide counseling on plan to switch from Dexcom G6 to Dexcom G7 continuous glucose monitor. Will also send patient link to [employer] training videos via MyChart message as requested.   Follow Up Plan: Clinic Pharmacist will follow up with patient by telephone on 08/25/2022 at 2:30 PM   Wallace Cullens, PharmD, Para March, [facility] [place] [phone]

## 2022-08-06 NOTE — Patient Instructions (Signed)
Goals Addressed             This Visit's Progress    Pharmacy Goals       Our goal A1c is less than 7%. This corresponds with fasting sugars less than 130 and 2 hour after meal sugars less than 180. Please check your blood sugar and keep a log of the results  Our goal bad cholesterol, or LDL, is less than 70 . This is why it is important to continue taking your atorvastatin.  Feel free to call me with any questions or concerns. I look forward to our next call!  Lylliana Kitamura Siddhi Dornbush, PharmD, BCACP, CPP Clinical Pharmacist [facility] [place] [phone]        

## 2022-08-25 ENCOUNTER — Ambulatory Visit: Payer: BC Managed Care – PPO | Admitting: Pharmacist

## 2022-08-25 DIAGNOSIS — E1169 Type 2 diabetes mellitus with other specified complication: Secondary | ICD-10-CM

## 2022-08-25 NOTE — Progress Notes (Signed)
08/25/2022 Name: Miguel [name] MRN: [id number] DOB: 20-Apr-1971  Chief Complaint  Patient presents with   Medication Management    Miguel [name] is a 52 y.o. year old male who presented for a telephone visit.   They were referred to the pharmacist by their PCP for assistance in managing diabetes and medication access.    Subjective:  Care Team: Primary Care Provider: Smitty Cords, DO ; Next Scheduled Visit: 09/30/2022 Pain Management: Dr. Pecolia [facility]; Next Scheduled Visit: 11/07/2022 Neurology: [facility] Urology: Vanna Scotland, MD [facility]  Medication Access/Adherence  Current Pharmacy:  [id number] - [place], [place] - [address] AT [facility] [address] [facility] [address] [place] [address] Phone: [phone] Fax: [phone]  [facility] - [place], [place] - [address] [place] [address] Phone: [phone] Fax: [phone]   Patient reports affordability concerns with their medications: No  Patient reports access/transportation concerns to their pharmacy: No  Patient reports adherence concerns with their medications:  No    Type 2 Diabetes:   Current medications:  Ozempic 2 mg weekly on Sundays  Tresiba FlexTouch 16 units daily in evening Jardiance 25 mg daily with breakfast  Medications tried in the past: metformin (IR GI intolerance/felt ER at tolerated dose was ineffective)   Denies missed doses   Note blood sugar control impacted by receiving steroid infusions and injections: Receives steroid injections ~every 3 months for pain management and steroid infusions every 6 months with multiple sclerosis treatment (Ocrelizumab infusion)   Reports doing well with switch from Dexcom G6 to Dexcom G7  Results from past 14-days for Dexcom G7 Continuous Glucose Meter (CGM) from [facility] Clarity system:      Patient attributes  recent elevated readings in part to recent steroid injection on 3/22, significant shoulder pain and recent eating habits - Reports going to have a x-ray of his shoulder today after work   Denies symptoms of hypoglycemia   Continues to use CGM as tool to reinforce/aid with dietary choices and improve blood sugar control   Current physical activity: Reports currently in PT twice weekly for shoulder and walking throughout the day at work/at home walking ~20 minutes x 3-4 days/week   Reports continues to work on weight loss, current home weight ~225 lbs Statin therapy: atorvastatin 20 mg daily Patient denies hypoglycemic s/sx including dizziness, shakiness, sweating.  Confirms carries glucose tablets in case of symptoms of hypoglycemia   Objective:  Lab Results  Component Value Date   HGBA1C 7.8 (A) 07/01/2022    Lab Results  Component Value Date   CREATININE 0.76 03/24/2022   BUN 11 03/24/2022   NA 139 03/24/2022   K 4.3 03/24/2022   CL 103 03/24/2022   CO2 26 03/24/2022    Lab Results  Component Value Date   CHOL 138 03/24/2022   HDL 32 (L) 03/24/2022   LDLCALC 77 03/24/2022   TRIG 192 (H) 03/24/2022   CHOLHDL 4.3 03/24/2022    Medications Reviewed Today     Reviewed by Manuela Neptune, RPH-CPP (Pharmacist) on 08/06/22 at 1019  Med List Status: <None>   Medication Order Taking? Sig Documenting Provider Last Dose Status Informant  atorvastatin (LIPITOR) 20 MG tablet [id number] No TAKE 1 TABLET(20 MG) BY MOUTH AT BEDTIME Karamalegos, [name], DO Taking Active   B-D UF III MINI PEN NEEDLES 31G X 5  MM MISC [id number] No USE ONCE DAILY AS DIRECTED [name], [name], DO Taking Active   baclofen (LIORESAL) 10 MG tablet [id number] No Take 1 tablet by mouth 3 (three) times daily as needed. [provider] Taking Active   cetirizine (ZYRTEC) 10 MG tablet [id number] No Take 10 mg by mouth daily as needed for allergies. [provider] Taking Active    Cholecalciferol (D 1000) 1000 units capsule [id number] No Take by mouth.  [provider] Taking Active            Med Note Francesco Runner, JAMIE A   Thu May 10, 2015  9:44 AM) Received from: [facility]  Continuous Blood Gluc Sensor (DEXCOM G7 SENSOR) [place] [id number]  Use as directed. Apply 1 sensor every 10 days. Karamalegos, [name], DO  Active   Cyanocobalamin (VITAMIN B-12) 5000 MCG SUBL [id number] No Place under the tongue. [provider] Taking Active            Med Note Francesco Runner, JAMIE A   Thu May 10, 2015  9:44 AM) Received from: [facility]  dalfampridine 10 MG TB12 [id number] No  [provider] Taking Active   desonide (DESOWEN) 0.05 % cream [id number] No Apply topically daily as needed. Smitty Cords, DO Taking Active   empagliflozin (JARDIANCE) 25 MG TABS tablet [id number] No Take 1 tablet (25 mg total) by mouth daily before breakfast. Smitty Cords, DO Taking Active   gabapentin (NEURONTIN) 300 MG capsule [id number] No Take 300 mg by mouth 3 (three) times daily. [provider] Taking Active            Med Note Francesco Runner, JAMIE A   Thu May 10, 2015  9:44 AM) Received from: [facility]  glucose blood [facility] ULTRA) test strip [id number] No USE TO CHECK BLOOD SUGAR THREE TIMES DAILY AS DIRECTED Smitty Cords, DO Taking Active   ibuprofen (ADVIL,MOTRIN) 800 MG tablet [id number] No Take 800 mg by mouth every 6 (six) hours as needed. [provider] Taking Active            Med Note Francesco Runner, JAMIE A   Thu May 10, 2015  9:44 AM) Received from: [facility]  ipratropium (ATROVENT) 0.06 % nasal spray [id number] No USE 2 SPRAYS IN EACH NOSTRIL FOUR TIMES DAILY FOR UP TO 5 TO 7 DAYS THEN STOP Karamalegos, [name], DO Taking Active   ketoconazole (NIZORAL) 2 % shampoo [id number] No Apply 1 application topically daily as needed. Smitty Cords, DO Taking Active   Lancets [facility] ULTRASOFT) lancets  [id number] No Check blood sugar three times daily. Smitty Cords, DO Taking Active   lisinopril (ZESTRIL) 2.5 MG tablet [id number] No TAKE 1 TABLET(2.5 MG) BY MOUTH DAILY [name], [name], DO Taking Active   Multiple Vitamins-Minerals (PRESERVISION AREDS PO) [id number] No Take by mouth. [provider] Taking Active   Ocrelizumab (OCREVUS IV) [id number] No Inject into the vein. [provider] Taking Active   OZEMPIC, 2 MG/DOSE, 8 MG/3ML SOPN [id number] No INJECT 2 MG UNDER THE SKIN ONE DAY A WEEK Karamalegos, [name], DO Taking Active   tiZANidine (ZANAFLEX) 4 MG tablet [id number] No TAKE 1/2 TO 1 TABLET(2 TO 4 MG) BY MOUTH EVERY 8 HOURS AS NEEDED FOR MUSCLE SPASMS Karamalegos, [name], DO Taking Active   traMADol (ULTRAM) 50 MG tablet [id number] No Take 50 mg by mouth daily as needed. [provider] Taking Active   TRESIBA FLEXTOUCH 100 UNIT/ML  FlexTouch Pen [id number] No ADMINISTER 20 UNITS UNDER THE SKIN DAILY  Patient taking differently: 16 Units daily.   Smitty CordsKaramalegos, Alexander J, DO Taking Active               Assessment/Plan:   Patient planning to present today for x-ray of his shoulder and plans to work with his provider team on management of shoulder pain  Diabetes: - Reviewed long term cardiovascular and renal outcomes of uncontrolled blood sugar - Assist patient with reconnect CGM data sharing, now for Dexcom G7, with clinic via Clarity App - Reviewed dietary modifications including having regular well-balanced meals throughout the day and controlling carbohydrate portion sizes - Recommend to continue to use CGM as tool to reinforce/aid with dietary choices and improve blood sugar control Patient to continue to use glucometer to complete fingerstick reading when needed for symptoms/unsure of CGM data or if instructed by CGM device - Plan to discuss possible [facility] during our next call as requested              Note Greggory KeenMounjaro now on patient's health plan formulary, but requiring prior authorization             Will follow up regarding back order concerns related to these medications at that time     Follow Up Plan: Clinic Pharmacist will follow up with patient by telephone on 11/03/2022 at 9:15 AM    Estelle GrumblesElisabeth Stephenia Vogan, PharmD, Patsy BaltimoreBCACP, CPP Clinical Pharmacist [facility] [place] [phone]

## 2022-08-25 NOTE — Patient Instructions (Signed)
Goals Addressed             This Visit's Progress    Pharmacy Goals       Our goal A1c is less than 7%. This corresponds with fasting sugars less than 130 and 2 hour after meal sugars less than 180. Please check your blood sugar and keep a log of the results  Our goal bad cholesterol, or LDL, is less than 70 . This is why it is important to continue taking your atorvastatin.  Feel free to call me with any questions or concerns. I look forward to our next call!  Petar Mucci Perlie Stene, PharmD, BCACP, CPP Clinical Pharmacist [facility] [place] [phone]        

## 2022-09-17 ENCOUNTER — Other Ambulatory Visit: Payer: Self-pay | Admitting: Family Medicine

## 2022-09-17 DIAGNOSIS — E783 Hyperchylomicronemia: Secondary | ICD-10-CM

## 2022-09-17 DIAGNOSIS — Z794 Long term (current) use of insulin: Secondary | ICD-10-CM

## 2022-09-17 NOTE — Telephone Encounter (Signed)
Requested Prescriptions  Pending Prescriptions Disp Refills   lisinopril (ZESTRIL) 2.5 MG tablet [Pharmacy Med Name: LISINOPRIL 2.5MG  TABLETS] 90 tablet 0    Sig: TAKE 1 TABLET(2.5 MG) BY MOUTH DAILY     Cardiovascular:  ACE Inhibitors Passed - 09/17/2022  3:11 AM      Passed - Cr in normal range and within 180 days    Creat  Date Value Ref Range Status  03/24/2022 0.76 0.70 - 1.30 mg/dL Final   Creatinine, Urine  Date Value Ref Range Status  03/27/2022 68 20 - 320 mg/dL Final         Passed - K in normal range and within 180 days    Potassium  Date Value Ref Range Status  03/24/2022 4.3 3.5 - 5.3 mmol/L Final         Passed - Patient is not pregnant      Passed - Last BP in normal range    BP Readings from Last 1 Encounters:  07/02/22 112/70         Passed - Valid encounter within last 6 months    Recent Outpatient Visits           3 weeks ago Type 2 diabetes mellitus with other specified complication, with long-term current use of insulin (HCC)   [place] [facility] Delles, [name], RPH-CPP   1 month ago Type 2 diabetes mellitus with other specified complication, with long-term current use of insulin (HCC)   [place] [facility] Delles, [name], RPH-CPP   1 month ago Type 2 diabetes mellitus with other specified complication, with long-term current use of insulin (HCC)   [place] [facility] Delles, [name], RPH-CPP   2 months ago Urgency of urination   [place] [facility], [name], DO   2 months ago Type 2 diabetes mellitus with other specified complication, with long-term current use of insulin (HCC)    [facility] [place], [name], DO       Future Appointments             In 1 week [name], [name], DO  [facility]             atorvastatin (LIPITOR) 20 MG tablet [Pharmacy Med  Name: ATORVASTATIN 20MG  TABLETS] 90 tablet 0    Sig: TAKE 1 TABLET(20 MG) BY MOUTH AT BEDTIME     Cardiovascular:  Antilipid - Statins Failed - 09/17/2022  3:11 AM      Failed - Lipid Panel in normal range within the last 12 months    Cholesterol  Date Value Ref Range Status  03/24/2022 138 <200 mg/dL Final   LDL Cholesterol (Calc)  Date Value Ref Range Status  03/24/2022 77 mg/dL (calc) Final    Comment:    Reference range: <100 . Desirable range <100 mg/dL for primary prevention;   <70 mg/dL for patients with CHD or diabetic patients  with > or = 2 CHD risk factors. Marland Kitchen LDL-C is now calculated using the Martin-Hopkins  calculation, which is a validated novel method providing  better accuracy than the Friedewald equation in the  estimation of LDL-C.  Horald Pollen et al. Lenox Ahr. 1610;960(45): 2061-2068  (http://education.QuestDiagnostics.com/faq/FAQ164)    HDL  Date Value Ref Range Status  03/24/2022 32 (L) > OR = 40 mg/dL Final   Triglycerides  Date Value Ref Range Status  03/24/2022 192 (H) <  150 mg/dL Final         Passed - Patient is not pregnant      Passed - Valid encounter within last 12 months    Recent Outpatient Visits           3 weeks ago Type 2 diabetes mellitus with other specified complication, with long-term current use of insulin (HCC)   [place] [facility] Delles, [name], RPH-CPP   1 month ago Type 2 diabetes mellitus with other specified complication, with long-term current use of insulin (HCC)   [place] [facility] Delles, [name], RPH-CPP   1 month ago Type 2 diabetes mellitus with other specified complication, with long-term current use of insulin [facility])   [place] [facility] Delles, [name], RPH-CPP   2 months ago Urgency of urination   [place] [facility], [name], DO   2 months ago Type 2 diabetes mellitus with other specified complication,  with long-term current use of insulin [facility])   [place] [facility] [place], [name], DO       Future Appointments             In 1 week [name], [name], DO [facility]

## 2022-09-24 ENCOUNTER — Other Ambulatory Visit: Payer: Self-pay | Admitting: Pharmacist

## 2022-09-24 ENCOUNTER — Encounter: Payer: Self-pay | Admitting: Pharmacist

## 2022-09-24 NOTE — Patient Instructions (Addendum)
It was a pleasure speaking with you today. The following is the manufacturer website for the Ampyra that we were discussing:   [email or web address]   Please follow up with manufacturer to see is you may qualify for patient assistance for brand name. Please let me know if you have further questions.   Thank you!   Estelle Grumbles, PharmD, [facility] Clinical Pharmacist [facility] [phone]

## 2022-09-24 NOTE — Progress Notes (Signed)
   09/24/2022  Patient ID: Miguel [name], male   DOB: 12/22/1970, 52 y.o.   MRN: [id number]  Receive a voicemail from patient requesting a call back.  Patient reports Neurologist prescribed him Ampyra ER 10mg . Reports generic, rather than the brand name medication is covered through his plan and that the cost is difficult to afford.  Advise patient to follow up with manufacturer to determine if patient assistance program is available for brand name Ampyra and, if so, if patient would qualify given that the brand name is not covered through his health plan coverage. As requested, send patient link to manufacturer website via MyChart  Follow Up Plan: Clinic Pharmacist will follow up with patient by telephone on 11/03/2022 at 9:15 AM    Estelle Grumbles, PharmD, [place], CPP Clinical Pharmacist [facility] [phone]

## 2022-09-30 ENCOUNTER — Encounter: Payer: Self-pay | Admitting: Family Medicine

## 2022-09-30 ENCOUNTER — Ambulatory Visit: Payer: BC Managed Care – PPO | Admitting: Family Medicine

## 2022-09-30 VITALS — BP 112/68 | HR 85 | Ht 70.0 in | Wt 229.0 lb

## 2022-09-30 DIAGNOSIS — M62830 Muscle spasm of back: Secondary | ICD-10-CM

## 2022-09-30 DIAGNOSIS — Z794 Long term (current) use of insulin: Secondary | ICD-10-CM | POA: Diagnosis not present

## 2022-09-30 DIAGNOSIS — E1169 Type 2 diabetes mellitus with other specified complication: Secondary | ICD-10-CM | POA: Diagnosis not present

## 2022-09-30 DIAGNOSIS — M47814 Spondylosis without myelopathy or radiculopathy, thoracic region: Secondary | ICD-10-CM | POA: Diagnosis not present

## 2022-09-30 LAB — POCT GLYCOSYLATED HEMOGLOBIN (HGB A1C): Hemoglobin A1C: 6.8 % — AB (ref 4.0–5.6)

## 2022-09-30 MED ORDER — TIZANIDINE HCL 4 MG PO TABS: ORAL_TABLET | ORAL | 3 refills | Status: DC

## 2022-09-30 NOTE — Patient Instructions (Addendum)
Thank you for coming to the office today.  Recent Labs    03/24/22 0818 07/01/22 0849 09/30/22 0828  HGBA1C 7.5* 7.8* 6.8*   Refilled Tizanidine  Keep on Ozempic as you are, no change yet. Future reconsider Mounjaro.  ------------------  I can refer to [facility] for Colonoscopy screening, please check into options for testing / fees etc.  [facility]) [address] [place], [place] [address] Phone: [phone]  Dr Midge Minium [facility])   [facility]) [address] [place], [place] [address] Main: [phone]  -------------------------------  [facility] [address] [place], [place] [address] Hours: 8AM-5PM Phone: [phone]  -----------------------------------------------------------  DUE for FASTING BLOOD WORK (no food or drink after midnight before the lab appointment, only water or coffee without cream/sugar on the morning of)  SCHEDULE "Lab Only" visit in the morning at the clinic for lab draw in 6 MONTHS   - Make sure Lab Only appointment is at about 1 week before your next appointment, so that results will be available  For Lab Results, once available within 2-3 days of blood draw, you can can log in to MyChart online to view your results and a brief explanation. Also, we can discuss results at next follow-up visit.     Please schedule a Follow-up Appointment to: Return in about 6 months (around 04/02/2023) for 6 month fasting lab only then 1 week later Annual Physical.  If you have any other questions or concerns, please feel free to call the office or send a message through MyChart. You may also schedule an earlier appointment if necessary.  Additionally, you may be receiving a survey about your experience at our office within a few days to 1 week by e-mail or mail. We value your feedback.  Saralyn Pilar, DO [facility], [place]

## 2022-09-30 NOTE — Progress Notes (Unsigned)
Subjective:    Patient ID: Miguel [name], male    DOB: 09-28-1970, 52 y.o.   MRN: [id number]  Miguel [name] is a 52 y.o. male presenting on 09/30/2022 for Diabetes   HPI  FOLLOW-UP CHRONIC DM, Type 2: See prior notes for background information. Dexcom G7 CGM reviewed data, Avg currently inaccurate since was not able to wear sensor for period of time and also had imaging so could not wear. Previously Avg 150-160s. But has improved now. Improving. Due for A1c today, prior 7.8   Continues Ozempic 2mg  weekly inj, Jardiance 25mg  daily - He is checking into [facility] for future if interested, but not available right now. Insulin Tresiba 14 units - Remains OFF Metformin Future consideration of Mounjaro but insurance not approving or covering it yet. Currently on ACEi Lifestyle: Weight down to 222 lbs on his home scale without boots, down 10-15 lbs in past 3 months Improving Diet. - Exercise - at gym at therapy 2 x week - Last DM Eye 2023 already 09/2021 Admits some constipation on Ozempic. But improving bowel function few days after dose. Denies hypoglycemia   Talking with Estelle Grumbles [facility] CPP for CCM Pharmacy Upgraded DexCom G7 Sensor, waiting for new start. Has limited readings today   HYPERLIPIDEMIA: Currently taking Atorvastatin 20mg , tolerating well without side effects or myalgias   Chronic Neuropathic Pain / Back Pain / Multiple Sclerosis Followed by [facility]. See outside record for details. Continues on disease modifying therapy infusion every few months, rarely has flare up requiring bag steroid infusion. - Taking Gabapentin, Tizanidine, Tramadol - temporary relief - Has been receiving spinal injections as well - Significant improvement still on Tizanidine 2-4mg  dosage PRN with great results to help pain. Takes it early to prevent. Neurology recommending taking Baclofen instead at times, and he will use one or the other, but takes  Tizanidine.  Updates with new medication from Neurology not filled yet - Ampyra (Dalfampridine) 10mg  TB12 for MS - Also on Dextroamphetamine 20mg  XR for inattention and has significantly helped his focus and function - On Gabapentin 300mg  x 3 = 900mg  THREE TIMES A DAY he is tapering down.   History of Low Testosterone Has been on HRT years ago. Now with Urology, last lab result 364, was advised normal range. He may try supplement.   OSA on CPAP Currently on CPAP Patient reports prior history of dx OSA and on CPAP - Today reports that sleep apnea is well controlled. He uses the CPAP machine every night. Tolerates the machine well, and thinks that sleeps better with it and feels good. No new concerns or symptoms.      09/30/2022    8:15 AM 07/01/2022    8:56 AM 03/27/2022    8:09 AM  Depression screen PHQ 2/9  Decreased Interest 0 0 0  Down, Depressed, Hopeless 0 0 0  PHQ - 2 Score 0 0 0  Altered sleeping  0 0  Tired, decreased energy  3 1  Change in appetite  0 0  Feeling bad or failure about yourself   0 0  Trouble concentrating  0 0  Moving slowly or fidgety/restless  0 0  Suicidal thoughts  0 0  PHQ-9 Score  3 1  Difficult doing work/chores  Not difficult at all Not difficult at all    Social History   Tobacco Use   Smoking status: Never   Smokeless tobacco: Never  Substance Use Topics   Alcohol use: No  Drug use: No    Review of Systems Per HPI unless specifically indicated above     Objective:    BP 112/68   Pulse 85   Ht 5\' 10"  (1.778 m)   Wt 229 lb (103.9 kg)   SpO2 100%   BMI 32.86 kg/m   Wt Readings from Last 3 Encounters:  09/30/22 229 lb (103.9 kg)  07/02/22 237 lb (107.5 kg)  07/01/22 240 lb (108.9 kg)    Physical Exam Vitals and nursing note reviewed.  Constitutional:      General: He is not in acute distress.    Appearance: He is well-developed. He is not diaphoretic.     Comments: Well-appearing, comfortable, cooperative  HENT:      Head: Normocephalic and atraumatic.  Eyes:     General:        Right eye: No discharge.        Left eye: No discharge.     Conjunctiva/sclera: Conjunctivae normal.  Neck:     Thyroid: No thyromegaly.  Cardiovascular:     Rate and Rhythm: Normal rate and regular rhythm.     Pulses: Normal pulses.     Heart sounds: Normal heart sounds. No murmur heard. Pulmonary:     Effort: Pulmonary effort is normal. No respiratory distress.     Breath sounds: Normal breath sounds. No wheezing or rales.  Musculoskeletal:        General: Normal range of motion.     Cervical back: Normal range of motion and neck supple.  Lymphadenopathy:     Cervical: No cervical adenopathy.  Skin:    General: Skin is warm and dry.     Findings: No erythema or rash.  Neurological:     Mental Status: He is alert and oriented to person, place, and time. Mental status is at baseline.  Psychiatric:        Behavior: Behavior normal.     Comments: Well groomed, good eye contact, normal speech and thoughts    Results for orders placed or performed in visit on 09/30/22  POCT HgB A1C  Result Value Ref Range   Hemoglobin A1C 6.8 (A) 4.0 - 5.6 %      Assessment & Plan:   Problem List Items Addressed This Visit     Type 2 diabetes mellitus with other specified complication (HCC) - Primary    A1c improved to 6.8 CGM has improved his control overall On DexCOM G7  Steroid courses PRN for MS No evidence of hypoglycemia on GLP and Insulin Failed Metformin 1000mg  BID (GI intolerance), XR was ineffective Complications - other including dyslipidemia with low HDL and history of hypertriglyceridemia, obesity, OSA - increases risk of future cardiovascular complications / poor glucose control due to OSA  Weight is down  Plan:  1. Continue Ozempic 2mg  weekly inj 2. Continue jardiance 25mg  daily 3. Continue insulin Tresiba 12-16 units daily, has lowered and continues to adjust 4. Encourage improved lifestyle - low carb,  low sugar diet, reduce portion size, continue improving regular exercise 5. Check CBG, bring log to next visit for review again 6. Continue Statin, ACE  Considering Future Mounjaro if indicated.      Relevant Orders   POCT HgB A1C (Completed)   Other Visit Diagnoses     Spondylosis of thoracic region without myelopathy or radiculopathy       Relevant Medications   tiZANidine (ZANAFLEX) 4 MG tablet   Spasm of thoracic back muscle  Relevant Medications   tiZANidine (ZANAFLEX) 4 MG tablet       MS / Back Pain Arthritis Continue current therapy including Tizanidine AS NEEDED He follows Neurology and they are managing MS with medication therapy He has baclofen if need as well, does not take together  Meds ordered this encounter  Medications   tiZANidine (ZANAFLEX) 4 MG tablet    Sig: TAKE 1/2 TO 1 TABLET(2 TO 4 MG) BY MOUTH EVERY 8 HOURS AS NEEDED FOR MUSCLE SPASMS    Dispense:  60 tablet    Refill:  3      Follow up plan: Return in about 6 months (around 04/02/2023) for 6 month fasting lab only then 1 week later Annual Physical.  Future labs ordered for 03/31/23   Saralyn Pilar, DO [facility] 09/30/2022, 8:18 AM

## 2022-10-01 ENCOUNTER — Other Ambulatory Visit: Payer: Self-pay | Admitting: Family Medicine

## 2022-10-01 ENCOUNTER — Encounter: Payer: Self-pay | Admitting: Family Medicine

## 2022-10-01 DIAGNOSIS — G35 Multiple sclerosis: Secondary | ICD-10-CM

## 2022-10-01 DIAGNOSIS — G4733 Obstructive sleep apnea (adult) (pediatric): Secondary | ICD-10-CM

## 2022-10-01 DIAGNOSIS — Z125 Encounter for screening for malignant neoplasm of prostate: Secondary | ICD-10-CM

## 2022-10-01 DIAGNOSIS — E785 Hyperlipidemia, unspecified: Secondary | ICD-10-CM

## 2022-10-01 DIAGNOSIS — Z0000 Encounter for general adult medical examination without abnormal findings: Secondary | ICD-10-CM

## 2022-10-01 DIAGNOSIS — Z794 Long term (current) use of insulin: Secondary | ICD-10-CM

## 2022-10-01 NOTE — Assessment & Plan Note (Signed)
A1c improved to 6.8 CGM has improved his control overall On DexCOM G7  Steroid courses PRN for MS No evidence of hypoglycemia on GLP and Insulin Failed Metformin 1000mg  BID (GI intolerance), XR was ineffective Complications - other including dyslipidemia with low HDL and history of hypertriglyceridemia, obesity, OSA - increases risk of future cardiovascular complications / poor glucose control due to OSA  Weight is down  Plan:  1. Continue Ozempic 2mg  weekly inj 2. Continue jardiance 25mg  daily 3. Continue insulin Tresiba 12-16 units daily, has lowered and continues to adjust 4. Encourage improved lifestyle - low carb, low sugar diet, reduce portion size, continue improving regular exercise 5. Check CBG, bring log to next visit for review again 6. Continue Statin, ACE  Considering Future Mounjaro if indicated.

## 2022-10-08 ENCOUNTER — Other Ambulatory Visit: Payer: Self-pay | Admitting: Family Medicine

## 2022-10-08 DIAGNOSIS — Z794 Long term (current) use of insulin: Secondary | ICD-10-CM

## 2022-10-09 NOTE — Telephone Encounter (Signed)
Requested Prescriptions  Pending Prescriptions Disp Refills   OZEMPIC, 2 MG/DOSE, 8 MG/3ML SOPN [Pharmacy Med Name: OZEMPIC 2MG  PER DOSE (8MG /3ML) PFP] 9 mL 1    Sig: INJECT 2MG  UNDER THE SKIN ONCE A WEEK.     Endocrinology:  Diabetes - GLP-1 Receptor Agonists - semaglutide Failed - 10/08/2022  7:28 PM      Failed - HBA1C in normal range and within 180 days    Hemoglobin A1C  Date Value Ref Range Status  09/30/2022 6.8 (A) 4.0 - 5.6 % Final   Hgb A1c MFr Bld  Date Value Ref Range Status  03/24/2022 7.5 (H) <5.7 % of total Hgb Final    Comment:    For someone without known diabetes, a hemoglobin A1c value of 6.5% or greater indicates that they may have  diabetes and this should be confirmed with a follow-up  test. . For someone with known diabetes, a value <7% indicates  that their diabetes is well controlled and a value  greater than or equal to 7% indicates suboptimal  control. A1c targets should be individualized based on  duration of diabetes, age, comorbid conditions, and  other considerations. . Currently, no consensus exists regarding use of hemoglobin A1c for diagnosis of diabetes for children. .          Passed - Cr in normal range and within 360 days    Creat  Date Value Ref Range Status  03/24/2022 0.76 0.70 - 1.30 mg/dL Final   Creatinine, Urine  Date Value Ref Range Status  03/27/2022 68 20 - 320 mg/dL Final         Passed - Valid encounter within last 6 months    Recent Outpatient Visits           1 week ago Type 2 diabetes mellitus with other specified complication, with long-term current use of insulin [facility])   [place] [place], [name], DO   1 month ago Type 2 diabetes mellitus with other specified complication, with long-term current use of insulin (HCC)   [place] [facility] Delles, [name], RPH-CPP   2 months ago Type 2 diabetes mellitus with other specified complication, with long-term  current use of insulin [facility])   [place] [facility] Delles, [name], RPH-CPP   2 months ago Type 2 diabetes mellitus with other specified complication, with long-term current use of insulin [facility])   [place] Delles, [name], RPH-CPP   3 months ago Urgency of urination   [facility] [place], [name], DO       Future Appointments             In 6 months [name], [name], DO [place] [facility]

## 2022-10-22 LAB — HM DIABETES EYE EXAM

## 2022-11-03 ENCOUNTER — Ambulatory Visit: Payer: BC Managed Care – PPO | Admitting: Pharmacist

## 2022-11-03 DIAGNOSIS — E785 Hyperlipidemia, unspecified: Secondary | ICD-10-CM

## 2022-11-03 DIAGNOSIS — Z794 Long term (current) use of insulin: Secondary | ICD-10-CM

## 2022-11-03 MED ORDER — ATORVASTATIN CALCIUM 20 MG PO TABS
20.0000 mg | ORAL_TABLET | Freq: Every day | ORAL | 0 refills | Status: DC
Start: 2022-11-03 — End: 2023-01-07

## 2022-11-03 NOTE — Patient Instructions (Signed)
Goals Addressed             This Visit's Progress    Pharmacy Goals       Our goal A1c is less than 7%. This corresponds with fasting sugars less than 130 and 2 hour after meal sugars less than 180. Please check your blood sugar and keep a log of the results  Our goal bad cholesterol, or LDL, is less than 70 . This is why it is important to continue taking your atorvastatin.  Feel free to call me with any questions or concerns. I look forward to our next call!  Miguel [name], PharmD, BCACP, CPP Clinical Pharmacist [facility] [phone]        

## 2022-11-03 NOTE — Progress Notes (Signed)
11/03/2022 Name: Miguel [name] MRN: [id number] DOB: 11-01-1970  Chief Complaint  Patient presents with   Medication Management    Miguel [name] is a 52 y.o. year old male who presented for a telephone visit.   They were referred to the pharmacist by their PCP for assistance in managing diabetes and medication access.      Subjective:   Care Team: Primary Care Provider: Smitty Cords, DO ; Next Scheduled Visit: 09/30/2022 Pain Management: Dr. Pecolia [facility]; Next Scheduled Visit: 11/07/2022 Neurology: [facility] Urology: Vanna Scotland, MD [facility]  Medication Access/Adherence  Current Pharmacy:  [id number] - [place], [place] - [address] AT [facility] [address] [facility] [address] [place] [address] Phone: [phone] Fax: [phone]  [facility] - [place], [place] - [address] [place] [address] Phone: [phone] Fax: [phone]   Patient reports affordability concerns with their medications: No  Patient reports access/transportation concerns to their pharmacy: No  Patient reports adherence concerns with their medications:  No      Type 2 Diabetes:   Current medications:  Ozempic 2 mg weekly on Sundays  Tresiba FlexTouch 16 units daily in evening Jardiance 25 mg daily with breakfast  Medications tried in the past: metformin (IR GI intolerance/felt ER at tolerated dose was ineffective)   Denies missed doses   Note blood sugar control impacted by receiving steroid infusions and injections: Receives steroid injections ~every 3 months for pain management and steroid infusions every 6 months with multiple sclerosis treatment (Ocrelizumab infusion)   Patient uses Dexcom G7 Continuous Glucose Monitoring (CGM), but reports recently had CGM off in anticipation of needing MRI on his shoulder - Restarted CGM last night; fasting  morning blood sugar today: 118 - Confirms has glucometer to use as needed for fingerstick checks of blood sugar   Uses CGM as tool to reinforce/aid with dietary choices and improve blood sugar control   Current physical activity: Reports completed PT shoulder. Walking throughout the day at work/at home walking ~20 minutes x 3-4 days/week   Reports continues to work on weight loss, current home weight ~220 lbs  Statin therapy: atorvastatin 20 mg daily  Patient denies hypoglycemic s/sx including dizziness, shakiness, sweating.  Confirms carries glucose tablets in case of symptoms of hypoglycemia    Objective:  Lab Results  Component Value Date   HGBA1C 6.8 (A) 09/30/2022    Lab Results  Component Value Date   CREATININE 0.76 03/24/2022   BUN 11 03/24/2022   NA 139 03/24/2022   K 4.3 03/24/2022   CL 103 03/24/2022   CO2 26 03/24/2022    Lab Results  Component Value Date   CHOL 138 03/24/2022   HDL 32 (L) 03/24/2022   LDLCALC 77 03/24/2022   TRIG 192 (H) 03/24/2022   CHOLHDL 4.3 03/24/2022   BP Readings from Last 3 Encounters:  09/30/22 112/68  07/02/22 112/70  07/01/22 114/80   Pulse Readings from Last 3 Encounters:  09/30/22 85  07/02/22 80  07/01/22 89     Medications Reviewed Today     Reviewed by Manuela Neptune, RPH-CPP (Pharmacist) on 11/03/22 at [phone]  Med List Status: <None>   Medication Order Taking? Sig Documenting Provider Last Dose Status Informant  amphetamine-dextroamphetamine (ADDERALL) 20 MG tablet [id number]  Take 20 mg by mouth daily. [provider]  Active  atorvastatin (LIPITOR) 20 MG tablet [id number]  Take 1 tablet (20 mg total) by mouth daily. Smitty Cords, DO  Active   B-D UF III MINI PEN NEEDLES 31G X 5 MM MISC [id number]  USE ONCE DAILY AS DIRECTED [name], [name], DO  Active   baclofen (LIORESAL) 10 MG tablet [id number]  Take 1 tablet by mouth 3 (three) times daily as needed. [provider]   Active   cetirizine (ZYRTEC) 10 MG tablet [id number]  Take 10 mg by mouth daily as needed for allergies. [provider]  Active   Cholecalciferol (D 1000) 1000 units capsule [id number]  Take by mouth.  [provider]  Active            Med Note Francesco Runner, JAMIE A   Thu May 10, 2015  9:44 AM) Received from: [facility]  Continuous Blood Gluc Sensor (DEXCOM G7 SENSOR) [place] [id number]  Use as directed. Apply 1 sensor every 10 days. Smitty Cords, DO  Active   Cyanocobalamin (VITAMIN B-12) 5000 MCG SUBL [id number]  Place under the tongue. [provider]  Active            Med Note Francesco Runner, JAMIE A   Thu May 10, 2015  9:44 AM) Received from: [facility]  dalfampridine 10 [place] VQ25 [id number]   [provider]  Active   desonide (DESOWEN) 0.05 % cream [id number]  Apply topically daily as needed. Karamalegos, [name], DO  Active   empagliflozin (JARDIANCE) 25 MG TABS tablet [id number] Yes Take 1 tablet (25 mg total) by mouth daily before breakfast. Smitty Cords, DO Taking Active   gabapentin (NEURONTIN) 300 MG capsule [id number]  Take 300 mg by mouth 3 (three) times daily. [provider]  Active            Med Note Francesco Runner, JAMIE A   Thu May 10, 2015  9:44 AM) Received from: [facility]  glucose blood [facility] ULTRA) test strip [id number]  USE TO CHECK BLOOD SUGAR THREE TIMES DAILY AS DIRECTED Smitty Cords, DO  Active   ibuprofen (ADVIL,MOTRIN) 800 MG tablet [id number]  Take 800 mg by mouth every 6 (six) hours as needed. [provider]  Active            Med Note Francesco Runner, JAMIE A   Thu May 10, 2015  9:44 AM) Received from: [facility]  ipratropium (ATROVENT) 0.06 % nasal spray [id number]  USE 2 SPRAYS IN EACH NOSTRIL FOUR TIMES DAILY FOR UP TO 5 TO 7 DAYS THEN STOP Karamalegos, [name], DO  Active   ketoconazole (NIZORAL) 2 % shampoo [id number]  Apply 1 application topically daily as needed.  Smitty Cords, DO  Active   Lancets [facility] ULTRASOFT) lancets [id number]  Check blood sugar three times daily. Smitty Cords, DO  Active   lisinopril (ZESTRIL) 2.5 MG tablet [id number]  TAKE 1 TABLET(2.5 MG) BY MOUTH DAILY [name], [name], DO  Active   Multiple Vitamins-Minerals (PRESERVISION AREDS PO) [id number]  Take by mouth. [provider]  Active   Ocrelizumab (OCREVUS IV) [id number]  Inject into the vein. [provider]  Active   OZEMPIC, 2 MG/DOSE, 8 MG/3ML SOPN [id number] Yes INJECT 2MG  UNDER THE SKIN ONCE A WEEK. Smitty Cords, DO Taking Active   tiZANidine (ZANAFLEX) 4 MG tablet [id number]  TAKE 1/2 TO 1 TABLET(2 TO 4 MG) BY MOUTH EVERY 8 HOURS AS NEEDED FOR MUSCLE SPASMS Karamalegos, [name],  DO  Active   traMADol (ULTRAM) 50 MG tablet [id number]  Take 50 mg by mouth daily as needed. [provider]  Active   TRESIBA FLEXTOUCH 100 UNIT/ML FlexTouch Pen [id number] Yes ADMINISTER 20 UNITS UNDER THE SKIN DAILY  Patient taking differently: 16 Units daily.   Smitty Cords, DO Taking Active               Assessment/Plan:   Diabetes: - Controlled - Reviewed long term cardiovascular and renal outcomes of uncontrolled blood sugar - Reviewed dietary modifications including having regular well-balanced meals throughout the day and controlling carbohydrate portion sizes - Recommend to continue to use CGM as tool to reinforce/aid with dietary choices and improve blood sugar control Patient to continue to use glucometer to complete fingerstick reading when needed for symptoms/unsure of CGM data or if instructed by CGM device - Have reviewed symptoms of/how to manage low blood sugar. Encourage patient to continue to carry glucose tablets - CPP will send renewal of atorvastatin to pharmacy for patient as requested as current prescription out of refills     Follow Up Plan: Clinic Pharmacist will follow up with  patient by telephone on 01/26/2023 at 9:15 am   Estelle Grumbles, PharmD, [place]y [place], CPP Clinical Pharmacist [facility] [phone]

## 2022-11-27 ENCOUNTER — Other Ambulatory Visit: Payer: Self-pay | Admitting: Family Medicine

## 2022-11-27 DIAGNOSIS — E1169 Type 2 diabetes mellitus with other specified complication: Secondary | ICD-10-CM

## 2022-11-28 NOTE — Telephone Encounter (Signed)
Requested Prescriptions  Pending Prescriptions Disp Refills   empagliflozin (JARDIANCE) 25 MG TABS tablet [Pharmacy Med Name: JARDIANCE 25MG  TABLETS] 90 tablet 1    Sig: TAKE 1 TABLET(25 MG) BY MOUTH DAILY BEFORE BREAKFAST     Endocrinology:  Diabetes - SGLT2 Inhibitors Passed - 11/27/2022  7:30 PM      Passed - Cr in normal range and within 360 days    Creat  Date Value Ref Range Status  03/24/2022 0.76 0.70 - 1.30 mg/dL Final   Creatinine, Urine  Date Value Ref Range Status  03/27/2022 68 20 - 320 mg/dL Final         Passed - HBA1C is between 0 and 7.9 and within 180 days    Hemoglobin A1C  Date Value Ref Range Status  09/30/2022 6.8 (A) 4.0 - 5.6 % Final   Hgb A1c MFr Bld  Date Value Ref Range Status  03/24/2022 7.5 (H) <5.7 % of total Hgb Final    Comment:    For someone without known diabetes, a hemoglobin A1c value of 6.5% or greater indicates that they may have  diabetes and this should be confirmed with a follow-up  test. . For someone with known diabetes, a value <7% indicates  that their diabetes is well controlled and a value  greater than or equal to 7% indicates suboptimal  control. A1c targets should be individualized based on  duration of diabetes, age, comorbid conditions, and  other considerations. . Currently, no consensus exists regarding use of hemoglobin A1c for diagnosis of diabetes for children. .          Passed - eGFR in normal range and within 360 days    GFR, Est African American  Date Value Ref Range Status  03/15/2020 125 > OR = 60 mL/min/1.37m2 Final   GFR, Est Non African American  Date Value Ref Range Status  03/15/2020 108 > OR = 60 mL/min/1.4m2 Final   eGFR  Date Value Ref Range Status  03/24/2022 109 > OR = 60 mL/min/1.32m2 Final         Passed - Valid encounter within last 6 months    Recent Outpatient Visits           3 weeks ago Hyperlipidemia associated with type 2 diabetes mellitus (HCC)   [place] [facility] Delles, [name], RPH-CPP   1 month ago Type 2 diabetes mellitus with other specified complication, with long-term current use of insulin [facility])   [place] [facility] [facility] [place], [name], DO   3 months ago Type 2 diabetes mellitus with other specified complication, with long-term current use of insulin (HCC)   [place] [facility] Delles, [name], RPH-CPP   3 months ago Type 2 diabetes mellitus with other specified complication, with long-term current use of insulin [facility])   [place] [facility] Delles, [name], RPH-CPP   3 months ago Type 2 diabetes mellitus with other specified complication, with long-term current use of insulin (HCC)   [place] [facility] Delles, [name], RPH-CPP       Future Appointments             In 4 months [name], [name], DO  [facility]

## 2022-12-04 ENCOUNTER — Encounter: Payer: Self-pay | Admitting: Family Medicine

## 2022-12-04 DIAGNOSIS — Z1211 Encounter for screening for malignant neoplasm of colon: Secondary | ICD-10-CM

## 2022-12-16 ENCOUNTER — Encounter: Payer: Self-pay | Admitting: *Deleted

## 2022-12-29 ENCOUNTER — Other Ambulatory Visit: Payer: Self-pay | Admitting: Family Medicine

## 2022-12-29 DIAGNOSIS — Z794 Long term (current) use of insulin: Secondary | ICD-10-CM

## 2023-01-05 ENCOUNTER — Other Ambulatory Visit: Payer: Self-pay | Admitting: Family Medicine

## 2023-01-05 ENCOUNTER — Encounter: Payer: Self-pay | Admitting: Family Medicine

## 2023-01-05 DIAGNOSIS — E0800 Diabetes mellitus due to underlying condition with hyperosmolarity without nonketotic hyperglycemic-hyperosmolar coma (NKHHC): Secondary | ICD-10-CM

## 2023-01-05 DIAGNOSIS — Z794 Long term (current) use of insulin: Secondary | ICD-10-CM

## 2023-01-05 MED ORDER — BD PEN NEEDLE MINI U/F 31G X 5 MM MISC
3 refills | Status: AC
Start: 2023-01-05 — End: ?

## 2023-01-06 ENCOUNTER — Other Ambulatory Visit: Payer: Self-pay | Admitting: Family Medicine

## 2023-01-06 DIAGNOSIS — E1169 Type 2 diabetes mellitus with other specified complication: Secondary | ICD-10-CM

## 2023-01-07 NOTE — Telephone Encounter (Signed)
Requested Prescriptions  Pending Prescriptions Disp Refills   lisinopril (ZESTRIL) 2.5 MG tablet [Pharmacy Med Name: LISINOPRIL 2.5MG  TABLETS] 90 tablet 0    Sig: TAKE 1 TABLET(2.5 MG) BY MOUTH DAILY     Cardiovascular:  ACE Inhibitors Failed - 01/05/2023  7:40 PM      Failed - Cr in normal range and within 180 days    Creat  Date Value Ref Range Status  03/24/2022 0.76 0.70 - 1.30 mg/dL Final   Creatinine, Urine  Date Value Ref Range Status  03/27/2022 68 20 - 320 mg/dL Final         Failed - K in normal range and within 180 days    Potassium  Date Value Ref Range Status  03/24/2022 4.3 3.5 - 5.3 mmol/L Final         Passed - Patient is not pregnant      Passed - Last BP in normal range    BP Readings from Last 1 Encounters:  09/30/22 112/68         Passed - Valid encounter within last 6 months    Recent Outpatient Visits           2 months ago Hyperlipidemia associated with type 2 diabetes mellitus [facility])   [facility] Delles, [name], RPH-CPP   3 months ago Type 2 diabetes mellitus with other specified complication, with long-term current use of insulin [facility])   [facility] [facility] [place], [name], DO   4 months ago Type 2 diabetes mellitus with other specified complication, with long-term current use of insulin (HCC)   [place] [facility] Delles, [name], RPH-CPP   5 months ago Type 2 diabetes mellitus with other specified complication, with long-term current use of insulin [facility])   [place] [facility] Delles, [name], RPH-CPP   5 months ago Type 2 diabetes mellitus with other specified complication, with long-term current use of insulin (HCC)   [place] [facility] Delles, [name], RPH-CPP       Future Appointments             In 3 months [name], [name], DO [place] [facility], [facility]

## 2023-01-07 NOTE — Telephone Encounter (Signed)
Requested Prescriptions  Pending Prescriptions Disp Refills   atorvastatin (LIPITOR) 20 MG tablet [Pharmacy Med Name: ATORVASTATIN 20MG  TABLETS] 90 tablet 0    Sig: TAKE 1 TABLET(20 MG) BY MOUTH AT BEDTIME     Cardiovascular:  Antilipid - Statins Failed - 01/06/2023  3:12 AM      Failed - Lipid Panel in normal range within the last 12 months    Cholesterol  Date Value Ref Range Status  03/24/2022 138 <200 mg/dL Final   LDL Cholesterol (Calc)  Date Value Ref Range Status  03/24/2022 77 mg/dL (calc) Final    Comment:    Reference range: <100 . Desirable range <100 mg/dL for primary prevention;   <70 mg/dL for patients with CHD or diabetic patients  with > or = 2 CHD risk factors. Marland Kitchen LDL-C is now calculated using the Martin-Hopkins  calculation, which is a validated novel method providing  better accuracy than the Friedewald equation in the  estimation of LDL-C.  Horald Pollen et al. Lenox Ahr. 1610;960(45): 2061-2068  (http://education.Quest[employer].com/faq/FAQ164)    HDL  Date Value Ref Range Status  03/24/2022 32 (L) > OR = 40 mg/dL Final   Triglycerides  Date Value Ref Range Status  03/24/2022 192 (H) <150 mg/dL Final         Passed - Patient is not pregnant      Passed - Valid encounter within last 12 months    Recent Outpatient Visits           2 months ago Hyperlipidemia associated with type 2 diabetes mellitus [facility])   [place] [facility] Delles, [name], RPH-CPP   3 months ago Type 2 diabetes mellitus with other specified complication, with long-term current use of insulin [facility])   [place] [facility] [place], [name], DO   4 months ago Type 2 diabetes mellitus with other specified complication, with long-term current use of insulin (HCC)   [place] [facility] Delles, [name], RPH-CPP   5 months ago Type 2 diabetes mellitus with other specified complication, with long-term current use of  insulin [facility])   [place] [facility] Delles, [name], RPH-CPP   5 months ago Type 2 diabetes mellitus with other specified complication, with long-term current use of insulin (HCC)   [place] [facility] Delles, [name], RPH-CPP       Future Appointments             In 3 months [name], [name], DO [place] [facility], [facility]

## 2023-01-12 ENCOUNTER — Encounter: Payer: Self-pay | Admitting: Family Medicine

## 2023-01-12 DIAGNOSIS — Z794 Long term (current) use of insulin: Secondary | ICD-10-CM

## 2023-01-12 MED ORDER — LISINOPRIL 2.5 MG PO TABS
2.5000 mg | ORAL_TABLET | Freq: Every day | ORAL | 0 refills | Status: DC
Start: 2023-01-12 — End: 2023-04-08

## 2023-01-21 ENCOUNTER — Telehealth: Payer: Self-pay

## 2023-01-21 ENCOUNTER — Other Ambulatory Visit: Payer: Self-pay

## 2023-01-21 DIAGNOSIS — Z1211 Encounter for screening for malignant neoplasm of colon: Secondary | ICD-10-CM

## 2023-01-21 MED ORDER — NA SULFATE-K SULFATE-MG SULF 17.5-3.13-1.6 GM/177ML PO SOLN
1.0000 | Freq: Once | ORAL | 0 refills | Status: AC
Start: 2023-01-21 — End: 2023-01-21

## 2023-01-21 NOTE — Telephone Encounter (Addendum)
Patient called to schedule colonoscopy. Call Patient at [phone] this the best number to contact the patient.

## 2023-01-21 NOTE — Telephone Encounter (Signed)
Gastroenterology Pre-Procedure Review  Request Date: 02/26/23 Requesting Physician: Dr. Tobi Bastos  PATIENT REVIEW QUESTIONS: The patient responded to the following health history questions as indicated:    1. Are you having any GI issues? no 2. Do you have a personal history of Polyps? no 3. Do you have a family history of Colon Cancer or Polyps? yes (mother polyps) 4. Diabetes Mellitus?  Yes patient takes [place] Pepper has been advised to stop 3 days prior to colonoscopy.  Also takes Ozempic has been advised to stop 7 days prior to colonoscopy 5. Joint replacements in the past 12 months?no 6. Major health problems in the past 3 months?no 7. Any artificial heart valves, MVP, or defibrillator?no    MEDICATIONS & ALLERGIES:    Patient reports the following regarding taking any anticoagulation/antiplatelet therapy:   Plavix, Coumadin, Eliquis, Xarelto, Lovenox, Pradaxa, Brilinta, or Effient? no Aspirin? no  Patient confirms/reports the following medications:  Current Outpatient Medications  Medication Sig Dispense Refill   amphetamine-dextroamphetamine (ADDERALL) 20 MG tablet Take 20 mg by mouth daily.     atorvastatin (LIPITOR) 20 MG tablet TAKE 1 TABLET(20 MG) BY MOUTH AT BEDTIME 90 tablet 0   baclofen (LIORESAL) 10 MG tablet Take 1 tablet by mouth 3 (three) times daily as needed.     cetirizine (ZYRTEC) 10 MG tablet Take 10 mg by mouth daily as needed for allergies.     Cholecalciferol (D 1000) 1000 units capsule Take by mouth.      Continuous Blood Gluc Sensor (DEXCOM G7 SENSOR) MISC Use as directed. Apply 1 sensor every 10 days. 3 each 12   Cyanocobalamin (VITAMIN B-12) 5000 MCG SUBL Place under the tongue.     dalfampridine 10 MG TB12      desonide (DESOWEN) 0.05 % cream Apply topically daily as needed. 30 g 2   empagliflozin (JARDIANCE) 25 MG TABS tablet TAKE 1 TABLET(25 MG) BY MOUTH DAILY BEFORE BREAKFAST 90 tablet 1   gabapentin (NEURONTIN) 300 MG capsule Take 300 mg by mouth 3 (three)  times daily.     glucose blood (ONETOUCH ULTRA) test strip USE TO CHECK BLOOD SUGAR THREE TIMES DAILY AS DIRECTED 300 strip 3   ibuprofen (ADVIL,MOTRIN) 800 MG tablet Take 800 mg by mouth every 6 (six) hours as needed.     Insulin Pen Needle (B-D UF III MINI PEN NEEDLES) 31G X 5 MM MISC Use to inject insulin.  DX: E11.9 100 each 3   ipratropium (ATROVENT) 0.06 % nasal spray USE 2 SPRAYS IN EACH NOSTRIL FOUR TIMES DAILY FOR UP TO 5 TO 7 DAYS THEN STOP 15 mL 0   ketoconazole (NIZORAL) 2 % shampoo Apply 1 application topically daily as needed. 120 mL 2   Lancets (ONETOUCH ULTRASOFT) lancets Check blood sugar three times daily. 100 each 12   lisinopril (ZESTRIL) 2.5 MG tablet Take 1 tablet (2.5 mg total) by mouth daily. 90 tablet 0   Multiple Vitamins-Minerals (PRESERVISION AREDS PO) Take by mouth.     Ocrelizumab (OCREVUS IV) Inject into the vein.     OZEMPIC, 2 MG/DOSE, 8 MG/3ML SOPN INJECT 2MG  UNDER THE SKIN ONCE A WEEK. 9 mL 1   tiZANidine (ZANAFLEX) 4 MG tablet TAKE 1/2 TO 1 TABLET(2 TO 4 MG) BY MOUTH EVERY 8 HOURS AS NEEDED FOR MUSCLE SPASMS 60 tablet 3   traMADol (ULTRAM) 50 MG tablet Take 50 mg by mouth daily as needed.     TRESIBA FLEXTOUCH 100 UNIT/ML FlexTouch Pen ADMINISTER 20 UNITS UNDER THE SKIN DAILY (  Patient taking differently: 16 Units daily.) 15 mL 2   No current facility-administered medications for this visit.    Patient confirms/reports the following allergies:  No Known Allergies  No orders of the defined types were placed in this encounter.   AUTHORIZATION INFORMATION Primary Insurance: 1D#: Group #:  Secondary Insurance: 1D#: Group #:  SCHEDULE INFORMATION: Date:  Time: Location:

## 2023-01-26 ENCOUNTER — Ambulatory Visit: Payer: BC Managed Care – PPO | Admitting: Pharmacist

## 2023-01-26 DIAGNOSIS — Z794 Long term (current) use of insulin: Secondary | ICD-10-CM

## 2023-01-26 NOTE — Progress Notes (Signed)
01/26/2023 Name: Miguel [name] MRN: [id number] DOB: 23-May-1970  Chief Complaint  Patient presents with   Medication Management    Miguel [name] is a 52 y.o. year old male who presented for a telephone visit.   They were referred to the pharmacist by their PCP for assistance in managing diabetes and medication access.    Subjective:  Care Team: Primary Care Provider: Smitty Cords, DO ; Next Scheduled Visit: 04/14/2023 Pain Management: Dr. Pecolia [facility]; Next Scheduled Visit: 02/09/2023 Neurology: [facility] Urology: Vanna Scotland, MD [facility]  Medication Access/Adherence  Current Pharmacy:  [id number] - [place], [place] - [address] AT [facility] OF [address] [facility] [address] [place] [address] Phone: [phone] Fax: [phone]  [facility] - [place], [place] - [address] [place] [address] Phone: [phone] Fax: [phone]   Patient reports affordability concerns with their medications: No  Patient reports access/transportation concerns to their pharmacy: No  Patient reports adherence concerns with their medications:  No   Note patient scheduled for colonoscopy on 02/26/2023 - Confirms has instructions for his medications prior to this procedure   Type 2 Diabetes:   Current medications:  Ozempic 2 mg weekly on Sundays  Tresiba FlexTouch 16 units daily in evening (reports self-increased to 18 units daily during steroid course, but now back to previous dose) Jardiance 25 mg daily with breakfast  Medications tried in the past: metformin (IR GI intolerance/felt ER at tolerated dose was ineffective)   Denies missed doses   Note blood sugar control impacted by receiving steroid infusions and injections: Receives steroid injections ~every 3 months for pain management and steroid infusions every 6 months with multiple sclerosis  treatment (Ocrelizumab infusion)   Patient uses Dexcom G7 Continuous Glucose Monitoring (CGM), but reports recently had a gap in sensor use as was traveling and had to wait until returned to town to replace sensor  Date of Download: 01/26/23 % Time CGM is active: 66.9% Average Glucose: 192 mg/dL Glucose Management Indicator: 7.9%  Glucose Variability: 22.6% (goal <36%) Time in Goal:  - Time in range 70-180: 45% - Time above range: 55% - Time below range: 0% Observed patterns:  - For this 14-day period overall blood sugar readings elevated through out the day in 1st week of readings.  - Gap in data (no readings available) from 01/21/2023 in ~8 am to 01/25/2023 ~6 pm   Attributes recent elevated blood sugar readings to recent prednisone taper related to hip pain that he completed last week and stress from work/a family member's health   Uses CGM as tool to reinforce/aid with dietary choices and improve blood sugar control   Current physical activity: Reports currently working with PT related to hernia x 2 days/week. Walking throughout the day at work/at home walking ~20 minutes x 3-4 days/week   Reports continues to work on weight loss, current home weight ~216 lbs   Statin therapy: atorvastatin 20 mg daily   Patient denies hypoglycemic s/sx including dizziness, shakiness, sweating.  Confirms carries glucose tablets in case of symptoms of hypoglycemia     Objective:  Lab Results  Component Value Date   HGBA1C 6.8 (A) 09/30/2022    Lab Results  Component Value Date   CREATININE 0.76 03/24/2022   BUN 11 03/24/2022   NA 139 03/24/2022   K 4.3 03/24/2022   CL 103 03/24/2022  CO2 26 03/24/2022    Lab Results  Component Value Date   CHOL 138 03/24/2022   HDL 32 (L) 03/24/2022   LDLCALC 77 03/24/2022   TRIG 192 (H) 03/24/2022   CHOLHDL 4.3 03/24/2022   BP Readings from Last 3 Encounters:  09/30/22 112/68  07/02/22 112/70  07/01/22 114/80   Pulse Readings from Last 3  Encounters:  09/30/22 85  07/02/22 80  07/01/22 89     Medications Reviewed Today     Reviewed by Manuela Neptune, RPH-CPP (Pharmacist) on 01/26/23 at 0950  Med List Status: <None>   Medication Order Taking? Sig Documenting Provider Last Dose Status Informant  amphetamine-dextroamphetamine (ADDERALL) 20 MG tablet [id number]  Take 20 mg by mouth daily. [provider]  Active   atorvastatin (LIPITOR) 20 MG tablet [id number]  TAKE 1 TABLET(20 MG) BY MOUTH AT BEDTIME Karamalegos, [name], DO  Active   baclofen (LIORESAL) 10 MG tablet [id number]  Take 1 tablet by mouth 3 (three) times daily as needed.  Patient not taking: Reported on 01/21/2023   [provider]  Active   cetirizine (ZYRTEC) 10 MG tablet [id number]  Take 10 mg by mouth daily as needed for allergies.  Patient not taking: Reported on 01/21/2023   [provider]  Active   Cholecalciferol (D 1000) 1000 units capsule [id number]  Take by mouth.  [provider]  Active            Med Note Francesco Runner, JAMIE A   Thu May 10, 2015  9:44 AM) Received from: [facility]  Continuous Blood Gluc Sensor (DEXCOM G7 SENSOR) [place] [id number]  Use as directed. Apply 1 sensor every 10 days. Smitty Cords, DO  Active   Cyanocobalamin (VITAMIN B-12) 5000 MCG SUBL [id number]  Place under the tongue. [provider]  Active            Med Note Francesco Runner, JAMIE A   Thu May 10, 2015  9:44 AM) Received from: [facility]  dalfampridine 10 [place] PI95 [id number]   [provider]  Active   desonide (DESOWEN) 0.05 % cream [id number]  Apply topically daily as needed. Smitty Cords, DO  Active   empagliflozin (JARDIANCE) 25 MG TABS tablet [id number] Yes TAKE 1 TABLET(25 MG) BY MOUTH DAILY BEFORE BREAKFAST Karamalegos, [name], DO Taking Active   gabapentin (NEURONTIN) 300 MG capsule [id number]  Take 300 mg by mouth 3 (three) times daily. [provider]  Active            Med  Note Francesco Runner, JAMIE A   Thu May 10, 2015  9:44 AM) Received from: [facility]  glucose blood [facility] ULTRA) test strip [id number]  USE TO CHECK BLOOD SUGAR THREE TIMES DAILY AS DIRECTED Smitty Cords, DO  Active   ibuprofen (ADVIL,MOTRIN) 800 MG tablet [id number]  Take 800 mg by mouth every 6 (six) hours as needed.  Patient not taking: Reported on 01/21/2023   [provider]  Active            Med Note Francesco Runner, JAMIE A   Thu May 10, 2015  9:44 AM) Received from: [facility]  Insulin Pen Needle (B-D UF III MINI PEN NEEDLES) 31G X 5 MM MISC [id number]  Use to inject insulin.  DX: E11.9  Patient not taking: Reported on 01/21/2023   Smitty Cords, DO  Active   ipratropium (ATROVENT) 0.06 % nasal spray [id number]  USE 2 SPRAYS IN  EACH NOSTRIL FOUR TIMES DAILY FOR UP TO 5 TO 7 DAYS THEN STOP  Patient not taking: Reported on 01/21/2023   Smitty Cords, DO  Active   ketoconazole (NIZORAL) 2 % shampoo [id number]  Apply 1 application topically daily as needed. Smitty Cords, DO  Active   Lancets [facility] ULTRASOFT) lancets [id number]  Check blood sugar three times daily. Karamalegos, [name], DO  Active   lisinopril (ZESTRIL) 2.5 MG tablet [id number]  Take 1 tablet (2.5 mg total) by mouth daily. Smitty Cords, DO  Active   Multiple Vitamins-Minerals (PRESERVISION AREDS PO) [id number]  Take by mouth. [provider]  Active   Ocrelizumab (OCREVUS IV) [id number]  Inject into the vein. [provider]  Active   OZEMPIC, 2 MG/DOSE, 8 MG/3ML SOPN [id number] Yes INJECT 2MG  UNDER THE SKIN ONCE A WEEK. Smitty Cords, DO Taking Active   tiZANidine (ZANAFLEX) 4 MG tablet [id number]  TAKE 1/2 TO 1 TABLET(2 TO 4 MG) BY MOUTH EVERY 8 HOURS AS NEEDED FOR MUSCLE SPASMS Karamalegos, [name], DO  Active   traMADol (ULTRAM) 50 MG tablet [id number]  Take 50 mg by mouth daily as needed. [provider]  Active   TRESIBA  FLEXTOUCH 100 UNIT/ML FlexTouch Pen [id number] Yes ADMINISTER 20 UNITS UNDER THE SKIN DAILY  Patient taking differently: 16 Units daily.   Smitty Cords, DO Taking Active               Assessment/Plan:   Diabetes: - Note from review of CGM data, blood sugar readings elevated last week when patient on prednisone course, but improved this week off of steroids - Reviewed long term cardiovascular and renal outcomes of uncontrolled blood sugar - Reviewed dietary modifications including having regular well-balanced meals throughout the day and controlling carbohydrate portion sizes - Recommend to continue to use CGM as tool to reinforce/aid with dietary choices and improve blood sugar control Patient to continue to use glucometer to complete fingerstick reading when needed for symptoms/unsure of CGM data or if instructed by CGM device - Have reviewed symptoms of/how to manage low blood sugar. Encourage patient to continue to carry glucose tablets     Follow Up Plan: Clinic Pharmacist will follow up with patient by telephone on 05/22/2023 at 9:15 AM    Estelle Grumbles, PharmD, P[place]y [place], CPP Clinical Pharmacist [facility] [phone]

## 2023-01-26 NOTE — Patient Instructions (Signed)
Goals Addressed             This Visit's Progress    Pharmacy Goals       Our goal A1c is less than 7%. This corresponds with fasting sugars less than 130 and 2 hour after meal sugars less than 180. Please check your blood sugar and keep a log of the results  Our goal bad cholesterol, or LDL, is less than 70 . This is why it is important to continue taking your atorvastatin.  Feel free to call me with any questions or concerns. I look forward to our next call!  Elisabeth Delles, PharmD, BCACP, CPP Clinical Pharmacist [facility] [phone]        

## 2023-01-29 ENCOUNTER — Other Ambulatory Visit: Payer: Self-pay | Admitting: Family Medicine

## 2023-01-29 DIAGNOSIS — E1169 Type 2 diabetes mellitus with other specified complication: Secondary | ICD-10-CM

## 2023-01-29 NOTE — Telephone Encounter (Signed)
Requested medication (s) are due for refill today:   Yes  Requested medication (s) are on the active medication list:   Yes  Future visit scheduled:   Yes 11/26   Last ordered: 12/10/2021 15 ml, 2 refills  Returned because pt is taking a different dose than what is prescribed.      Requested Prescriptions  Pending Prescriptions Disp Refills   insulin degludec (TRESIBA FLEXTOUCH) 100 UNIT/ML FlexTouch Pen 15 mL 2     Endocrinology:  Diabetes - Insulins Passed - 01/29/2023 10:06 AM      Passed - HBA1C is between 0 and 7.9 and within 180 days    Hemoglobin A1C  Date Value Ref Range Status  09/30/2022 6.8 (A) 4.0 - 5.6 % Final   Hgb A1c MFr Bld  Date Value Ref Range Status  03/24/2022 7.5 (H) <5.7 % of total Hgb Final    Comment:    For someone without known diabetes, a hemoglobin A1c value of 6.5% or greater indicates that they may have  diabetes and this should be confirmed with a follow-up  test. . For someone with known diabetes, a value <7% indicates  that their diabetes is well controlled and a value  greater than or equal to 7% indicates suboptimal  control. A1c targets should be individualized based on  duration of diabetes, age, comorbid conditions, and  other considerations. . Currently, no consensus exists regarding use of hemoglobin A1c for diagnosis of diabetes for children. [name] - Valid encounter within last 6 months    Recent Outpatient Visits           3 days ago Controlled type 2 diabetes mellitus with complication, with long-term current use of insulin [facility])   [place] [facility] Delles, [name], RPH-CPP   2 months ago Hyperlipidemia associated with type 2 diabetes mellitus [facility])   [place] [facility] Delles, [name], RPH-CPP   4 months ago Type 2 diabetes mellitus with other specified complication, with long-term current use of insulin [facility])   [place] [facility]  [place], [name], DO   5 months ago Type 2 diabetes mellitus with other specified complication, with long-term current use of insulin [facility])   [place] [facility] Delles, [name], RPH-CPP   5 months ago Type 2 diabetes mellitus with other specified complication, with long-term current use of insulin [facility])   [place] [facility] Delles, [name], RPH-CPP       Future Appointments             In 2 months [name], [name], DO [place] [facility], [facility]

## 2023-01-29 NOTE — Telephone Encounter (Signed)
Medication Refill - Medication: TRESIBA FLEXTOUCH 100 UNIT/ML FlexTouch Pen  Has the patient contacted their pharmacy? Yes.   Pt has 0 refills and is needing a new prescription sent to the pharmacy.    Preferred Pharmacy (with phone number or street name): [id number] - [place],  - [address] [facility] [address] [facility]  Phone: [phone] Fax: [phone]  Has the patient been seen for an appointment in the last year OR does the patient have an upcoming appointment? Yes.    Agent: Please be advised that RX refills may take up to 3 business days. We ask that you follow-up with your pharmacy.

## 2023-01-30 MED ORDER — TRESIBA FLEXTOUCH 100 UNIT/ML ~~LOC~~ SOPN: 16.0000 [IU] | PEN_INJECTOR | Freq: Every day | SUBCUTANEOUS | 0 refills | Status: DC

## 2023-02-26 ENCOUNTER — Ambulatory Visit
Admission: RE | Admit: 2023-02-26 | Discharge: 2023-02-26 | Disposition: A | Discharge status: Home / Self Care | Payer: BC Managed Care – PPO | Source: Ambulatory Visit | Attending: Gastroenterology | Admitting: Gastroenterology

## 2023-02-26 ENCOUNTER — Ambulatory Visit: Payer: BC Managed Care – PPO | Admitting: Certified Registered"

## 2023-02-26 ENCOUNTER — Encounter
Admission: RE | Disposition: A | Discharge status: Home / Self Care | Payer: Self-pay | Source: Ambulatory Visit | Attending: Gastroenterology

## 2023-02-26 DIAGNOSIS — K635 Polyp of colon: Secondary | ICD-10-CM | POA: Diagnosis not present

## 2023-02-26 DIAGNOSIS — Z1211 Encounter for screening for malignant neoplasm of colon: Secondary | ICD-10-CM

## 2023-02-26 DIAGNOSIS — D122 Benign neoplasm of ascending colon: Secondary | ICD-10-CM | POA: Insufficient documentation

## 2023-02-26 DIAGNOSIS — D126 Benign neoplasm of colon, unspecified: Secondary | ICD-10-CM

## 2023-02-26 HISTORY — PX: POLYPECTOMY: SHX5525

## 2023-02-26 HISTORY — PX: COLONOSCOPY WITH PROPOFOL: SHX5780

## 2023-02-26 LAB — GLUCOSE, CAPILLARY: Glucose-Capillary: 135 mg/dL — ABNORMAL HIGH (ref 70–99)

## 2023-02-26 SURGERY — COLONOSCOPY WITH PROPOFOL
Anesthesia: General

## 2023-02-26 MED ORDER — PROPOFOL 10 MG/ML IV BOLUS
INTRAVENOUS | Status: DC | PRN
  Administered 2023-02-26: 70 mg via INTRAVENOUS

## 2023-02-26 MED ORDER — MIDAZOLAM HCL 2 MG/2ML IJ SOLN
INTRAMUSCULAR | Status: AC
  Filled 2023-02-26: qty 2

## 2023-02-26 MED ORDER — LIDOCAINE HCL (CARDIAC) PF 100 MG/5ML IV SOSY
PREFILLED_SYRINGE | INTRAVENOUS | Status: DC | PRN
  Administered 2023-02-26: 100 mg via INTRAVENOUS

## 2023-02-26 MED ORDER — PROPOFOL 500 MG/50ML IV EMUL
INTRAVENOUS | Status: DC | PRN
  Administered 2023-02-26: 165 ug/kg/min via INTRAVENOUS

## 2023-02-26 MED ORDER — SODIUM CHLORIDE 0.9 % IV SOLN: INTRAVENOUS | Status: DC

## 2023-02-26 MED ORDER — MIDAZOLAM HCL 2 MG/2ML IJ SOLN
INTRAMUSCULAR | Status: DC | PRN
  Administered 2023-02-26: 2 mg via INTRAVENOUS

## 2023-02-26 NOTE — Anesthesia Procedure Notes (Signed)
Procedure Name: General with mask airway Date/Time: 02/26/2023 9:40 AM  Performed by: Mohammed Kindle, CRNAPre-anesthesia Checklist: Patient identified, Emergency Drugs available, Suction available and Patient being monitored Oxygen Delivery Method: Simple face mask Induction Type: IV induction Placement Confirmation: positive ETCO2, CO2 detector and breath sounds checked- equal and bilateral Dental Injury: Teeth and Oropharynx as per pre-operative assessment

## 2023-02-26 NOTE — Op Note (Signed)
[facility] Patient Name: Miguel [name] Procedure Date: 02/26/2023 9:27 AM MRN: [id number] Account #: [id number] Date of Birth: [birth year] Admit Type: Outpatient Age: 52 Room: [facility] ENDO ROOM 2 Gender: Male Note Status: Finalized Instrument Name: Prentice Docker [id number] Procedure:             Colonoscopy Indications:           Screening for colorectal malignant neoplasm Providers:             [name] MD, MD Referring MD:          Smitty Cords (Referring MD) Medicines:             Monitored Anesthesia Care Complications:         No immediate complications. Procedure:             Pre-Anesthesia Assessment:                        - Prior to the procedure, a History and Physical was                         performed, and patient medications, allergies and                         sensitivities were reviewed. The patient's tolerance                         of previous anesthesia was reviewed.                        - The risks and benefits of the procedure and the                         sedation options and risks were discussed with the                         patient. All questions were answered and informed                         consent was obtained.                        - ASA Grade Assessment: II - A patient with mild                         systemic disease.                        After obtaining informed consent, the colonoscope was                         passed under direct vision. Throughout the procedure,                         the patient's blood pressure, pulse, and oxygen                         saturations were monitored continuously. The                         Colonoscope was introduced  through the anus and                         advanced to the the cecum, identified by the                         appendiceal orifice. The colonoscopy was performed                         without difficulty. The patient tolerated the                          procedure well. The quality of the bowel preparation                         was good. The ileocecal valve, appendiceal orifice,                         and rectum were photographed. Findings:      The perianal and digital rectal examinations were normal.      A 4 mm polyp was found in the ascending colon. The polyp was sessile.       The polyp was removed with a jumbo cold forceps. Resection and retrieval       were complete.      The exam was otherwise without abnormality on direct and retroflexion       views. Impression:            - One 4 mm polyp in the ascending colon, removed with                         a jumbo cold forceps. Resected and retrieved.                        - The examination was otherwise normal on direct and                         retroflexion views. Recommendation:        - Discharge patient to home (with escort).                        - Resume previous diet.                        - Continue present medications.                        - Await pathology results.                        - Repeat colonoscopy for surveillance based on                         pathology results. Procedure Code(s):     --- Professional ---                        [phone], Colonoscopy, flexible; with biopsy, single or                         multiple Diagnosis Code(s):     ---  Professional ---                        Z12.11, Encounter for screening for malignant neoplasm                         of colon                        D12.2, Benign neoplasm of ascending colon CPT copyright 2022 [employer]. All rights reserved. The codes documented in this report are preliminary and upon coder review may  be revised to meet current compliance requirements. [name], MD [name] MD, MD 02/26/2023 9:43:25 AM This report has been signed electronically. Number of Addenda: 0 Note Initiated On: 02/26/2023 9:27 AM Scope Withdrawal Time: 0 hours 9 minutes  12 seconds  Total Procedure Duration: 0 hours 10 minutes 40 seconds  Estimated Blood Loss:  Estimated blood loss: none.      [facility]

## 2023-02-26 NOTE — Transfer of Care (Signed)
Immediate Anesthesia Transfer of Care Note  Patient: Miguel [name]  Procedure(s) Performed: COLONOSCOPY WITH PROPOFOL POLYPECTOMY  Patient Location: Endoscopy Unit  Anesthesia Type:General  Level of Consciousness: awake, drowsy, and patient cooperative  Airway & Oxygen Therapy: Patient Spontanous Breathing and Patient connected to face mask oxygen  Post-op Assessment: Report given to RN and Post -op Vital signs reviewed and stable  Post vital signs: Reviewed and stable  Last Vitals:  Vitals Value Taken Time  BP 130/106 02/26/23 0946  Temp 35.8 C 02/26/23 0946  Pulse 92 02/26/23 0950  Resp 14 02/26/23 0950  SpO2 98 % 02/26/23 0950  Vitals shown include unfiled device data.  Last Pain:  Vitals:   02/26/23 0946  TempSrc: Temporal  PainSc: Asleep         Complications: No notable events documented.

## 2023-02-26 NOTE — Anesthesia Postprocedure Evaluation (Signed)
Anesthesia Post Note  Patient: Miguel [name]  Procedure(s) Performed: COLONOSCOPY WITH PROPOFOL POLYPECTOMY  Patient location during evaluation: PACU Anesthesia Type: General Level of consciousness: awake and alert Pain management: pain level controlled Vital Signs Assessment: post-procedure vital signs reviewed and stable Respiratory status: spontaneous breathing, nonlabored ventilation, respiratory function stable and patient connected to nasal cannula oxygen Cardiovascular status: blood pressure returned to baseline and stable Postop Assessment: no apparent nausea or vomiting Anesthetic complications: no   No notable events documented.   Last Vitals:  Vitals:   02/26/23 0956 02/26/23 1006  BP: (!) 138/90 128/88  Pulse: 87 71  Resp: 17 16  Temp:    SpO2: 99% 100%    Last Pain:  Vitals:   02/26/23 1006  TempSrc:   PainSc: 0-No pain                 Yevette Edwards

## 2023-02-26 NOTE — H&P (Signed)
Miguel Mood, MD [address], [place], [place], [address] [address], [place], [place], [address] Phone: [phone]  Fax: [phone]  Primary Care Physician:  Smitty Cords, DO   Pre-Procedure History & Physical: HPI:  Miguel [name] is a 52 y.o. male is here for an colonoscopy.   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   Renal colic 05/09/2013   Shoulder pain 07/23/2015   Sleep apnea     Past Surgical History:  Procedure Laterality Date   HERNIA REPAIR     URETEROSCOPY WITH HOLMIUM LASER LITHOTRIPSY     x 2    Prior to Admission medications   Medication Sig Start Date End Date Taking? Authorizing Provider  amphetamine-dextroamphetamine (ADDERALL) 20 MG tablet Take 20 mg by mouth daily.    [provider]  atorvastatin (LIPITOR) 20 MG tablet TAKE 1 TABLET(20 MG) BY MOUTH AT BEDTIME 01/07/23   Karamalegos, Alexander J, DO  baclofen (LIORESAL) 10 MG tablet Take 1 tablet by mouth 3 (three) times daily as needed. Patient not taking: Reported on 01/21/2023 05/07/22   [provider]  cetirizine (ZYRTEC) 10 MG tablet Take 10 mg by mouth daily as needed for allergies. Patient not taking: Reported on 01/21/2023    [provider]  Cholecalciferol (D 1000) 1000 units capsule Take by mouth.     [provider]  Continuous Blood Gluc Sensor (DEXCOM G7 SENSOR) MISC Use as directed. Apply 1 sensor every 10 days. 08/04/22   Karamalegos, [name], DO  Cyanocobalamin (VITAMIN B-12) 5000 MCG SUBL Place under the tongue.    [provider]  dalfampridine 10 MG TB12  05/16/22   [provider]  desonide (DESOWEN) 0.05 % cream Apply topically daily as needed. 02/19/22   Karamalegos, [name], DO  empagliflozin (JARDIANCE) 25 MG TABS tablet TAKE 1 TABLET(25 MG) BY MOUTH DAILY BEFORE BREAKFAST 11/28/22   Karamalegos, [name], DO  gabapentin (NEURONTIN) 300 MG capsule Take 300 mg by mouth 3 (three) times daily. 03/08/12   [provider]  glucose blood (ONETOUCH ULTRA) test strip USE TO CHECK BLOOD SUGAR THREE TIMES DAILY AS DIRECTED 05/02/22   [name], [name], DO  ibuprofen (ADVIL,MOTRIN) 800 MG tablet Take 800 mg by mouth every 6 (six) hours as needed. Patient not taking: Reported on 01/21/2023 12/12/14   [provider]  insulin degludec (TRESIBA FLEXTOUCH) 100 UNIT/ML FlexTouch Pen Inject 16 Units into the skin daily. 01/30/23   [name], NP  Insulin Pen Needle (B-D UF III MINI PEN NEEDLES) 31G X 5 MM MISC Use to inject insulin.  DX: E11.9 Patient not taking: Reported on 01/21/2023 01/05/23   Smitty Cords, DO  ipratropium (ATROVENT) 0.06 % nasal spray USE 2 SPRAYS IN EACH NOSTRIL FOUR TIMES DAILY FOR UP TO 5  TO 7 DAYS THEN STOP Patient not taking: Reported on 01/21/2023 06/25/21   Smitty Cords, DO  ketoconazole (NIZORAL) 2 % shampoo Apply 1 application topically daily as needed. 09/20/19   Karamalegos, [name], DO  Lancets [facility] ULTRASOFT) lancets Check blood sugar three times daily. 11/12/17   Karamalegos, [name], DO  lisinopril (ZESTRIL) 2.5 MG tablet Take 1 tablet (2.5 mg total) by mouth daily. 01/12/23   Smitty Cords, DO  Multiple Vitamins-Minerals (PRESERVISION AREDS PO) Take by mouth.    [provider]  Ocrelizumab (OCREVUS IV) Inject into the vein.    [provider]  OZEMPIC, 2 MG/DOSE, 8 MG/3ML SOPN INJECT 2MG  UNDER THE SKIN ONCE A WEEK. 10/09/22   Karamalegos, [name], DO  tiZANidine (ZANAFLEX) 4 MG tablet TAKE 1/2 TO 1 TABLET(2 TO 4 MG) BY MOUTH EVERY 8 HOURS AS NEEDED FOR MUSCLE SPASMS 09/30/22   Karamalegos, [name], DO  traMADol (ULTRAM) 50 MG tablet Take 50 mg by mouth daily as needed.    [provider]    Allergies as of 01/22/2023   (No Known Allergies)    Family History  Problem Relation Age of Onset   Colon cancer Neg Hx    Prostate cancer Neg Hx    Bladder Cancer Neg Hx    Kidney cancer Neg Hx     Social History   Socioeconomic History   Marital status: Married    Spouse name: Not on file   Number of children: Not on file   Years of education: Not on file   Highest education level: Not on file  Occupational History   Not on file  Tobacco Use   Smoking status: Never   Smokeless tobacco: Never  Substance and Sexual Activity   Alcohol use: No   Drug use: No   Sexual activity: Not on file  Other Topics Concern   Not on file  Social History Narrative   Not on file   Social Determinants of Health   Financial Resource Strain: Not on file  Food Insecurity: Not on file  Transportation Needs: Not on file  Physical Activity: Not on file  Stress: Not on file  Social Connections: Not on file  Intimate Partner Violence: Not on file    Review of Systems: See HPI, otherwise negative ROS  Physical Exam: Ht 5\' 10"  (1.778 m)   BMI 32.86 kg/m  General:   Alert,  pleasant and cooperative in NAD Head:  Normocephalic and atraumatic. Neck:  Supple; no masses or thyromegaly. Lungs:  Clear throughout to auscultation, normal respiratory effort.    Heart:  +S1, +S2, Regular rate and rhythm, No edema. Abdomen:  Soft, nontender and nondistended. Normal bowel sounds, without guarding, and without rebound.   Neurologic:  Alert  and  oriented x4;  grossly normal neurologically.  Impression/Plan: Miguel [name] is here for an colonoscopy to be performed for Screening colonoscopy average risk   Risks, benefits, limitations, and alternatives regarding  colonoscopy have been reviewed with the patient.  Questions have been answered.  All parties agreeable.   Miguel Mood, MD  02/26/2023, 8:37 AM

## 2023-02-26 NOTE — Anesthesia Preprocedure Evaluation (Signed)
Anesthesia Evaluation  Patient identified by MRN, date of birth, ID band Patient awake    Reviewed: Allergy & Precautions, H&P , NPO status , Patient's Chart, lab work & pertinent test results, reviewed documented beta blocker date and time   Airway Mallampati: II   Neck ROM: full    Dental  (+) Poor Dentition   Pulmonary sleep apnea    Pulmonary exam normal        Cardiovascular Exercise Tolerance: Good negative cardio ROS Normal cardiovascular exam Rhythm:regular Rate:Normal     Neuro/Psych negative neurological ROS  negative psych ROS   GI/Hepatic negative GI ROS, Neg liver ROS,,,  Endo/Other  negative endocrine ROSdiabetes    Renal/GU Renal disease  negative genitourinary   Musculoskeletal   Abdominal   Peds  Hematology negative hematology ROS (+)   Anesthesia Other Findings Past Medical History: No date: Allergy 05/09/2013: Calculus of kidney 02/28/2015: Chronic prostatitis 11/09/2012: Exomphalos 12/09/2019: Foreign body in right foot 07/04/2014: Groin pain 08/27/2016: Gross hematuria 10/26/2015: Hip pain 01/14/2016: Hyperlipidemia associated with type 2 diabetes mellitus  (HCC) 07/04/2014: Incomplete bladder emptying 05/05/2016: Inguinal neuralgia 11/09/2012: Multiple sclerosis (HCC) 10/17/2016: Myofascial pain     Comment:  Overview:  Right Flank, secondary to rehab from               hernia/inguinal neuropathy 12/01/2012: OSA on CPAP     Comment:  Overview:  Dx 2009.  Borderline but uses CPAP 02/28/2015: Peripheral neuropathic pain 05/09/2013: Renal colic 07/23/2015: Shoulder pain No date: Sleep apnea Past Surgical History: No date: HERNIA REPAIR No date: URETEROSCOPY WITH HOLMIUM LASER LITHOTRIPSY     Comment:  x 2 BMI    Body Mass Index: 32.86 kg/m     Reproductive/Obstetrics negative OB ROS                             Anesthesia Physical Anesthesia Plan  ASA:  3  Anesthesia Plan: General   Post-op Pain Management:    Induction:   PONV Risk Score and Plan:   Airway Management Planned:   Additional Equipment:   Intra-op Plan:   Post-operative Plan:   Informed Consent: I have reviewed the patients History and Physical, chart, labs and discussed the procedure including the risks, benefits and alternatives for the proposed anesthesia with the patient or authorized representative who has indicated his/her understanding and acceptance.     Dental Advisory Given  Plan Discussed with: CRNA  Anesthesia Plan Comments:        Anesthesia Quick Evaluation

## 2023-02-27 ENCOUNTER — Encounter: Payer: Self-pay | Admitting: Gastroenterology

## 2023-02-27 LAB — SURGICAL PATHOLOGY

## 2023-03-01 ENCOUNTER — Encounter: Payer: Self-pay | Admitting: Gastroenterology

## 2023-03-31 ENCOUNTER — Other Ambulatory Visit: Payer: BC Managed Care – PPO

## 2023-04-08 ENCOUNTER — Ambulatory Visit (INDEPENDENT_AMBULATORY_CARE_PROVIDER_SITE_OTHER): Payer: BC Managed Care – PPO | Admitting: Family Medicine

## 2023-04-08 ENCOUNTER — Encounter: Payer: Self-pay | Admitting: Family Medicine

## 2023-04-08 ENCOUNTER — Other Ambulatory Visit: Payer: BC Managed Care – PPO

## 2023-04-08 VITALS — BP 100/60 | Ht 70.0 in | Wt 215.0 lb

## 2023-04-08 DIAGNOSIS — Z0000 Encounter for general adult medical examination without abnormal findings: Secondary | ICD-10-CM | POA: Diagnosis not present

## 2023-04-08 DIAGNOSIS — E1169 Type 2 diabetes mellitus with other specified complication: Secondary | ICD-10-CM | POA: Diagnosis not present

## 2023-04-08 DIAGNOSIS — E118 Type 2 diabetes mellitus with unspecified complications: Secondary | ICD-10-CM

## 2023-04-08 DIAGNOSIS — G4733 Obstructive sleep apnea (adult) (pediatric): Secondary | ICD-10-CM | POA: Diagnosis not present

## 2023-04-08 DIAGNOSIS — Z794 Long term (current) use of insulin: Secondary | ICD-10-CM

## 2023-04-08 DIAGNOSIS — M62830 Muscle spasm of back: Secondary | ICD-10-CM

## 2023-04-08 DIAGNOSIS — G35 Multiple sclerosis: Secondary | ICD-10-CM

## 2023-04-08 DIAGNOSIS — E785 Hyperlipidemia, unspecified: Secondary | ICD-10-CM

## 2023-04-08 DIAGNOSIS — Z23 Encounter for immunization: Secondary | ICD-10-CM

## 2023-04-08 DIAGNOSIS — Z125 Encounter for screening for malignant neoplasm of prostate: Secondary | ICD-10-CM

## 2023-04-08 DIAGNOSIS — B360 Pityriasis versicolor: Secondary | ICD-10-CM

## 2023-04-08 DIAGNOSIS — M47814 Spondylosis without myelopathy or radiculopathy, thoracic region: Secondary | ICD-10-CM

## 2023-04-08 MED ORDER — EMPAGLIFLOZIN 25 MG PO TABS
25.0000 mg | ORAL_TABLET | Freq: Every day | ORAL | 3 refills | Status: DC
Start: 2023-04-08 — End: 2024-03-29

## 2023-04-08 MED ORDER — OZEMPIC (2 MG/DOSE) 8 MG/3ML ~~LOC~~ SOPN: 2.0000 mg | PEN_INJECTOR | SUBCUTANEOUS | 1 refills | Status: DC

## 2023-04-08 MED ORDER — TIZANIDINE HCL 4 MG PO TABS
ORAL_TABLET | ORAL | 3 refills | Status: DC
Start: 2023-04-08 — End: 2024-03-29

## 2023-04-08 MED ORDER — LISINOPRIL 2.5 MG PO TABS
2.5000 mg | ORAL_TABLET | Freq: Every day | ORAL | 3 refills | Status: DC
Start: 2023-04-08 — End: 2024-03-29

## 2023-04-08 MED ORDER — DESONIDE 0.05 % EX CREA
TOPICAL_CREAM | Freq: Every day | CUTANEOUS | 2 refills | Status: DC | PRN
Start: 2023-04-08 — End: 2024-03-29

## 2023-04-08 MED ORDER — ATORVASTATIN CALCIUM 20 MG PO TABS
20.0000 mg | ORAL_TABLET | Freq: Every day | ORAL | 3 refills | Status: DC
Start: 2023-04-08 — End: 2024-03-29

## 2023-04-08 NOTE — Assessment & Plan Note (Signed)
Well controlled, chronic OSA on CPAP - Good adherence to CPAP nightly - Continue current CPAP therapy, patient seems to be benefiting from therapy  

## 2023-04-08 NOTE — Assessment & Plan Note (Signed)
Stable without recent exacerbation Followed by [facility] Continue current therapy as directed Has had steroid course Improved on Tizanidine - refills available.

## 2023-04-08 NOTE — Assessment & Plan Note (Signed)
Pending A1c CGM has improved his control overall On DexCOM G7  Steroid courses PRN for MS No evidence of hypoglycemia on GLP and Insulin Failed Metformin 1000mg  BID (GI intolerance), XR was ineffective Complications - other including dyslipidemia with low HDL and history of hypertriglyceridemia, obesity, OSA - increases risk of future cardiovascular complications / poor glucose control due to OSA  Weight is down  Plan:  1. Continue Ozempic 2mg  weekly inj 2. Continue jardiance 25mg  daily 3. Continue insulin Tresiba 12-16 units daily, has lowered and continues to adjust 4. Encourage improved lifestyle - low carb, low sugar diet, reduce portion size, continue improving regular exercise 5. Check CBG, bring log to next visit for review again 6. Continue Statin, ACE  Considering Future Mounjaro if indicated. Considering fast acting insulin for steroid injections instead of basal

## 2023-04-08 NOTE — Progress Notes (Signed)
Subjective:    Patient ID: Miguel [name], male    DOB: 01-18-1971, 52 y.o.   MRN: [id number]  Miguel [name] is a 52 y.o. male presenting on 04/08/2023 for Annual Exam   HPI  Discussed the use of AI scribe software for clinical note transcription with the patient, who gave verbal consent to proceed.      FOLLOW-UP CHRONIC DM, Type 2: See prior notes for background information. A1c due today Dexcom G7 CGM reviewed data,  Improved control Stress seems to be main factor to raise sugar as well as steroid injections He does skip insulin sometimes. Continues Ozempic 2mg  weekly inj, Jardiance 25mg  daily Insulin Tresiba down to 16 units - Remains OFF Metformin Future consideration of Mounjaro Currently on ACEi Lifestyle: Weight down 14 lbs in 6 months Improving Diet. - Exercise - back in gym at therapy 2 x week - Last DM Eye 10/2022 Denies hypoglycemia (rare 70-80 reading but had no symptoms)    HYPERLIPIDEMIA: Last lipid panel 03/2022, controlled LDL 77, TG improved < 200 Due for fasting lipid today - Currently taking Atorvastatin 20mg , tolerating well without side effects or myalgias   Chronic Neuropathic Pain / Back Pain / Multiple Sclerosis Followed by [facility] Neurology. See outside record for details. Continues on disease modifying therapy infusion every few months, rarely has flare up requiring bag steroid infusion. - Taking Gabapentin, Tizanidine, Tramadol - temporary relief - Has been receiving spinal injections as well - Significant improvement still on Tizanidine 2-4mg  dosage PRN with great results to help pain. Takes it early to prevent. Has rarely taken Baclofen PRN for spasms.   OSA, on CPAP - Patient reports prior history of dx OSA and on CPAP - Today reports that sleep apnea is well controlled.  uses the CPAP machine every night. Tolerates the machine well, and thinks that sleeps better with it and feels good. No new concerns or symptoms.   Health  Maintenance: Flu Shot today    Colonoscopy completed 02/26/23       04/08/2023    8:18 AM 09/30/2022    8:15 AM 07/01/2022    8:56 AM  Depression screen PHQ 2/9  Decreased Interest 0 0 0  Down, Depressed, Hopeless 0 0 0  PHQ - 2 Score 0 0 0  Altered sleeping 0  0  Tired, decreased energy 3  3  Change in appetite 0  0  Feeling bad or failure about yourself  0  0  Trouble concentrating 0  0  Moving slowly or fidgety/restless 0  0  Suicidal thoughts 0  0  PHQ-9 Score 3  3  Difficult doing work/chores   Not difficult at all       04/08/2023    8:18 AM 09/30/2022    8:15 AM 07/01/2022    8:56 AM 03/27/2022    8:09 AM  GAD 7 : Generalized Anxiety Score  Nervous, Anxious, on Edge 0 0 0 0  Control/stop worrying 0 0 0 0  Worry too much - different things 0 0 0 0  Trouble relaxing 0 0 0 0  Restless 0 0 0 0  Easily annoyed or irritable 0 0 0 0  Afraid - awful might happen 0 0 0 0  Total GAD 7 Score 0 0 0 0  Anxiety Difficulty   Not difficult at all Not difficult at all     Past Medical History:  Diagnosis Date   Allergy    Calculus of kidney  05/09/2013   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   Renal colic 05/09/2013   Shoulder pain 07/23/2015   Sleep apnea    Past Surgical History:  Procedure Laterality Date   COLONOSCOPY WITH PROPOFOL N/A 02/26/2023   Procedure: COLONOSCOPY WITH PROPOFOL;  Surgeon: [name], MD;  Location[facility] ENDOSCOPY;   Service: Gastroenterology;  Laterality: N/A;   HERNIA REPAIR     POLYPECTOMY  02/26/2023   Procedure: POLYPECTOMY;  Surgeon: [name], MD;  Location: [facility] ENDOSCOPY;  Service: Gastroenterology;;   URETEROSCOPY WITH HOLMIUM LASER LITHOTRIPSY     x 2   Social History   Socioeconomic History   Marital status: Married    Spouse name: Not on file   Number of children: Not on file   Years of education: Not on file   Highest education level: Not on file  Occupational History   Not on file  Tobacco Use   Smoking status: Never   Smokeless tobacco: Never  Substance and Sexual Activity   Alcohol use: No   Drug use: No   Sexual activity: Not on file  Other Topics Concern   Not on file  Social History Narrative   Not on file   Social Determinants of Health   Financial Resource Strain: Not on file  Food Insecurity: Not on file  Transportation Needs: Not on file  Physical Activity: Not on file  Stress: Not on file  Social Connections: Not on file  Intimate Partner Violence: Not on file   Family History  Problem Relation Age of Onset   Colon cancer Neg Hx    Prostate cancer Neg Hx    Bladder Cancer Neg Hx    Kidney cancer Neg Hx    Current Outpatient Medications on File Prior to Visit  Medication Sig   amphetamine-dextroamphetamine (ADDERALL) 20 MG tablet Take 20 mg by mouth daily.   baclofen (LIORESAL) 10 MG tablet Take 1 tablet by mouth 3 (three) times daily as needed.   cetirizine (ZYRTEC) 10 MG tablet Take 10 mg by mouth daily as needed for allergies.   Cholecalciferol (D 1000) 1000 units capsule Take by mouth.    Continuous Blood Gluc Sensor (DEXCOM G7 SENSOR) MISC Use as directed. Apply 1 sensor every 10 days.   Cyanocobalamin (VITAMIN B-12) 5000 MCG SUBL Place under the tongue.   dalfampridine 10 MG TB12    gabapentin (NEURONTIN) 300 MG capsule Take 300 mg by mouth 3 (three) times daily.   glucose blood (ONETOUCH ULTRA) test strip USE TO CHECK BLOOD SUGAR THREE TIMES  DAILY AS DIRECTED   ibuprofen (ADVIL,MOTRIN) 800 MG tablet Take 800 mg by mouth every 6 (six) hours as needed.   insulin degludec (TRESIBA FLEXTOUCH) 100 UNIT/ML FlexTouch Pen Inject 16 Units into the skin daily.   Insulin Pen Needle (B-D UF III MINI PEN NEEDLES) 31G X 5 MM MISC Use to inject insulin.  DX: E11.9   ipratropium (ATROVENT) 0.06 % nasal spray  USE 2 SPRAYS IN EACH NOSTRIL FOUR TIMES DAILY FOR UP TO 5 TO 7 DAYS THEN STOP   ketoconazole (NIZORAL) 2 % shampoo Apply 1 application topically daily as needed.   Lancets (ONETOUCH ULTRASOFT) lancets Check blood sugar three times daily.   Multiple Vitamins-Minerals (PRESERVISION AREDS PO) Take by mouth.   Ocrelizumab (OCREVUS IV) Inject into the vein.   traMADol (ULTRAM) 50 MG tablet Take 50 mg by mouth daily as needed.   No current facility-administered medications on file prior to visit.    Review of Systems  Constitutional:  Negative for activity change, appetite change, chills, diaphoresis, fatigue and fever.  HENT:  Negative for congestion and hearing loss.   Eyes:  Negative for visual disturbance.  Respiratory:  Negative for cough, chest tightness, shortness of breath and wheezing.   Cardiovascular:  Negative for chest pain, palpitations and leg swelling.  Gastrointestinal:  Negative for abdominal pain, constipation, diarrhea, nausea and vomiting.  Genitourinary:  Negative for dysuria, frequency and hematuria.  Musculoskeletal:  Negative for arthralgias and neck pain.  Skin:  Negative for rash.  Neurological:  Negative for dizziness, weakness, light-headedness, numbness and headaches.  Hematological:  Negative for adenopathy.  Psychiatric/Behavioral:  Negative for behavioral problems, dysphoric mood and sleep disturbance.    Per HPI unless specifically indicated above     Objective:    BP 100/60   Ht 5\' 10"  (1.778 m)   Wt 215 lb (97.5 kg)   BMI 30.85 kg/m   Wt Readings from Last 3 Encounters:  04/08/23 215 lb (97.5 kg)   02/26/23 219 lb (99.3 kg)  09/30/22 229 lb (103.9 kg)    Physical Exam Vitals and nursing note reviewed.  Constitutional:      General: He is not in acute distress.    Appearance: He is well-developed. He is not diaphoretic.     Comments: Well-appearing, comfortable, cooperative  HENT:     Head: Normocephalic and atraumatic.  Eyes:     General:        Right eye: No discharge.        Left eye: No discharge.     Conjunctiva/sclera: Conjunctivae normal.     Pupils: Pupils are equal, round, and reactive to light.  Neck:     Thyroid: No thyromegaly.     Vascular: No carotid bruit.  Cardiovascular:     Rate and Rhythm: Normal rate and regular rhythm.     Pulses: Normal pulses.     Heart sounds: Normal heart sounds. No murmur heard. Pulmonary:     Effort: Pulmonary effort is normal. No respiratory distress.     Breath sounds: Normal breath sounds. No wheezing or rales.  Abdominal:     General: Bowel sounds are normal. There is no distension.     Palpations: Abdomen is soft. There is no mass.     Tenderness: There is no abdominal tenderness.  Musculoskeletal:        General: No tenderness. Normal range of motion.     Cervical back: Normal range of motion and neck supple.     Right lower leg: No edema.     Left lower leg: No edema.     Comments: Upper / Lower Extremities: - Normal muscle tone, strength bilateral upper extremities 5/5, lower extremities 5/5  Lymphadenopathy:     Cervical: No cervical adenopathy.  Skin:    General: Skin is warm and dry.     Findings: No erythema or rash.  Neurological:  Mental Status: He is alert and oriented to person, place, and time.     Comments: Distal sensation intact to light touch all extremities  Psychiatric:        Mood and Affect: Mood normal.        Behavior: Behavior normal.        Thought Content: Thought content normal.     Comments: Well groomed, good eye contact, normal speech and thoughts      Recent Labs     07/01/22 0849 09/30/22 0828  HGBA1C 7.8* 6.8*   Diabetic Foot Exam - Simple   Simple Foot Form Diabetic Foot exam was performed with the following findings: Yes 04/08/2023  8:18 AM  Visual Inspection See comments: Yes Sensation Testing Intact to touch and monofilament testing bilaterally: Yes Pulse Check Posterior Tibialis and Dorsalis pulse intact bilaterally: Yes Comments Bilateral great toe plantar lateral aspect callus, mild, improved. No ulceration. Intact monofilament.     Results for orders placed or performed during the hospital encounter of 02/26/23  Glucose, capillary  Result Value Ref Range   Glucose-Capillary 135 (H) 70 - 99 mg/dL  Surgical pathology  Result Value Ref Range   SURGICAL PATHOLOGY      SURGICAL PATHOLOGY [facility] [address], [address] [place], [place] [address] Telephone [phone] or [phone] Fax [phone]  REPORT OF SURGICAL PATHOLOGY   Accession #: [id number] Patient Name: KHADYN, GROHMAN Visit # : [id number]  MRN: [id number] Physician: [name] DOB/Age May 28, 1970 (Age: 58) Gender: M Collected Date: 02/26/2023 Received Date: 02/26/2023  FINAL DIAGNOSIS       1. Ascending  Colon Polyp, cbx :       - HYPERPLASTIC POLYP.      - NEGATIVE FOR DYSPLASIA AND MALIGNANCY.       DATE SIGNED OUT: 02/27/2023 ELECTRONIC SIGNATURE : Oneita Kras Md, Delice Bison , Pathologist, Electronic Signature  MICROSCOPIC DESCRIPTION  CASE COMMENTS STAINS USED IN DIAGNOSIS: H&E    CLINICAL HISTORY  SPECIMEN(S) OBTAINED 1. Ascending  Colon Polyp, Cbx  SPECIMEN COMMENTS: SPECIMEN CLINICAL INFORMATION: 1. Screening colonoscopy, colon polyp    Gross Description 1. Received in formalin, labeled "CBX polyp ascending  colon", is a 0.4 x 0.2 x 0.2 cm soft, tan tissue fragments submitted entirely in block 1A.      SMB      02/26/2023        Report signed out from the following location(s) Phil Campbell. [facility] [address] [place], [place] [address] CLIA #: [id number]  [facility] [address] [place], [place] [address] CLIA #: [id number]       Assessment & Plan:   Problem List Items Addressed This Visit     Hyperlipidemia associated with type 2 diabetes mellitus (HCC)    Due for lipid panel  Elevated LDL Last lipid panel 02/2022 The 10-year ASCVD risk score (Arnett DK, et al., 2019) is: 4.5%   Plan: 1. Continue current meds - Atorvastatin 20mg  daily 2. Continue ASA 81mg  for primary ASCVD risk reduction 3. Encourage improved lifestyle - low carb/cholesterol, reduce portion size, continue improving regular exercise 4. Follow-up lipids yearly      Relevant Medications   atorvastatin (LIPITOR) 20 MG tablet   OZEMPIC, 2 MG/DOSE, 8 MG/3ML SOPN   lisinopril (ZESTRIL) 2.5 MG tablet   empagliflozin (JARDIANCE) 25 MG TABS tablet   Other Relevant Orders   Lipid panel   COMPLETE METABOLIC PANEL WITH GFR   TSH  Multiple sclerosis (HCC)    Stable without recent exacerbation Followed by [facility] Neurology Continue current therapy as directed Has had steroid course Improved on Tizanidine - refills available.      Relevant Orders   COMPLETE METABOLIC PANEL WITH GFR   CBC with Differential/Platelet   OSA on CPAP    Well controlled, chronic OSA on CPAP - Good adherence to CPAP nightly - Continue current CPAP therapy, patient seems to be benefiting from therapy       Type 2 diabetes mellitus with other specified complication (HCC)    Pending A1c CGM has improved his control overall On DexCOM G7  Steroid courses PRN for MS No evidence of hypoglycemia on GLP and Insulin Failed Metformin 1000mg  BID (GI intolerance), XR was ineffective Complications - other including dyslipidemia with low HDL and history of hypertriglyceridemia, obesity, OSA - increases risk of future cardiovascular complications / poor glucose control due to OSA  Weight is  down  Plan:  1. Continue Ozempic 2mg  weekly inj 2. Continue jardiance 25mg  daily 3. Continue insulin Tresiba 12-16 units daily, has lowered and continues to adjust 4. Encourage improved lifestyle - low carb, low sugar diet, reduce portion size, continue improving regular exercise 5. Check CBG, bring log to next visit for review again 6. Continue Statin, ACE  Considering Future Mounjaro if indicated. Considering fast acting insulin for steroid injections instead of basal      Relevant Medications   atorvastatin (LIPITOR) 20 MG tablet   OZEMPIC, 2 MG/DOSE, 8 MG/3ML SOPN   lisinopril (ZESTRIL) 2.5 MG tablet   empagliflozin (JARDIANCE) 25 MG TABS tablet   Other Visit Diagnoses     Annual physical exam    -  Primary   Relevant Orders   Hemoglobin A1c   Lipid panel   COMPLETE METABOLIC PANEL WITH GFR   CBC with Differential/Platelet   PSA   Needs flu shot       Relevant Orders   Flu vaccine trivalent PF, 6mos and older(Flulaval,Afluria,Fluarix,Fluzone) (Completed)   Controlled type 2 diabetes mellitus with complication, with long-term current use of insulin (HCC)       Relevant Medications   atorvastatin (LIPITOR) 20 MG tablet   OZEMPIC, 2 MG/DOSE, 8 MG/3ML SOPN   lisinopril (ZESTRIL) 2.5 MG tablet   empagliflozin (JARDIANCE) 25 MG TABS tablet   Other Relevant Orders   Hemoglobin A1c   Microalbumin / creatinine urine ratio   Morbid obesity (HCC)       Relevant Medications   OZEMPIC, 2 MG/DOSE, 8 MG/3ML SOPN   empagliflozin (JARDIANCE) 25 MG TABS tablet   Other Relevant Orders   COMPLETE METABOLIC PANEL WITH GFR   Screening for prostate cancer       Relevant Orders   PSA   Spondylosis of thoracic region without myelopathy or radiculopathy       Relevant Medications   tiZANidine (ZANAFLEX) 4 MG tablet   Spasm of thoracic back muscle       Relevant Medications   tiZANidine (ZANAFLEX) 4 MG tablet   Diabetes mellitus due to underlying condition with hyperosmolarity  without coma, with long-term current use of insulin (HCC)       Relevant Medications   atorvastatin (LIPITOR) 20 MG tablet   OZEMPIC, 2 MG/DOSE, 8 MG/3ML SOPN   lisinopril (ZESTRIL) 2.5 MG tablet   empagliflozin (JARDIANCE) 25 MG TABS tablet   Tinea versicolor       Consistent on exam. Will refill prior Ketoconazole shampoo rx from Dermatology for  same problem   Relevant Medications   desonide (DESOWEN) 0.05 % cream        Updated Health Maintenance information Fasting labs today Encouraged improvement to lifestyle with diet and exercise Goal of weight loss   Musculoskeletal Pain Patient reports benefit from pain injections, particularly in the hip. Patient is on tizanidine and baclofen, but reports more benefit from tizanidine. -Continue current medications.  Vision Changes Patient reports difficulty with close vision, but has been told he has 20/20 vision. Patient uses readers for close vision. -No changes to current management. Optometry as needed  General Health Maintenance -Administer influenza vaccine today. -Next appointment scheduled for May 20th, 2025.      Will review chart with our clinical pharmacist Estelle Grumbles [facility] CPP and consider if any advantage of fast acting insulin for steroid courses for MS instead of basal. With improving glycemic control overall, he seems to rely less on basal dosing and seems to have limited effect, as he has often skipped dosing and has remained stable.   Orders Placed This Encounter  Procedures   Flu vaccine trivalent PF, 6mos and older(Flulaval,Afluria,Fluarix,Fluzone)   Hemoglobin A1c   Lipid panel    Order Specific Question:   Has the patient fasted?    Answer:   Yes   COMPLETE METABOLIC PANEL WITH GFR   CBC with Differential/Platelet   PSA   Microalbumin / creatinine urine ratio   TSH    Meds ordered this encounter  Medications   atorvastatin (LIPITOR) 20 MG tablet    Sig: Take 1 tablet (20 mg total) by mouth  daily.    Dispense:  90 tablet    Refill:  3    Add refills   tiZANidine (ZANAFLEX) 4 MG tablet    Sig: TAKE 1/2 TO 1 TABLET(2 TO 4 MG) BY MOUTH EVERY 8 HOURS AS NEEDED FOR MUSCLE SPASMS    Dispense:  60 tablet    Refill:  3   OZEMPIC, 2 MG/DOSE, 8 MG/3ML SOPN    Sig: Inject 2 mg as directed once a week.    Dispense:  9 mL    Refill:  1   lisinopril (ZESTRIL) 2.5 MG tablet    Sig: Take 1 tablet (2.5 mg total) by mouth daily.    Dispense:  90 tablet    Refill:  3    Add refills   empagliflozin (JARDIANCE) 25 MG TABS tablet    Sig: Take 1 tablet (25 mg total) by mouth daily.    Dispense:  90 tablet    Refill:  3   desonide (DESOWEN) 0.05 % cream    Sig: Apply topically daily as needed.    Dispense:  30 g    Refill:  2     Follow up plan: Return in about 6 months (around 10/06/2023) for 6 month DM A1c.  Saralyn Pilar, DO [facility] 04/08/2023, 8:10 AM

## 2023-04-08 NOTE — Assessment & Plan Note (Signed)
Due for lipid panel  Elevated LDL Last lipid panel 02/2022 The 10-year ASCVD risk score (Arnett DK, et al., 2019) is: 4.5%   Plan: 1. Continue current meds - Atorvastatin 20mg  daily 2. Continue ASA 81mg  for primary ASCVD risk reduction 3. Encourage improved lifestyle - low carb/cholesterol, reduce portion size, continue improving regular exercise 4. Follow-up lipids yearly

## 2023-04-08 NOTE — Patient Instructions (Addendum)
Thank you for coming to the office today.  Refills added today Flu Shot today Labs, pending results. Stay tuned on mychart.  Please schedule a Follow-up Appointment to: Return in about 6 months (around 10/06/2023) for 6 month DM A1c.  If you have any other questions or concerns, please feel free to call the office or send a message through MyChart. You may also schedule an earlier appointment if necessary.  Additionally, you may be receiving a survey about your experience at our office within a few days to 1 week by e-mail or mail. We value your feedback.  Saralyn Pilar, DO [facility], [place]

## 2023-04-09 LAB — LIPID PANEL
Cholesterol: 139 mg/dL (ref <200)
HDL: 37 mg/dL — ABNORMAL LOW (ref >=40)
LDL Cholesterol (Calc): 85 mg/dL
Non-HDL Cholesterol (Calc): 102 mg/dL (ref <130)
Total CHOL/HDL Ratio: 3.8 (calc) (ref <5.0)
Triglycerides: 83 mg/dL (ref <150)

## 2023-04-09 LAB — COMPLETE METABOLIC PANEL WITHOUT GFR
AG Ratio: 2.2 (calc) (ref 1.0–2.5)
ALT: 23 U/L (ref 9–46)
AST: 18 U/L (ref 10–35)
Albumin: 4.7 g/dL (ref 3.6–5.1)
Alkaline phosphatase (APISO): 89 U/L (ref 35–144)
BUN: 13 mg/dL (ref 7–25)
CO2: 28 mmol/L (ref 20–32)
Calcium: 9.7 mg/dL (ref 8.6–10.3)
Chloride: 104 mmol/L (ref 98–110)
Creat: 0.79 mg/dL (ref 0.70–1.30)
Globulin: 2.1 g/dL (ref 1.9–3.7)
Glucose, Bld: 127 mg/dL — ABNORMAL HIGH (ref 65–99)
Potassium: 4.5 mmol/L (ref 3.5–5.3)
Sodium: 140 mmol/L (ref 135–146)
Total Bilirubin: 1.3 mg/dL — ABNORMAL HIGH (ref 0.2–1.2)
Total Protein: 6.8 g/dL (ref 6.1–8.1)
eGFR: 108 mL/min/1.73m2

## 2023-04-09 LAB — HEMOGLOBIN A1C
Hgb A1c MFr Bld: 7.3 %{Hb} — ABNORMAL HIGH (ref <5.7)
Mean Plasma Glucose: 163 mg/dL
eAG (mmol/L): 9 mmol/L

## 2023-04-09 LAB — CBC WITH DIFFERENTIAL/PLATELET
Absolute Lymphocytes: 1224 {cells}/uL (ref 850–3900)
Absolute Monocytes: 525 {cells}/uL (ref 200–950)
Basophils Absolute: 31 {cells}/uL (ref 0–200)
Basophils Relative: 0.6 %
Eosinophils Absolute: 82 {cells}/uL (ref 15–500)
Eosinophils Relative: 1.6 %
HCT: 50.2 % — ABNORMAL HIGH (ref 38.5–50.0)
Hemoglobin: 17.1 g/dL (ref 13.2–17.1)
MCH: 31.7 pg (ref 27.0–33.0)
MCHC: 34.1 g/dL (ref 32.0–36.0)
MCV: 93 fL (ref 80.0–100.0)
MPV: 10.5 fL (ref 7.5–12.5)
Monocytes Relative: 10.3 %
Neutro Abs: 3239 {cells}/uL (ref 1500–7800)
Neutrophils Relative %: 63.5 %
Platelets: 225 10*3/uL (ref 140–400)
RBC: 5.4 10*6/uL (ref 4.20–5.80)
RDW: 11.9 % (ref 11.0–15.0)
Total Lymphocyte: 24 %
WBC: 5.1 10*3/uL (ref 3.8–10.8)

## 2023-04-09 LAB — TSH: TSH: 0.89 m[IU]/L (ref 0.40–4.50)

## 2023-04-09 LAB — MICROALBUMIN / CREATININE URINE RATIO
Creatinine, Urine: 96 mg/dL (ref 20–320)
Microalb Creat Ratio: 8 mg/g{creat}
Microalb, Ur: 0.8 mg/dL

## 2023-04-09 LAB — PSA: PSA: 0.42 ng/mL

## 2023-04-14 ENCOUNTER — Encounter: Payer: BC Managed Care – PPO | Admitting: Family Medicine

## 2023-04-15 ENCOUNTER — Encounter: Payer: BC Managed Care – PPO | Admitting: Family Medicine

## 2023-04-15 ENCOUNTER — Telehealth: Payer: Self-pay | Admitting: Pharmacist

## 2023-04-15 NOTE — Progress Notes (Signed)
   Outreach Note  04/15/2023 Name: CONARD MCCROHAN MRN: [id number] DOB: [birth year]  Referred by: Smitty Cords, DO  Was unable to reach patient via telephone today and have left HIPAA compliant voicemail asking patient to return my call.    Follow Up Plan: Will attempt to reach patient by telephone again within the next 14 days  Estelle Grumbles, PharmD, Patsy [place], CPP Clinical Pharmacist [facility] [phone]

## 2023-05-01 ENCOUNTER — Telehealth: Payer: Self-pay | Admitting: Pharmacist

## 2023-05-01 NOTE — Telephone Encounter (Signed)
   Outreach Note  05/01/2023 Name: BABLU PHILIP MRN: [id number] DOB: [birth year]  Referred by: Smitty Cords, DO  Was unable to reach patient via telephone today and have left HIPAA compliant voicemail asking patient to return my call.    Follow Up Plan: Will attempt to reach patient by telephone on 05/22/2023 at 9:15 AM   Estelle Grumbles, PharmD, Patsy [place], CPP Clinical Pharmacist [facility] [phone]

## 2023-05-07 ENCOUNTER — Other Ambulatory Visit: Payer: Self-pay | Admitting: Internal Medicine

## 2023-05-07 DIAGNOSIS — E1169 Type 2 diabetes mellitus with other specified complication: Secondary | ICD-10-CM

## 2023-05-08 NOTE — Telephone Encounter (Signed)
Requested Prescriptions  Pending Prescriptions Disp Refills   TRESIBA FLEXTOUCH 100 UNIT/ML FlexTouch Pen [Pharmacy Med Name: TRESIBA FLEXTOUCH PEN ([id number]] 15 mL 0    Sig: ADMINISTER 16 UNITS UNDER THE SKIN DAILY     Endocrinology:  Diabetes - Insulins Passed - 05/08/2023  8:42 AM      Passed - HBA1C is between 0 and 7.9 and within 180 days    Hgb A1c MFr Bld  Date Value Ref Range Status  04/08/2023 7.3 (H) <5.7 % of total Hgb Final    Comment:    For someone without known diabetes, a hemoglobin A1c value of 6.5% or greater indicates that they may have  diabetes and this should be confirmed with a follow-up  test. . For someone with known diabetes, a value <7% indicates  that their diabetes is well controlled and a value  greater than or equal to 7% indicates suboptimal  control. A1c targets should be individualized based on  duration of diabetes, age, comorbid conditions, and  other considerations. . Currently, no consensus exists regarding use of hemoglobin A1c for diagnosis of diabetes for children. [name] - Valid encounter within last 6 months    Recent Outpatient Visits           1 month ago Annual physical exam   [facility] [place], [name], DO   3 months ago Controlled type 2 diabetes mellitus with complication, with long-term current use of insulin [facility])   [place] [facility] [place], [name], RPH-CPP   6 months ago Hyperlipidemia associated with type 2 diabetes mellitus [facility])   [facility] [place], [name], RPH-CPP   7 months ago Type 2 diabetes mellitus with other specified complication, with long-term current use of insulin [facility])   [facility] [place], [name], DO   8 months ago Type 2 diabetes mellitus with other specified complication, with long-term current use of insulin [facility])   [place] [facility] [place], [name], RPH-CPP       Future Appointments             In 5 months [name], [name], DO [facility], [facility]

## 2023-05-22 ENCOUNTER — Ambulatory Visit (INDEPENDENT_AMBULATORY_CARE_PROVIDER_SITE_OTHER): Payer: 59 | Admitting: Pharmacist

## 2023-05-22 DIAGNOSIS — E118 Type 2 diabetes mellitus with unspecified complications: Secondary | ICD-10-CM

## 2023-05-22 DIAGNOSIS — Z794 Long term (current) use of insulin: Secondary | ICD-10-CM

## 2023-05-22 NOTE — Progress Notes (Signed)
 05/22/2023 Name: Miguel [name] MRN: [id number] DOB: 1970-08-10  Chief Complaint  Patient presents with   Medication Management    Miguel [name] is a 53 y.o. year old male who presented for a telephone visit.   They were referred to the pharmacist by their PCP for assistance in managing diabetes and medication access.      Subjective:   Care Team: Primary Care Provider: Edman Marsa PARAS, DO ; Next Scheduled Visit: 10/06/2023 Pain Management: Dr. Oliva [facility]; Next Scheduled Visit: 07/24/2023 Neurology: [facility] Urology: Penne Knee, MD [facility]  Medication Access/Adherence  Current Pharmacy:  [facility] #[id number] - [place], [place] - [address] AT [facility] OF [address] [facility] [address] [place] [address] Phone: [phone] Fax: [phone]  [facility] - [place], [place] - [address] [place] [address] Phone: [phone] Fax: [phone]   Type 2 Diabetes:   Current medications:  Ozempic  2 mg weekly on Sundays  Tresiba  FlexTouch 18 units daily in evening Jardiance  25 mg daily with breakfast   Medications tried in the past: metformin  (IR GI intolerance/felt ER at tolerated dose was ineffective)    Note blood sugar control impacted by receiving steroid infusions and injections: Receives steroid injections ~every 3 months for pain management and steroid infusions every 6 months with multiple sclerosis treatment (Ocrelizumab infusion)   Patient uses Dexcom G7 Continuous Glucose Monitoring (CGM), but reports recently had a gap in sensor use as was traveling and had to wait until returned to town to replace sensor (restarted yesterday).   Recalls recent morning fasting blood sugar readings ranging 110-140   Attributes elevations in blood sugar readings to steroid infusions/injections, stress and eating habits   Uses CGM as tool to  reinforce/aid with dietary choices and improve blood sugar control   Current physical activity: Reports increasing activity since hip pain has improved due to latest hip injection to his left hip in October/having more energy. Walking throughout the day at work/at home walking ~30 minutes x most days/week. Starting PT on 06/20/2023   Reports continues to work on weight loss, current home weight ~213 lbs   Statin therapy: atorvastatin  20 mg daily   Patient denies hypoglycemic s/sx including dizziness, shakiness, sweating.  Confirms carries glucose tablets in case of symptoms of hypoglycemia     Objective:  Lab Results  Component Value Date   HGBA1C 7.3 (H) 04/08/2023    Lab Results  Component Value Date   CREATININE 0.79 04/08/2023   BUN 13 04/08/2023   NA 140 04/08/2023   K 4.5 04/08/2023   CL 104 04/08/2023   CO2 28 04/08/2023    Lab Results  Component Value Date   CHOL 139 04/08/2023   HDL 37 (L) 04/08/2023   LDLCALC 85 04/08/2023   TRIG 83 04/08/2023   CHOLHDL 3.8 04/08/2023    Medications Reviewed Today     Reviewed by Alana Sharyle LABOR, RPH-CPP (Pharmacist) on 05/22/23 at 1258  Med List Status: <None>   Medication Order Taking? Sig Documenting Provider Last Dose Status Informant  amphetamine-dextroamphetamine (ADDERALL) 20 MG tablet [id number]  Take 20 mg by mouth daily. [provider]  Active   atorvastatin  (LIPITOR) 20 MG tablet [id number]  Take 1 tablet (20 mg total) by mouth daily. Karamalegos, Marsa PARAS, DO  Active   baclofen (LIORESAL) 10 MG tablet [id number]  Take 1 tablet by  mouth 3 (three) times daily as needed. [provider]  Active   cetirizine (ZYRTEC) 10 MG tablet [id number]  Take 10 mg by mouth daily as needed for allergies. [provider]  Active   Cholecalciferol (D 1000) 1000 units capsule [id number]  Take by mouth.  [provider]  Active            Med Note DERREL, JAMIE A   Thu May 10, 2015  9:44 AM)  Received from: [facility]  Continuous Blood Gluc Sensor (DEXCOM G7 SENSOR) [place] [id number]  Use as directed. Apply 1 sensor every 10 days. Edman Marsa PARAS, DO  Active   Cyanocobalamin (VITAMIN B-12) 5000 MCG SUBL [id number]  Place under the tongue. [provider]  Active            Med Note DERREL, JAMIE A   Thu May 10, 2015  9:44 AM) Received from: [facility]  dalfampridine 10 [place] UA87 [id number]   [provider]  Active   desonide  (DESOWEN ) 0.05 % cream [id number]  Apply topically daily as needed. Edman Marsa PARAS, DO  Active   empagliflozin  (JARDIANCE ) 25 MG TABS tablet [id number] Yes Take 1 tablet (25 mg total) by mouth daily. Edman Marsa PARAS, DO Taking Active   gabapentin (NEURONTIN) 300 MG capsule [id number]  Take 300 mg by mouth 3 (three) times daily. [provider]  Active            Med Note DERREL, JAMIE A   Thu May 10, 2015  9:44 AM) Received from: [facility]  glucose blood [facility] ULTRA) test strip [id number]  USE TO CHECK BLOOD SUGAR THREE TIMES DAILY AS DIRECTED Edman Marsa PARAS, DO  Active   ibuprofen (ADVIL,MOTRIN) 800 MG tablet [id number]  Take 800 mg by mouth every 6 (six) hours as needed. [provider]  Active            Med Note DERREL, JAMIE A   Thu May 10, 2015  9:44 AM) Received from: [facility]  Insulin  Pen Needle (B-D UF III MINI PEN NEEDLES) 31G X 5 MM MISC [id number]  Use to inject insulin .  DX: E11.9 Edman Marsa PARAS, DO  Active   ipratropium (ATROVENT ) 0.06 % nasal spray [id number]  USE 2 SPRAYS IN EACH NOSTRIL FOUR TIMES DAILY FOR UP TO 5 TO 7 DAYS THEN STOP Karamalegos, Marsa PARAS, DO  Active   ketoconazole  (NIZORAL ) 2 % shampoo [id number]  Apply 1 application topically daily as needed. Edman Marsa PARAS, DO  Active   Lancets [facility] ULTRASOFT) lancets [id number]  Check blood sugar three times daily. Edman Marsa PARAS, DO  Active   lisinopril  (ZESTRIL ) 2.5  MG tablet [id number]  Take 1 tablet (2.5 mg total) by mouth daily. Edman Marsa PARAS, DO  Active   Multiple Vitamins-Minerals (PRESERVISION AREDS PO) [id number]  Take by mouth. [provider]  Active   Ocrelizumab (OCREVUS IV) [id number]  Inject into the vein. [provider]  Active   OZEMPIC , 2 MG/DOSE, 8 MG/3ML SOPN [id number] Yes Inject 2 mg as directed once a week. Edman Marsa PARAS, DO Taking Active   tiZANidine  (ZANAFLEX ) 4 MG tablet [id number]  TAKE 1/2 TO 1 TABLET(2 TO 4 MG) BY MOUTH EVERY 8 HOURS AS NEEDED FOR MUSCLE SPASMS Karamalegos, Marsa PARAS, DO  Active   traMADol (ULTRAM) 50 MG tablet [id number]  Take 50 mg by mouth daily as needed. [provider]  Active   TRESIBA  FLEXTOUCH 100  UNIT/ML FlexTouch Pen [id number] Yes ADMINISTER 16 UNITS UNDER THE SKIN DAILY  Patient taking differently: Inject 18 Units into the skin at bedtime.   Antonette Angeline ORN, NP Taking Active               Assessment/Plan:   Diabetes: - Patient interested in making change from Ozempic  to Mounjaro  to see if can achieve greater blood sugar control with Mounjaro  and allow for decreasing insulin  dose in future  Prefers to make this change next month, once has used up current supply of Ozempic  Submit prior authorization request via Covermymeds today. PA approved through 05/20/2026 - Discuss with patient options regarding basal insulin  regimen. Received message from PCP advising patient interested in possibly changing insulin  regimen to allow more flexibility to make adjustments to cover when has steroid infusions Note patient currently on Tresiba  ultra-long-acting insulin  (with duration of action at least 42 hours) Discuss possible change back to (shorter) long-acting insulin  glargine to allow for more flexible adjustments around steroid infusions Patient verbalizes understanding, but states would like to stay on Tresiba  for now to avoid making multiple changes, but consider  this adjustment again in the future - Have reviewed dietary modifications including having regular well-balanced meals throughout the day and controlling carbohydrate portion sizes - Recommend to continue to use CGM as tool to reinforce/aid with dietary choices and improve blood sugar control Patient to continue to use glucometer to complete fingerstick reading when needed for symptoms/unsure of CGM data or if instructed by CGM device - Have reviewed symptoms of/how to manage low blood sugar. Encourage patient to continue to carry glucose tablets     Follow Up Plan: Clinic Pharmacist will follow up with patient by telephone on 06/22/2023 at 9:30 AM     Sharyle Sia, PharmD, JAQUELINE, CPP Clinical Pharmacist [facility] [phone]

## 2023-05-22 NOTE — Patient Instructions (Signed)
Goals Addressed             This Visit's Progress    Pharmacy Goals       Our goal A1c is less than 7%. This corresponds with fasting sugars less than 130 and 2 hour after meal sugars less than 180. Please check your blood sugar and keep a log of the results  Our goal bad cholesterol, or LDL, is less than 70 . This is why it is important to continue taking your atorvastatin.  Feel free to call me with any questions or concerns. I look forward to our next call!  Elisabeth Delles, PharmD, BCACP, CPP Clinical Pharmacist [facility] [phone]        

## 2023-06-04 ENCOUNTER — Other Ambulatory Visit: Payer: Self-pay | Admitting: Family Medicine

## 2023-06-04 DIAGNOSIS — Z794 Long term (current) use of insulin: Secondary | ICD-10-CM

## 2023-06-04 NOTE — Telephone Encounter (Signed)
Reordered 03/2023 #90 3 RF and was sent to a requested pharmacy  Requested Prescriptions  Refused Prescriptions Disp Refills   JARDIANCE 25 MG TABS tablet [Pharmacy Med Name: JARDIANCE 25MG  TABLETS] 90 tablet 3    Sig: TAKE 1 TABLET(25 MG) BY MOUTH DAILY BEFORE BREAKFAST     Endocrinology:  Diabetes - SGLT2 Inhibitors Passed - 06/04/2023  1:32 PM      Passed - Cr in normal range and within 360 days    Creat  Date Value Ref Range Status  04/08/2023 0.79 0.70 - 1.30 mg/dL Final   Creatinine, Urine  Date Value Ref Range Status  04/08/2023 96 20 - 320 mg/dL Final         Passed - HBA1C is between 0 and 7.9 and within 180 days    Hgb A1c MFr Bld  Date Value Ref Range Status  04/08/2023 7.3 (H) <5.7 % of total Hgb Final    Comment:    For someone without known diabetes, a hemoglobin A1c value of 6.5% or greater indicates that they may have  diabetes and this should be confirmed with a follow-up  test. . For someone with known diabetes, a value <7% indicates  that their diabetes is well controlled and a value  greater than or equal to 7% indicates suboptimal  control. A1c targets should be individualized based on  duration of diabetes, age, comorbid conditions, and  other considerations. . Currently, no consensus exists regarding use of hemoglobin A1c for diagnosis of diabetes for children. .          Passed - eGFR in normal range and within 360 days    GFR, Est African American  Date Value Ref Range Status  03/15/2020 125 > OR = 60 mL/min/1.8m2 Final   GFR, Est Non African American  Date Value Ref Range Status  03/15/2020 108 > OR = 60 mL/min/1.40m2 Final   eGFR  Date Value Ref Range Status  04/08/2023 108 > OR = 60 mL/min/1.80m2 Final         Passed - Valid encounter within last 6 months    Recent Outpatient Visits           1 week ago Controlled type 2 diabetes mellitus with complication, with long-term current use of insulin (HCC)   [place] [facility] Delles, [name], RPH-CPP   1 month ago Annual physical exam   [facility] [place], [name], DO   4 months ago Controlled type 2 diabetes mellitus with complication, with long-term current use of insulin [facility])   [place] [facility] Delles, [name], RPH-CPP   7 months ago Hyperlipidemia associated with type 2 diabetes mellitus [facility])   [place] [facility] Delles, [name], RPH-CPP   8 months ago Type 2 diabetes mellitus with other specified complication, with long-term current use of insulin [facility])   [place] [facility] [place], [name], DO       Future Appointments             In 4 months [name], [name], DO [place] [facility], [facility]

## 2023-06-12 NOTE — Progress Notes (Addendum)
PA started   [employer] (Key: [id number]) Rx #: [id number] Need Help? Call us at [phone] Status New (Not sent to plan) Drug Dexcom G7 Sensor ePA cloud logo Form [employer] Electronic PA Form [phone] NCPDP) Original Claim Info 75    Approved 06/12/23-06/11/24

## 2023-06-22 ENCOUNTER — Encounter: Payer: Self-pay | Admitting: Pharmacist

## 2023-06-22 ENCOUNTER — Other Ambulatory Visit: Payer: Self-pay | Admitting: Pharmacist

## 2023-06-22 DIAGNOSIS — E118 Type 2 diabetes mellitus with unspecified complications: Secondary | ICD-10-CM

## 2023-06-22 DIAGNOSIS — Z794 Long term (current) use of insulin: Secondary | ICD-10-CM

## 2023-06-22 MED ORDER — MOUNJARO 5 MG/0.5ML ~~LOC~~ SOAJ: 5.0000 mg | SUBCUTANEOUS | 0 refills | Status: DC

## 2023-06-22 NOTE — Progress Notes (Unsigned)
06/22/2023 Name: Miguel [name] MRN: [id number] DOB: [birth year]  Chief Complaint  Patient presents with   Medication Management   Medication Assistance    Miguel [name] is a 53 y.o. year old male who presented for a telephone visit.   They were referred to the pharmacist by their PCP for assistance in managing diabetes and medication access.      Subjective:   Care Team: Primary Care Provider: Smitty Cords, DO ; Next Scheduled Visit: 10/06/2023 Pain Management: Dr. Pecolia Ades[facility]; Next Scheduled Visit: 07/24/2023 Neurology: [facility] Urology: Vanna Scotland, MD [facility]  Medication Access/Adherence  Current Pharmacy:  [facility] #[id number] - [place], [place] - [address] AT [facility] OF [address] [facility] [address] [place] [address] Phone: [phone] Fax: [phone]  [facility] - [place], [place] - [address] [place] [address] Phone: [phone] Fax: [phone]   Type 2 Diabetes:   Current medications:  Ozempic 2 mg weekly on Sundays  Tresiba FlexTouch 18 units daily in evening Jardiance 25 mg daily with breakfast    Medications tried in the past: metformin (IR GI intolerance/felt ER at tolerated dose was ineffective)    Note blood sugar control impacted by receiving steroid infusions and injections: Receives steroid injections ~every 3 months for pain management and steroid infusions every 6 months with multiple sclerosis treatment (Ocrelizumab infusion)   Patient uses Dexcom G7 Continuous Glucose Monitoring (CGM),    Date of Download: 06/22/23 % Time CGM is active: 85.7% Average Glucose: 201 mg/dL Glucose Management Indicator: 8.1%  Glucose Variability: 18.8% (goal <36%) Time in Goal:  - Time in range 70-180: 33% - Time above range: 67% - Time below range: 0%  Patient request to make switch from Ozempic to [facility] 5 mg  weekly as previously discussed    Attributes elevations in blood sugar readings to steroid infusions/injections, stress and eating habits, particularly as has been traveling more recently - Reports would like to talk to PCP to see if might be appropriate to try a medication to help with his stress management    Uses CGM as tool to reinforce/aid with dietary choices and improve blood sugar control   Current physical activity: Reports increasing activity since hip pain has improved due to latest hip injection to his left hip in October/having more energy. Walking throughout the day at work/at home walking ~30 minutes x most days/week. Starting PT again (twice weekly)   Reports continues to work on weight loss, current home weight ~215 lbs   Statin therapy: atorvastatin 20 mg daily   Patient denies hypoglycemic s/sx including dizziness, shakiness, sweating.  Confirms carries glucose tablets in case of symptoms of hypoglycemia       Objective:  Lab Results  Component Value Date   HGBA1C 7.3 (H) 04/08/2023    Lab Results  Component Value Date   CREATININE 0.79 04/08/2023   BUN 13 04/08/2023   NA 140 04/08/2023   K 4.5 04/08/2023   CL 104 04/08/2023   CO2 28 04/08/2023    Lab Results  Component Value Date   CHOL 139 04/08/2023   HDL 37 (L) 04/08/2023   LDLCALC 85 04/08/2023   TRIG 83 04/08/2023   CHOLHDL 3.8 04/08/2023    Medications Reviewed Today     Reviewed by Manuela Neptune, RPH-CPP (Pharmacist) on 06/22/23 at 0957  Med List Status: <None>  Medication Order Taking? Sig Documenting Provider Last Dose Status Informant  amphetamine-dextroamphetamine (ADDERALL) 20 MG tablet [id number]  Take 20 mg by mouth daily. [provider]  Active   atorvastatin (LIPITOR) 20 MG tablet [id number]  Take 1 tablet (20 mg total) by mouth daily. Karamalegos, [name], DO  Active   baclofen (LIORESAL) 10 MG tablet [id number]  Take 1 tablet by mouth 3 (three) times daily as  needed. [provider]  Active   cetirizine (ZYRTEC) 10 MG tablet [id number]  Take 10 mg by mouth daily as needed for allergies. [provider]  Active   Cholecalciferol (D 1000) 1000 units capsule [id number]  Take by mouth.  [provider]  Active            Med Note Francesco Runner, JAMIE A   Thu May 10, 2015  9:44 AM) Received from: [facility]  Continuous Blood Gluc Sensor (DEXCOM G7 SENSOR) [place] [id number]  Use as directed. Apply 1 sensor every 10 days. Smitty Cords, DO  Active   Cyanocobalamin (VITAMIN B-12) 5000 MCG SUBL [id number]  Place under the tongue. [provider]  Active            Med Note Francesco Runner, JAMIE A   Thu May 10, 2015  9:44 AM) Received from: [facility]  dalfampridine 10 [place] VQ25 [id number]   [provider]  Active   desonide (DESOWEN) 0.05 % cream [id number]  Apply topically daily as needed. Smitty Cords, DO  Active   empagliflozin (JARDIANCE) 25 MG TABS tablet [id number] Yes Take 1 tablet (25 mg total) by mouth daily. Smitty Cords, DO Taking Active   gabapentin (NEURONTIN) 300 MG capsule [id number]  Take 300 mg by mouth 3 (three) times daily. [provider]  Active            Med Note Francesco Runner, JAMIE A   Thu May 10, 2015  9:44 AM) Received from: [facility]  glucose blood [facility] ULTRA) test strip [id number]  USE TO CHECK BLOOD SUGAR THREE TIMES DAILY AS DIRECTED Smitty Cords, DO  Active   ibuprofen (ADVIL,MOTRIN) 800 MG tablet [id number]  Take 800 mg by mouth every 6 (six) hours as needed. [provider]  Active            Med Note Francesco Runner, JAMIE A   Thu May 10, 2015  9:44 AM) Received from: [facility]  Insulin Pen Needle (B-D UF III MINI PEN NEEDLES) 31G X 5 MM MISC [id number]  Use to inject insulin.  DX: E11.9 Smitty Cords, DO  Active   ipratropium (ATROVENT) 0.06 % nasal spray [id number]  USE 2 SPRAYS IN EACH NOSTRIL FOUR TIMES DAILY FOR UP  TO 5 TO 7 DAYS THEN STOP Karamalegos, [name], DO  Active   ketoconazole (NIZORAL) 2 % shampoo [id number]  Apply 1 application topically daily as needed. Smitty Cords, DO  Active   Lancets [facility] ULTRASOFT) lancets [id number]  Check blood sugar three times daily. Karamalegos, [name], DO  Active   lisinopril (ZESTRIL) 2.5 MG tablet [id number]  Take 1 tablet (2.5 mg total) by mouth daily. Smitty Cords, DO  Active   Multiple Vitamins-Minerals (PRESERVISION AREDS PO) [id number]  Take by mouth. [provider]  Active   Ocrelizumab (OCREVUS IV) [id number]  Inject into the vein. [provider]  Active   tirzepatide Greggory Keen) 5 MG/0.5ML Pen [id number] Yes Inject 5 mg into the skin once a week.  Smitty Cords, DO  Active   tiZANidine (ZANAFLEX) 4 MG tablet [id number]  TAKE 1/2 TO 1 TABLET(2 TO 4 MG) BY MOUTH EVERY 8 HOURS AS NEEDED FOR MUSCLE SPASMS Karamalegos, [name], DO  Active   traMADol (ULTRAM) 50 MG tablet [id number]  Take 50 mg by mouth daily as needed. [provider]  Active   TRESIBA FLEXTOUCH 100 UNIT/ML FlexTouch Pen [id number] Yes ADMINISTER 16 UNITS UNDER THE SKIN DAILY  Patient taking differently: Inject 18 Units into the skin at bedtime.   [name], NP Taking Active               Assessment/Plan:   Patient plans to schedule sooner follow up with PCP to see if might be appropriate to try a medication to help with his stress management   Diabetes: - Patient interested in making change from Ozempic to [facility] to see if can achieve greater blood sugar control with Mounjaro and allow for decreasing insulin dose in future            Completed PA request in January. PA approved through 05/20/2026  Collaborated with PCP Will have patient STOP Ozempic and START Mounjaro 5 mg weekly (starting 1 week after takes last dose of Ozempic - Have reviewed dietary modifications including having regular well-balanced  meals throughout the day and controlling carbohydrate portion sizes - Recommend to continue to use CGM as tool to reinforce/aid with dietary choices and improve blood sugar control Patient to continue to use glucometer to complete fingerstick reading when needed for symptoms/unsure of CGM data or if instructed by CGM device - Have reviewed symptoms of/how to manage low blood sugar. Encourage patient to continue to carry glucose tablets     Follow Up Plan: Clinic Pharmacist will follow up with patient by telephone on ***   Estelle Grumbles, PharmD, [place]y [place], CPP Clinical Pharmacist [facility] [phone]

## 2023-07-03 ENCOUNTER — Ambulatory Visit: Payer: 59 | Admitting: Family Medicine

## 2023-07-03 ENCOUNTER — Encounter: Payer: Self-pay | Admitting: Family Medicine

## 2023-07-03 VITALS — BP 126/78 | HR 96 | Ht 70.0 in | Wt 223.0 lb

## 2023-07-03 DIAGNOSIS — N401 Enlarged prostate with lower urinary tract symptoms: Secondary | ICD-10-CM

## 2023-07-03 DIAGNOSIS — F419 Anxiety disorder, unspecified: Secondary | ICD-10-CM

## 2023-07-03 DIAGNOSIS — E118 Type 2 diabetes mellitus with unspecified complications: Secondary | ICD-10-CM | POA: Diagnosis not present

## 2023-07-03 DIAGNOSIS — G35 Multiple sclerosis: Secondary | ICD-10-CM

## 2023-07-03 DIAGNOSIS — J00 Acute nasopharyngitis [common cold]: Secondary | ICD-10-CM | POA: Diagnosis not present

## 2023-07-03 DIAGNOSIS — R3915 Urgency of urination: Secondary | ICD-10-CM

## 2023-07-03 DIAGNOSIS — Z794 Long term (current) use of insulin: Secondary | ICD-10-CM

## 2023-07-03 MED ORDER — IPRATROPIUM BROMIDE 0.06 % NA SOLN: 2.0000 | Freq: Four times a day (QID) | NASAL | 0 refills | Status: DC

## 2023-07-03 MED ORDER — BUSPIRONE HCL 5 MG PO TABS: 5.0000 mg | ORAL_TABLET | Freq: Three times a day (TID) | ORAL | 3 refills | Status: DC | PRN

## 2023-07-03 NOTE — Patient Instructions (Addendum)
Thank you for coming to the office today.  Start Atrovent nasal spray decongestant 2 sprays in each nostril up to 4 times daily for 7 days   Keep on Mounjaro 5mg  weekly for now, we can adjust in future.  For Stress / Anxiety Start Buspar 5mg  up to 3 times per day as needed, can double a single dose if need, lasts 4-8 hours  Intermittent usage is fine  ------------------  Saw Palmetto OTC herbal 80 to 160mg  1-2 times per day for enlarge prostate symptoms  Next if needed, let me know - we can order Tramsulosin Flomax  Benign Prostatic Hyperplasia  Benign prostatic hyperplasia (BPH) is an enlarged prostate gland that is caused by the normal aging process. The prostate may get bigger as a man gets older. The condition is not caused by cancer. The prostate is a walnut-sized gland that is involved in the production of semen. It is located in front of the rectum and below the bladder. The bladder stores urine. The urethra carries stored urine out of the body. An enlarged prostate can press on the urethra. This can make it harder to pass urine. The buildup of urine in the bladder can cause infection. Back pressure and infection may progress to bladder damage and kidney (renal) failure. What are the causes? This condition is part of the normal aging process. However, not all men develop problems from this condition. If the prostate enlarges away from the urethra, urine flow will not be blocked. If it enlarges toward the urethra and compresses it, there will be problems passing urine. What increases the risk? This condition is more likely to develop in men older than 50 years. What are the signs or symptoms? Symptoms of this condition include: Getting up often during the night to urinate. Needing to urinate frequently during the day. Difficulty starting urine flow. Decrease in size and strength of your urine stream. Leaking (dribbling) after urinating. Inability to pass urine. This needs  immediate treatment. Inability to completely empty your bladder. Pain when you pass urine. This is more common if there is also an infection. Urinary tract infection (UTI). How is this diagnosed? This condition is diagnosed based on your medical history, a physical exam, and your symptoms. Tests will also be done, such as: A post-void bladder scan. This measures any amount of urine that may remain in your bladder after you finish urinating. A digital rectal exam. In a rectal exam, your health care provider checks your prostate by putting a lubricated, gloved finger into your rectum to feel the back of your prostate gland. This exam detects the size of your gland and any abnormal lumps or growths. An exam of your urine (urinalysis). A prostate specific antigen (PSA) screening. This is a blood test used to screen for prostate cancer. An ultrasound. This test uses sound waves to electronically produce a picture of your prostate gland. Your health care provider may refer you to a specialist in kidney and prostate diseases (urologist). How is this treated? Once symptoms begin, your health care provider will monitor your condition (active surveillance or watchful waiting). Treatment for this condition will depend on the severity of your condition. Treatment may include: Observation and yearly exams. This may be the only treatment needed if your condition and symptoms are mild. Medicines to relieve your symptoms, including: Medicines to shrink the prostate. Medicines to relax the muscle of the prostate. Surgery in severe cases. Surgery may include: Prostatectomy. In this procedure, the prostate tissue is removed  completely through an open incision or with a laparoscope or robotics. Transurethral resection of the prostate (TURP). In this procedure, a tool is inserted through the opening at the tip of the penis (urethra). It is used to cut away tissue of the inner core of the prostate. The pieces are  removed through the same opening of the penis. This removes the blockage. Transurethral incision (TUIP). In this procedure, small cuts are made in the prostate. This lessens the prostate's pressure on the urethra. Transurethral microwave thermotherapy (TUMT). This procedure uses microwaves to create heat. The heat destroys and removes a small amount of prostate tissue. Transurethral needle ablation (TUNA). This procedure uses radio frequencies to destroy and remove a small amount of prostate tissue. Interstitial laser coagulation (ILC). This procedure uses a laser to destroy and remove a small amount of prostate tissue. Transurethral electrovaporization (TUVP). This procedure uses electrodes to destroy and remove a small amount of prostate tissue. Prostatic urethral lift. This procedure inserts an implant to push the lobes of the prostate away from the urethra. Follow these instructions at home: Take over-the-counter and prescription medicines only as told by your health care provider. Monitor your symptoms for any changes. Contact your health care provider with any changes. Avoid drinking large amounts of liquid before going to bed or out in public. Avoid or reduce how much caffeine or alcohol you drink. Give yourself time when you urinate. Keep all follow-up visits. This is important. Contact a health care provider if: You have unexplained back pain. Your symptoms do not get better with treatment. You develop side effects from the medicine you are taking. Your urine becomes very dark or has a bad smell. Your lower abdomen becomes distended and you have trouble passing urine. Get help right away if: You have a fever or chills. You suddenly cannot urinate. You feel light-headed or very dizzy, or you faint. There are large amounts of blood or clots in your urine. Your urinary problems become hard to manage. You develop moderate to severe low back or flank pain. The flank is the side of your  body between the ribs and the hip. These symptoms may be an emergency. Get help right away. Call 911. Do not wait to see if the symptoms will go away. Do not drive yourself to the hospital. Summary Benign prostatic hyperplasia (BPH) is an enlarged prostate that is caused by the normal aging process. It is not caused by cancer. An enlarged prostate can press on the urethra. This can make it hard to pass urine. This condition is more likely to develop in men older than 50 years. Get help right away if you suddenly cannot urinate. This information is not intended to replace advice given to you by your health care provider. Make sure you discuss any questions you have with your health care provider. Document Revised: 11/21/2020 Document Reviewed: 11/21/2020 Elsevier Patient Education  2024 [employer]  Please schedule a Follow-up Appointment to: No follow-ups on file.  If you have any other questions or concerns, please feel free to call the office or send a message through MyChart. You may also schedule an earlier appointment if necessary.  Additionally, you may be receiving a survey about your experience at our office within a few days to 1 week by e-mail or mail. We value your feedback.  Saralyn Pilar, DO [facility], [place]

## 2023-07-03 NOTE — Progress Notes (Signed)
Subjective:    Patient ID: Miguel [name], male    DOB: 05/29/1970, 53 y.o.   MRN: [id number]  Miguel [name] is a 53 y.o. male presenting on 07/03/2023 for Diabetes   HPI  Discussed the use of AI scribe software for clinical note transcription with the patient, who gave verbal consent to proceed.  History of Present Illness   Miguel [name] is a 54 year old male with diabetes and multiple sclerosis who presents with stress-related hyperglycemia and medication side effects.     Anxiety / Stressors Stress raises sugar Dexcom G7 tells him raise in sugar during work On vacation no stress See below on med management  Dalfampridine 10mg  TB12 reduced from 2 to 1  BPH  New concern. Weaker stream with urination, frequency at times. Nocturia Never on med for this, has been on Tamsulosin for kidney stone in the past  FOLLOW-UP CHRONIC DM, Type 2: See prior notes for background information. A1c due today Dexcom G7 CGM reviewed data,  Improved control Stress seems to be main factor to raise sugar as well as steroid injections On Mounjaro 5mg  (switched from Ozempic 2mg ) newly Continues Jardiance 25mg  daily, Insulin Tresiba down to 18 units - Remains OFF Metformin Currently on ACEi Lifestyle: Improving diet exercise Denies hypoglycemia (rare 70-80 reading but had no symptoms)    Rhinitis Needs new rx Atrovent nasal PRN   Chronic Neuropathic Pain / Back Pain / Multiple Sclerosis Followed by [facility]. See outside record for details. Continues on disease modifying therapy infusion every few months, rarely has flare up requiring bag steroid infusion. - Taking Gabapentin, Tizanidine, Tramadol - temporary relief - Has been receiving spinal injections as well - Significant improvement still on Tizanidine 2-4mg  dosage PRN with great results to help pain. Takes it early to prevent. Has rarely taken Baclofen PRN for spasms.  On Dalfampridine He has multiple sclerosis  and takes medication for muscle spasms and stiffness. He reduced the dosage due to a sensitive tongue and experiences urinary symptoms like difficulty starting urination and a weak stream, which he attributes to medication side effects or age-related prostate changes.    OSA, on CPAP - Patient reports prior history of dx OSA and on CPAP - Today reports that sleep apnea is well controlled.  uses the CPAP machine every night. Tolerates the machine well, and thinks that sleeps better with it and feels good. No new concerns or symptoms.        07/03/2023    3:03 PM 04/08/2023    8:18 AM 09/30/2022    8:15 AM  Depression screen PHQ 2/9  Decreased Interest 0 0 0  Down, Depressed, Hopeless 0 0 0  PHQ - 2 Score 0 0 0  Altered sleeping 3 0   Tired, decreased energy 3 3   Change in appetite 0 0   Feeling bad or failure about yourself  0 0   Trouble concentrating 0 0   Moving slowly or fidgety/restless 0 0   Suicidal thoughts 0 0   PHQ-9 Score 6 3   Difficult doing work/chores Somewhat difficult         07/03/2023    3:03 PM 04/08/2023    8:18 AM 09/30/2022    8:15 AM 07/01/2022    8:56 AM  GAD 7 : Generalized Anxiety Score  Nervous, Anxious, on Edge 0 0 0 0  Control/stop worrying 0 0 0 0  Worry too much - different things 0  0 0 0  Trouble relaxing 0 0 0 0  Restless 0 0 0 0  Easily annoyed or irritable 0 0 0 0  Afraid - awful might happen 0 0 0 0  Total GAD 7 Score 0 0 0 0  Anxiety Difficulty    Not difficult at all    Social History   Tobacco Use   Smoking status: Never   Smokeless tobacco: Never  Substance Use Topics   Alcohol use: No   Drug use: No    Review of Systems Per HPI unless specifically indicated above     Objective:    BP 126/78   Pulse 96   Ht 5\' 10"  (1.778 m)   Wt 223 lb (101.2 kg)   SpO2 98%   BMI 32.00 kg/m   Wt Readings from Last 3 Encounters:  07/03/23 223 lb (101.2 kg)  04/08/23 215 lb (97.5 kg)  02/26/23 219 lb (99.3 kg)    Physical  Exam Vitals and nursing note reviewed.  Constitutional:      General: He is not in acute distress.    Appearance: He is well-developed. He is not diaphoretic.     Comments: Well-appearing, comfortable, cooperative  HENT:     Head: Normocephalic and atraumatic.  Eyes:     General:        Right eye: No discharge.        Left eye: No discharge.     Conjunctiva/sclera: Conjunctivae normal.  Neck:     Thyroid: No thyromegaly.  Cardiovascular:     Rate and Rhythm: Normal rate and regular rhythm.     Pulses: Normal pulses.     Heart sounds: Normal heart sounds. No murmur heard. Pulmonary:     Effort: Pulmonary effort is normal. No respiratory distress.     Breath sounds: Normal breath sounds. No wheezing or rales.  Musculoskeletal:        General: Normal range of motion.     Cervical back: Normal range of motion and neck supple.  Lymphadenopathy:     Cervical: No cervical adenopathy.  Skin:    General: Skin is warm and dry.     Findings: No erythema or rash.  Neurological:     Mental Status: He is alert and oriented to person, place, and time. Mental status is at baseline.  Psychiatric:        Behavior: Behavior normal.     Comments: Well groomed, good eye contact, normal speech and thoughts     Results for orders placed or performed in visit on 04/08/23  Hemoglobin A1c   Collection Time: 04/08/23  8:43 AM  Result Value Ref Range   Hgb A1c MFr Bld 7.3 (H) <5.7 % of total Hgb   Mean Plasma Glucose 163 mg/dL   eAG (mmol/L) 9.0 mmol/L  Lipid panel   Collection Time: 04/08/23  8:43 AM  Result Value Ref Range   Cholesterol 139 <200 mg/dL   HDL 37 (L) > OR = 40 mg/dL   Triglycerides 83 <161 mg/dL   LDL Cholesterol (Calc) 85 mg/dL (calc)   Total CHOL/HDL Ratio 3.8 <5.0 (calc)   Non-HDL Cholesterol (Calc) 102 <130 mg/dL (calc)  COMPLETE METABOLIC PANEL WITH GFR   Collection Time: 04/08/23  8:43 AM  Result Value Ref Range   Glucose, Bld 127 (H) 65 - 99 mg/dL   BUN 13 7 - 25  mg/dL   Creat 0.96 0.45 - 4.09 mg/dL   eGFR 811 > OR = 60 BJ/YNW/2.95A2  BUN/Creatinine Ratio SEE NOTE: 6 - 22 (calc)   Sodium 140 135 - 146 mmol/L   Potassium 4.5 3.5 - 5.3 mmol/L   Chloride 104 98 - 110 mmol/L   CO2 28 20 - 32 mmol/L   Calcium 9.7 8.6 - 10.3 mg/dL   Total Protein 6.8 6.1 - 8.1 g/dL   Albumin 4.7 3.6 - 5.1 g/dL   Globulin 2.1 1.9 - 3.7 g/dL (calc)   AG Ratio 2.2 1.0 - 2.5 (calc)   Total Bilirubin 1.3 (H) 0.2 - 1.2 mg/dL   Alkaline phosphatase (APISO) 89 35 - 144 U/L   AST 18 10 - 35 U/L   ALT 23 9 - 46 U/L  CBC with Differential/Platelet   Collection Time: 04/08/23  8:43 AM  Result Value Ref Range   WBC 5.1 3.8 - 10.8 Thousand/uL   RBC 5.40 4.20 - 5.80 Million/uL   Hemoglobin 17.1 13.2 - 17.1 g/dL   HCT 82.9 (H) 56.2 - 13.0 %   MCV 93.0 80.0 - 100.0 fL   MCH 31.7 27.0 - 33.0 pg   MCHC 34.1 32.0 - 36.0 g/dL   RDW 86.5 78.4 - 69.6 %   Platelets 225 140 - 400 Thousand/uL   MPV 10.5 7.5 - 12.5 fL   Neutro Abs 3,239 1,500 - 7,800 cells/uL   Absolute Lymphocytes 1,224 850 - 3,900 cells/uL   Absolute Monocytes 525 200 - 950 cells/uL   Eosinophils Absolute 82 15 - 500 cells/uL   Basophils Absolute 31 0 - 200 cells/uL   Neutrophils Relative % 63.5 %   Total Lymphocyte 24.0 %   Monocytes Relative 10.3 %   Eosinophils Relative 1.6 %   Basophils Relative 0.6 %  PSA   Collection Time: 04/08/23  8:43 AM  Result Value Ref Range   PSA 0.42 < OR = 4.00 ng/mL  Microalbumin / creatinine urine ratio   Collection Time: 04/08/23  8:43 AM  Result Value Ref Range   Creatinine, Urine 96 20 - 320 mg/dL   Microalb, Ur 0.8 mg/dL   Microalb Creat Ratio 8 <30 mg/g creat  TSH   Collection Time: 04/08/23  8:43 AM  Result Value Ref Range   TSH 0.89 0.40 - 4.50 mIU/L      Assessment & Plan:   Problem List Items Addressed This Visit     Anxiety   Relevant Medications   busPIRone (BUSPAR) 5 MG tablet   Multiple sclerosis (HCC)   Other Visit Diagnoses       Controlled  type 2 diabetes mellitus with complication, with long-term current use of insulin (HCC)    -  Primary     Acute rhinitis       Relevant Medications   ipratropium (ATROVENT) 0.06 % nasal spray     Benign prostatic hyperplasia (BPH) with urinary urgency            Diabetes Mellitus Type 2 CGM has improved his control overall On DexCOM G7  Steroid courses PRN for MS No evidence of hypoglycemia on GLP and Insulin Failed Metformin 1000mg  BID (GI intolerance), XR was ineffective OFF Ozempic 2mg  Complications - other including dyslipidemia with low HDL and history of hypertriglyceridemia, obesity, OSA - increases risk of future cardiovascular complications / poor glucose control due to OSA  Suspected stress/anxiety is raising his glucose levels. Continue Mounjaro 5mg  weekly, future dose increase as tolerated -Continue Jardiance 25mg  daily -On Tresiba 18u daily, goal to wean down on insulin as inc Mounjaro Followed by Clinical  Pharmacy  Anxiety / Stress -Start Buspar 5mg  up to three times per day as needed for stress management.  BPH w/ Urgency Urinary Symptoms Reports difficulty initiating urination and variable stream strength, possibly due to prostate enlargement or medication side effects. -Trial of Saw Palmetto, an over-the-counter herbal supplement, for potential prostate-related symptoms.  Upper Respiratory Symptoms Persistent nasal drainage despite cold resolution and ongoing Zyrtec use. -Start Atrovent nasal spray for symptom management.  Multiple Sclerosis Followed by Neurology Ongoing management with Dalfampridine -Continue current regimen as prescribed.      No orders of the defined types were placed in this encounter.   Meds ordered this encounter  Medications   busPIRone (BUSPAR) 5 MG tablet    Sig: Take 1 tablet (5 mg total) by mouth 3 (three) times daily as needed (anxiety).    Dispense:  90 tablet    Refill:  3   ipratropium (ATROVENT) 0.06 % nasal spray     Sig: Place 2 sprays into both nostrils 4 (four) times daily. For up to 5-7 days then stop.    Dispense:  15 mL    Refill:  0    Follow up plan: Return if symptoms worsen or fail to improve.   Saralyn Pilar, DO [facility] 07/03/2023, 3:33 PM

## 2023-07-17 ENCOUNTER — Other Ambulatory Visit: Payer: Self-pay

## 2023-07-17 DIAGNOSIS — E1169 Type 2 diabetes mellitus with other specified complication: Secondary | ICD-10-CM

## 2023-07-17 MED ORDER — DEXCOM G7 SENSOR MISC: 12 refills | Status: AC

## 2023-07-20 ENCOUNTER — Telehealth: Payer: Self-pay | Admitting: Pharmacist

## 2023-07-20 ENCOUNTER — Telehealth: Payer: Self-pay

## 2023-07-20 ENCOUNTER — Other Ambulatory Visit: Payer: Self-pay | Admitting: Pharmacist

## 2023-07-20 DIAGNOSIS — Z794 Long term (current) use of insulin: Secondary | ICD-10-CM

## 2023-07-20 DIAGNOSIS — E118 Type 2 diabetes mellitus with unspecified complications: Secondary | ICD-10-CM

## 2023-07-20 MED ORDER — MOUNJARO 7.5 MG/0.5ML ~~LOC~~ SOAJ: 7.5000 mg | SUBCUTANEOUS | 0 refills | Status: DC

## 2023-07-20 NOTE — Patient Instructions (Signed)
Goals Addressed             This Visit's Progress    Pharmacy Goals       Our goal A1c is less than 7%. This corresponds with fasting sugars less than 130 and 2 hour after meal sugars less than 180. Please check your blood sugar and keep a log of the results  Our goal bad cholesterol, or LDL, is less than 70 . This is why it is important to continue taking your atorvastatin.  Feel free to call me with any questions or concerns. I look forward to our next call!  Elisabeth Delles, PharmD, BCACP, CPP Clinical Pharmacist [facility] [phone]        

## 2023-07-20 NOTE — Progress Notes (Signed)
 07/20/2023 Name: Miguel [name] MRN: [id number] DOB: [birth year]  Chief Complaint  Patient presents with   Medication Management    Miguel [name] is a 53 y.o. year old male who presented for a telephone visit.   They were referred to the pharmacist by their PCP for assistance in managing diabetes and medication access.      Subjective:   Care Team: Primary Care Provider: Smitty Cords, DO ; Next Scheduled Visit: 10/06/2023 Pain Management: Dr. Pecolia [facility]; Next Scheduled Visit: 07/24/2023 Neurology: [facility] Urology: Vanna Scotland, MD [facility]  Medication Access/Adherence  Current Pharmacy:  [facility] - [place], [place] - [address] [place] [address] Phone: [phone] Fax: [phone]  [facility][id number] [place], [place] - [address] AT [facility] OF [address] [place] [address] [place] [address] Phone: [phone] Fax: [phone]  [facility] - [place], [place] - [address] [place] [address] Phone: [phone] Fax: [phone]   Patient reports affordability concerns with their medications: No  Patient reports access/transportation concerns to their pharmacy: No  Patient reports adherence concerns with their medications:  No    Reports is switching his medications to [facility]. Update preferred pharmacy in chart.  Reports recently has had cough and congestion since last Monday, but symptoms improving today - States will contact office if needed for visit with PCP if symptoms do not continue to improve  Type 2 Diabetes:   Current medications:  Mounjaro 5 mg weekly on Sundays (started 2/9) Reports tolerating well Tresiba FlexTouch 10 units daily in evening (self-decreased based on blood sugar readings since switched from Ozempic to [employer]) Jardiance 25 mg daily with breakfast    Medications tried  in the past: metformin (IR GI intolerance/felt ER at tolerated dose was ineffective)    Note blood sugar control impacted by receiving steroid infusions and injections: Receives steroid injections ~every 3 months for pain management and steroid infusions every 6 months with multiple sclerosis treatment (Ocrelizumab infusion)   Patient uses Dexcom G7 Continuous Glucose Monitoring (CGM),    Date of Download: 07/20/23 % Time CGM is active: 88.5% Average Glucose: 198 mg/dL Glucose Management Indicator: 8.0%  Glucose Variability: 22.4% (goal <36%) Time in Goal:  - Time in range 70-180: 38% - Time above range: 62% - Time below range: 0%   Attributes higher readings over the past week to stress of being sick/taking over the counter cough syrup (containing sugar)  Patient requests to increase Mounjaro dose to 7.5 mg weekly. Prefers to reduce his [name] dose to 5 units daily when makes this dose increase to reduce risk of hypoglycemia  Uses CGM as tool to reinforce/aid with dietary choices and improve blood sugar control   Current physical activity: Walking throughout the day at work/at home walking ~30 minutes x most days/week. Going to PT twice weekly   Reports continues to work on weight loss, current home weight ~214 lbs   Statin therapy: atorvastatin 20 mg daily   Patient denies hypoglycemic s/sx including dizziness, shakiness, sweating.  Confirms carries glucose tablets in case of symptoms of hypoglycemia    Objective:  Lab Results  Component Value Date   HGBA1C 7.3 (H) 04/08/2023    Lab Results  Component Value Date   CREATININE 0.79 04/08/2023   BUN 13 04/08/2023   NA 140 04/08/2023  K 4.5 04/08/2023   CL 104 04/08/2023   CO2 28 04/08/2023    Lab Results  Component Value Date   CHOL 139 04/08/2023   HDL 37 (L) 04/08/2023   LDLCALC 85 04/08/2023   TRIG 83 04/08/2023   CHOLHDL 3.8 04/08/2023    Medications Reviewed Today     Reviewed by Manuela Neptune,  RPH-CPP (Pharmacist) on 07/20/23 at 1152  Med List Status: <None>   Medication Order Taking? Sig Documenting Provider Last Dose Status Informant  amphetamine-dextroamphetamine (ADDERALL) 20 MG tablet [id number]  Take 20 mg by mouth daily. [provider]  Active   atorvastatin (LIPITOR) 20 MG tablet [id number]  Take 1 tablet (20 mg total) by mouth daily. Karamalegos, [name], DO  Active   baclofen (LIORESAL) 10 MG tablet [id number]  Take 1 tablet by mouth 3 (three) times daily as needed. [provider]  Active   busPIRone (BUSPAR) 5 MG tablet [id number]  Take 1 tablet (5 mg total) by mouth 3 (three) times daily as needed (anxiety). Karamalegos, [name], DO  Active   cetirizine (ZYRTEC) 10 MG tablet [id number]  Take 10 mg by mouth daily as needed for allergies. [provider]  Active   Cholecalciferol (D 1000) 1000 units capsule [id number]  Take by mouth.  [provider]  Active            Med Note Francesco Runner, JAMIE A   Thu May 10, 2015  9:44 AM) Received from: [facility]  Continuous Glucose Sensor [facility] G7 SENSOR) [place] [id number]  Use as directed. Apply 1 sensor every 10 days. Smitty Cords, DO  Active   Cyanocobalamin (VITAMIN B-12) 5000 MCG SUBL [id number]  Place under the tongue. [provider]  Active            Med Note Francesco Runner, JAMIE A   Thu May 10, 2015  9:44 AM) Received from: [facility]  dalfampridine 10 [place] VF64 [id number]   [provider]  Active   desonide (DESOWEN) 0.05 % cream [id number]  Apply topically daily as needed. Smitty Cords, DO  Active   empagliflozin (JARDIANCE) 25 MG TABS tablet [id number] Yes Take 1 tablet (25 mg total) by mouth daily. Smitty Cords, DO Taking Active   gabapentin (NEURONTIN) 300 MG capsule [id number]  Take 300 mg by mouth 3 (three) times daily. [provider]  Active            Med Note Francesco Runner, JAMIE A   Thu May 10, 2015  9:44 AM) Received from:  [facility]  glucose blood [facility] ULTRA) test strip [id number]  USE TO CHECK BLOOD SUGAR THREE TIMES DAILY AS DIRECTED Smitty Cords, DO  Active   ibuprofen (ADVIL,MOTRIN) 800 MG tablet [id number]  Take 800 mg by mouth every 6 (six) hours as needed. [provider]  Active            Med Note Francesco Runner, JAMIE A   Thu May 10, 2015  9:44 AM) Received from: [facility]  Insulin Pen Needle (B-D UF III MINI PEN NEEDLES) 31G X 5 MM MISC [id number]  Use to inject insulin.  DX: E11.9 Smitty Cords, DO  Active   ipratropium (ATROVENT) 0.06 % nasal spray [id number]  Place 2 sprays into both nostrils 4 (four) times daily. For up to 5-7 days then stop. Karamalegos, [name], DO  Active   ketoconazole (NIZORAL) 2 % shampoo [id number]  Apply 1 application topically  daily as needed. Smitty Cords, DO  Active   Lancets [facility] ULTRASOFT) lancets [id number]  Check blood sugar three times daily. Karamalegos, [name], DO  Active   lisinopril (ZESTRIL) 2.5 MG tablet [id number]  Take 1 tablet (2.5 mg total) by mouth daily. Smitty Cords, DO  Active   Multiple Vitamins-Minerals (PRESERVISION AREDS PO) [id number]  Take by mouth. [provider]  Active   Ocrelizumab (OCREVUS IV) [id number]  Inject into the vein. [provider]  Active   tirzepatide [facility]) 7.5 MG/0.5ML Pen [id number] Yes Inject 7.5 mg into the skin once a week. Smitty Cords, DO  Active   tiZANidine (ZANAFLEX) 4 MG tablet [id number]  TAKE 1/2 TO 1 TABLET(2 TO 4 MG) BY MOUTH EVERY 8 HOURS AS NEEDED FOR MUSCLE SPASMS Karamalegos, [name], DO  Active   traMADol (ULTRAM) 50 MG tablet [id number]  Take 1 tablet by mouth daily as needed. [provider]  Active   TRESIBA FLEXTOUCH 100 UNIT/ML FlexTouch Pen [id number] Yes ADMINISTER 16 UNITS UNDER THE SKIN DAILY  Patient taking differently: Inject 10 Units into the skin at bedtime.   [name], NP Taking  Active               Assessment/Plan:   Diabetes: - Patient requests to increase Mounjaro dose to 7.5 mg weekly (starting on 3/9) and reduce his Tresiba dose to 5 units daily. Note patient would like to come off of insulin if able in future as increasing Mounjaro  CPP sends prescription for Mounjaro 7.5 mg weekly to [facility] for patient - Have reviewed dietary modifications including having regular well-balanced meals throughout the day and controlling carbohydrate portion sizes - Recommend to continue to use CGM as tool to reinforce/aid with dietary choices and improve blood sugar control Patient to continue to use glucometer to complete fingerstick reading when needed for symptoms/unsure of CGM data or if instructed by CGM device - Have reviewed symptoms of/how to manage low blood sugar. Encourage patient to continue to carry glucose tablets and contact office as needed for any symptoms of hypoglycemia     Follow Up Plan: Clinic Pharmacist will follow up with patient by telephone on 08/17/2023 at 9:00 AM    Estelle Grumbles, PharmD, Patsy [place], CPP Clinical Pharmacist [facility] [phone]

## 2023-07-20 NOTE — Telephone Encounter (Signed)
   Outreach Note  07/20/2023 Name: Miguel [name] MRN: [id number] DOB: [birth year]  Referred by: Smitty Cords, DO Reason for referral : Medication Management   Was unable to reach patient via telephone today and have left HIPAA compliant voicemail asking patient to return my call.   Follow Up Plan: Will collaborate with Care Guide to outreach to schedule follow up with me  Estelle Grumbles, PharmD, Patsy [place], CPP Clinical Pharmacist [name] [phone]

## 2023-07-20 NOTE — Telephone Encounter (Signed)
 [facility] ([facility]) Rx #: [id number] Need Help? Call us at [phone] Outcome Additional Information Required Your PA request cannot be processed for the member plan submitted. For further inquiries please contact the number on the back of the member prescription card. (Message [phone]) Drug Mounjaro 7.5MG /0.5ML auto-injectors ePA cloud Optician, dispensing PA Form [phone] NCPDP) Original Claim Info 45

## 2023-07-21 ENCOUNTER — Other Ambulatory Visit: Payer: Self-pay | Admitting: Family Medicine

## 2023-07-21 DIAGNOSIS — J0110 Acute frontal sinusitis, unspecified: Secondary | ICD-10-CM

## 2023-07-21 MED ORDER — AMOXICILLIN-POT CLAVULANATE 875-125 MG PO TABS
1.0000 | ORAL_TABLET | Freq: Two times a day (BID) | ORAL | 0 refills | Status: DC
Start: 2023-07-21 — End: 2023-08-17

## 2023-07-21 MED ORDER — PREDNISONE 10 MG PO TABS: ORAL_TABLET | ORAL | 0 refills | Status: DC

## 2023-08-17 ENCOUNTER — Other Ambulatory Visit (INDEPENDENT_AMBULATORY_CARE_PROVIDER_SITE_OTHER): Admitting: Pharmacist

## 2023-08-17 DIAGNOSIS — E118 Type 2 diabetes mellitus with unspecified complications: Secondary | ICD-10-CM

## 2023-08-17 DIAGNOSIS — Z794 Long term (current) use of insulin: Secondary | ICD-10-CM

## 2023-08-17 MED ORDER — MOUNJARO 7.5 MG/0.5ML ~~LOC~~ SOAJ: 7.5000 mg | SUBCUTANEOUS | 1 refills | Status: DC

## 2023-08-17 NOTE — Patient Instructions (Signed)
Goals Addressed             This Visit's Progress    Pharmacy Goals       Our goal A1c is less than 7%. This corresponds with fasting sugars less than 130 and 2 hour after meal sugars less than 180. Please check your blood sugar and keep a log of the results  Our goal bad cholesterol, or LDL, is less than 70 . This is why it is important to continue taking your atorvastatin.  Feel free to call me with any questions or concerns. I look forward to our next call!  Elisabeth Delles, PharmD, BCACP, CPP Clinical Pharmacist [facility] [phone]        

## 2023-08-17 NOTE — Progress Notes (Signed)
 08/17/2023 Name: Miguel [name] MRN: [id number] DOB: 1970/10/19  Chief Complaint  Patient presents with   Medication Management    Miguel [name] is a 53 y.o. year old male who presented for a telephone visit.   They were referred to the pharmacist by their PCP for assistance in managing diabetes and medication access.      Subjective:   Care Team: Primary Care Provider: Smitty Cords, DO ; Next Scheduled Visit: 10/06/2023 Pain Management: Dr. Pecolia [facility]; Next Scheduled Visit: 10/16/2023 Neurology: [facility] Urology: Vanna Scotland, MD [facility]  Medication Access/Adherence  Current Pharmacy:  [facility] - [place], [place] - [address] [place] [address] Phone: [phone] Fax: [phone]  [facility] #[id number] [place], [place] - [address] AT [facility] OF [address] [place] [address] [place] [address] Phone: [phone] Fax: [phone]  [facility] - [place], [place] - [address] [place] [address] Phone: [phone] Fax: [phone]   Patient reports affordability concerns with their medications: No  Patient reports access/transportation concerns to their pharmacy: No  Patient reports adherence concerns with their medications:  No     Type 2 Diabetes:   Current medications:  Mounjaro 7.5 mg weekly on Sundays (increased to current dose on 3/9) Reports tolerating well Tresiba FlexTouch 5 units daily in evening  Jardiance 25 mg daily with breakfast    Medications tried in the past: metformin (IR GI intolerance/felt ER at tolerated dose was ineffective)    Note blood sugar control impacted by receiving steroid infusions and injections: Receives steroid injections ~every 3 months for pain management and steroid infusions every 6 months with multiple sclerosis treatment (Ocrelizumab infusion)   Patient uses  Dexcom G7 Continuous Glucose Monitoring (CGM)   Date of Download: 08/17/23 % Time CGM is active: 99% Average Glucose: 172 mg/dL Glucose Management Indicator: 7.4%  Glucose Variability: 18.3% (goal <36%) Time in Goal:  - Time in range 70-180: 67% - Time above range: 33% - Time below range: 0%   Reports continuing to have well balanced meals spread throughout the day while controlling portion sizes of carbohydrates   Uses CGM as tool to reinforce/aid with dietary choices and improve blood sugar control   Current physical activity: Walking throughout the day at work/at home walking ~30 minutes x most days/week. Going to PT twice weekly   Reports continues to work on weight loss, current home weight ~209 lbs   Statin therapy: atorvastatin 20 mg daily   Patient denies hypoglycemic s/sx including dizziness, shakiness, sweating.  Confirms carries glucose tablets in case of symptoms of hypoglycemia   Objective:  Lab Results  Component Value Date   HGBA1C 7.3 (H) 04/08/2023    Lab Results  Component Value Date   CREATININE 0.79 04/08/2023   BUN 13 04/08/2023   NA 140 04/08/2023   K 4.5 04/08/2023   CL 104 04/08/2023   CO2 28 04/08/2023    Lab Results  Component Value Date   CHOL 139 04/08/2023   HDL 37 (L) 04/08/2023   LDLCALC 85 04/08/2023   TRIG 83 04/08/2023   CHOLHDL 3.8 04/08/2023    Medications Reviewed Today     Reviewed by Manuela Neptune, RPH-CPP (Pharmacist) on 08/17/23 at [phone]  Med List Status: <None>   Medication Order Taking? Sig Documenting Provider Last Dose Status Informant  amphetamine-dextroamphetamine (ADDERALL) 20 MG tablet [id number]  Take 20 mg by mouth daily. [provider]  Active   atorvastatin (LIPITOR) 20 MG tablet [id number]  Take 1 tablet (20 mg total) by mouth daily. Karamalegos, [name], DO  Active   baclofen (LIORESAL) 10 MG tablet [id number]  Take 1 tablet by mouth 3 (three) times daily as needed. [provider]  Active   busPIRone (BUSPAR) 5 MG tablet [id number]  Take 1 tablet (5 mg total) by mouth 3 (three) times daily as needed (anxiety). Karamalegos, [name], DO  Active   cetirizine (ZYRTEC) 10 MG tablet [id number]  Take 10 mg by mouth daily as needed for allergies. [provider]  Active   Cholecalciferol (D 1000) 1000 units capsule [id number]  Take by mouth.  [provider]  Active            Med Note Francesco Runner, JAMIE A   Thu May 10, 2015  9:44 AM) Received from: [facility]  Continuous Glucose Sensor [facility] G7 SENSOR) [place] [id number]  Use as directed. Apply 1 sensor every 10 days. Smitty Cords, DO  Active   Cyanocobalamin (VITAMIN B-12) 5000 MCG SUBL [id number]  Place under the tongue. [provider]  Active            Med Note Francesco Runner, JAMIE A   Thu May 10, 2015  9:44 AM) Received from: [facility]  dalfampridine 10 [place] JO84 [id number]   [provider]  Active   desonide (DESOWEN) 0.05 % cream [id number]  Apply topically daily as needed. Smitty Cords, DO  Active   empagliflozin (JARDIANCE) 25 MG TABS tablet [id number] Yes Take 1 tablet (25 mg total) by mouth daily. Smitty Cords, DO Taking Active   gabapentin (NEURONTIN) 300 MG capsule [id number]  Take 300 mg by mouth 3 (three) times daily. [provider]  Active            Med Note Francesco Runner, JAMIE A   Thu May 10, 2015  9:44 AM) Received from: [facility]  glucose blood [facility] ULTRA) test strip [id number]  USE TO CHECK BLOOD SUGAR THREE TIMES DAILY AS DIRECTED Smitty Cords, DO  Active   ibuprofen (ADVIL,MOTRIN) 800 MG tablet [id number]  Take 800 mg by mouth every 6 (six) hours as needed. [provider]  Active            Med Note Francesco Runner, JAMIE A   Thu May 10, 2015  9:44 AM) Received from: [facility]  Insulin Pen Needle (B-D UF III MINI PEN NEEDLES) 31G X 5 MM MISC [id number]  Use to inject insulin.  DX: E11.9 Smitty Cords, DO  Active   ipratropium (ATROVENT) 0.06 % nasal spray [id number]  Place 2 sprays into both nostrils 4 (four) times daily. For up to 5-7 days then stop. Karamalegos, [name], DO  Active   ketoconazole (NIZORAL) 2 % shampoo [id number]  Apply 1 application topically daily as needed. Smitty Cords, DO  Active   Lancets [facility] ULTRASOFT) lancets [id number]  Check blood sugar three times daily. Karamalegos, [name], DO  Active   lisinopril (ZESTRIL) 2.5 MG tablet [id number]  Take 1 tablet (2.5 mg total) by mouth daily. Smitty Cords, DO  Active   Multiple Vitamins-Minerals (PRESERVISION AREDS PO) [id number]  Take by mouth. [provider]  Active   Ocrelizumab (OCREVUS IV) [id number]  Inject into the vein. [provider]  Active  tirzepatide [facility]) 7.5 MG/0.5ML Pen [id number] Yes Inject 7.5 mg into the skin once a week. Smitty Cords, DO Taking Active   tiZANidine (ZANAFLEX) 4 MG tablet [id number]  TAKE 1/2 TO 1 TABLET(2 TO 4 MG) BY MOUTH EVERY 8 HOURS AS NEEDED FOR MUSCLE SPASMS Karamalegos, [name], DO  Active   traMADol (ULTRAM) 50 MG tablet [id number]  Take 1 tablet by mouth daily as needed. [provider]  Active   TRESIBA FLEXTOUCH 100 UNIT/ML FlexTouch Pen [id number] Yes ADMINISTER 16 UNITS UNDER THE SKIN DAILY  Patient taking differently: Inject 5 Units into the skin at bedtime.   [name], NP Taking Active               Assessment/Plan:   Diabetes: - Recommend patient continue Mounjaro 7.5 mg weekly, Tresiba FlexTouch 5 units daily and  Jardiance 25 mg daily with breakfast   Send renewal of Mounjaro prescription to pharmacy for patient - Counsel on dietary modifications including having regular well-balanced meals throughout the day and controlling carbohydrate portion sizes - Recommend to continue to use CGM as tool to reinforce/aid with dietary choices and improve blood sugar  control Patient to continue to use glucometer to complete fingerstick reading when needed for symptoms/unsure of CGM data or if instructed by CGM device - Have reviewed symptoms of/how to manage low blood sugar. Encourage patient to continue to carry glucose tablets and contact office as needed for any symptoms of hypoglycemia     Follow Up Plan: Clinic Pharmacist will follow up with patient by telephone on 09/14/2023 at 8:30 AM     Estelle Grumbles, PharmD, Patsy [place], CPP Clinical Pharmacist [facility] [phone]

## 2023-08-24 ENCOUNTER — Other Ambulatory Visit: Payer: Self-pay | Admitting: Pharmacist

## 2023-08-24 ENCOUNTER — Ambulatory Visit: Admitting: Family Medicine

## 2023-08-24 ENCOUNTER — Ambulatory Visit: Payer: Self-pay

## 2023-08-24 ENCOUNTER — Ambulatory Visit
Admission: RE | Admit: 2023-08-24 | Discharge: 2023-08-24 | Disposition: A | Discharge status: Home / Self Care | Source: Ambulatory Visit | Attending: Family Medicine | Admitting: Family Medicine

## 2023-08-24 ENCOUNTER — Encounter: Payer: Self-pay | Admitting: Family Medicine

## 2023-08-24 ENCOUNTER — Ambulatory Visit
Admission: RE | Admit: 2023-08-24 | Discharge: 2023-08-24 | Disposition: A | Discharge status: Home / Self Care | Attending: Family Medicine | Admitting: Family Medicine

## 2023-08-24 VITALS — BP 110/68 | HR 63 | Ht 70.0 in | Wt 212.0 lb

## 2023-08-24 DIAGNOSIS — R0981 Nasal congestion: Secondary | ICD-10-CM

## 2023-08-24 DIAGNOSIS — Z794 Long term (current) use of insulin: Secondary | ICD-10-CM

## 2023-08-24 DIAGNOSIS — K625 Hemorrhage of anus and rectum: Secondary | ICD-10-CM | POA: Diagnosis present

## 2023-08-24 DIAGNOSIS — K5900 Constipation, unspecified: Secondary | ICD-10-CM

## 2023-08-24 MED ORDER — IPRATROPIUM BROMIDE 0.06 % NA SOLN
2.0000 | Freq: Four times a day (QID) | NASAL | 3 refills | Status: DC | PRN
Start: 2023-08-24 — End: 2023-10-06

## 2023-08-24 NOTE — Patient Instructions (Addendum)
 Thank you for coming to the office today.  KUB X-ray today  If the bleeding resolves, we can refer to Gastroenterology   For Constipation (less frequent bowel movement that can be hard dry or involve straining).  Recommend trying OTC Miralax 17g = 1 capful in large glass water once daily for now, try several days to see if working, goal is soft stool or BM 1-2 times daily, if too loose then reduce dose or try every other day. If not effective may need to increase it to 2 doses at once in AM or may do 1 in morning and 1 in afternoon/evening  - This medicine is very safe and can be used often without any problem and will not make you dehydrated. It is good for use on AS NEEDED BASIS or even MAINTENANCE therapy for longer term for several days to weeks at a time to help regulate bowel movements  Other more natural remedies or preventative treatment: - Increase hydration with water - Increase fiber in diet (high fiber foods = vegetables, leafy greens, oats/grains) - May take OTC Fiber supplement (metamucil powder or pill/gummy) - May try OTC Probiotic   Please schedule a Follow-up Appointment to: Return if symptoms worsen or fail to improve.  If you have any other questions or concerns, please feel free to call the office or send a message through MyChart. You may also schedule an earlier appointment if necessary.  Additionally, you may be receiving a survey about your experience at our office within a few days to 1 week by e-mail or mail. We value your feedback.  Saralyn Pilar, DO [facility], [place]

## 2023-08-24 NOTE — Telephone Encounter (Signed)
 Chief Complaint: Rectal bleeding Symptoms: Blood in stool with nausea, sweating, abdominal pressure Frequency: today Pertinent Negatives: Patient denies vomiting, hx of hemorrhoids Disposition: [] ED /[] Urgent Care (no appt availability in office) / [x] Appointment(In office/virtual)/ []  [facility]/ [] Home Care/ [] Refused Recommended Disposition /[] [place] [facility]/ []  Follow-up with PCP Additional Notes: Patient called in stating he had a bowel movement this morning where he felt weak, nauseous, sweaty, faint. Patient had another bowel movement afterwards and noticed quite a bit of bright red blood, describes as more than just a few drops. Patient states he feels pressure in his stomach and nausea, but no further sweating. Patient appt made for further evaluation for today.    Copied from CRM [id number]. Topic: Clinical - Red Word Triage >> Aug 24, 2023 11:51 AM Aletta Edouard wrote: [facility] that prompted transfer to Nurse Triage: patient is weak and has been throwing up and has been bleeding from recum  he has also been sweating a lot as well Reason for Disposition  MILD rectal bleeding (more than just a few drops or streaks)  Answer Assessment - Initial Assessment Questions 1. APPEARANCE of BLOOD: "What color is it?" "Is it passed separately, on the surface of the stool, or mixed in with the stool?"      Bright red 2. AMOUNT: "How much blood was passed?"      Patient stated red blood around stool  3. FREQUENCY: "How many times has blood been passed with the stools?"      Twice 4. ONSET: "When was the blood first seen in the stools?" (Days or weeks)      This morning 5. DIARRHEA: "Is there also some diarrhea?" If Yes, ask: "How many diarrhea stools in the past 24 hours?"      Just one this morning 6. CONSTIPATION: "Do you have constipation?" If Yes, ask: "How bad is it?"     Not recently  7. RECURRENT SYMPTOMS: "Have you had blood in your stools before?" If Yes, ask: "When was  the last time?" and "What happened that time?"      No - no history of hemorrhoid 8. BLOOD THINNERS: "Do you take any blood thinners?" (e.g., Coumadin/warfarin, Pradaxa/dabigatran, aspirin)     No 9. OTHER SYMPTOMS: "Do you have any other symptoms?"  (e.g., abdomen pain, vomiting, dizziness, fever)     Abdominal pressure, nausea  Protocols used: Rectal Bleeding-A-AH

## 2023-08-24 NOTE — Progress Notes (Signed)
   08/24/2023  Patient ID: Miguel [name], male   DOB: [birth year], 53 y.o.   MRN: [id number]  Receive a voicemail from patient requesting a call back.  Return call to patient. Reports had constipation yesterday and then overnight noticed blood in his stool. Patient asks if bloody stool is related to [facility]. States also wonders if symptoms are related to eating ribs for supper last night  Note Mounjaro may cause constipation as a side effect. Patient reports that he has been tolerating this same dose of Mounjaro 7.5 mg weekly well now for >1 month.  Recommend patient contact PCP to request appointment/notify provider of concern of bloody stool. Patient states had some relief of constipation this morning and will consider contacting office if needed.  Estelle Grumbles, PharmD, Patsy [place], CPP Clinical Pharmacist [facility] [phone]

## 2023-08-24 NOTE — Progress Notes (Addendum)
 Subjective:    Patient ID: Miguel [name], male    DOB: [birth year], 53 y.o.   MRN: [id number]  Miguel [name] is a 53 y.o. male presenting on 08/24/2023 for Rectal Bleeding  Patient presents for a same day appointment.  HPI   Discussed the use of AI scribe software for clinical note transcription with the patient, who gave verbal consent to proceed.  History of Present Illness   Miguel [name] is a 53 year old male who presents with rectal bleeding and abdominal discomfort.  He experienced rectal bleeding that began early this morning around 4 AM. The blood was bright red and significant in amount, more than he would expect from a hemorrhoid. He has not experienced rectal bleeding before and had a normal colonoscopy in October 2024.  He woke up drenched in sweat and feeling faint, with a sensation of needing to pass stool. He experienced abdominal cramping and pressure, feeling as though he still needed to have a bowel movement. After the initial episode, he passed liquid stool with visible blood. No rectal pain during bowel movements, but he felt nauseous and sweated profusely during the episode. He has been feeling 'blah' throughout the day, with increasing abdominal pressure as the day progresses. He has not engaged in any strenuous activities recently and has been maintaining a regular diet.  He has a history of constipation and straining, which has improved since starting Mounjaro. He has not experienced any significant changes in his diet recently, although he has been eating less over time.      08/24/2023    2:45 PM 07/03/2023    3:03 PM 04/08/2023    8:18 AM  Depression screen PHQ 2/9  Decreased Interest 0 0 0  Down, Depressed, Hopeless 0 0 0  PHQ - 2 Score 0 0 0  Altered sleeping 0 3 0  Tired, decreased energy 0 3 3  Change in appetite 0 0 0  Feeling bad or failure about yourself  0 0 0  Trouble concentrating 0 0 0  Moving slowly or fidgety/restless 0 0 0   Suicidal thoughts 0 0 0  PHQ-9 Score 0 6 3  Difficult doing work/chores  Somewhat difficult        08/24/2023    2:46 PM 07/03/2023    3:03 PM 04/08/2023    8:18 AM 09/30/2022    8:15 AM  GAD 7 : Generalized Anxiety Score  Nervous, Anxious, on Edge 0 0 0 0  Control/stop worrying 0 0 0 0  Worry too much - different things 0 0 0 0  Trouble relaxing 0 0 0 0  Restless 0 0 0 0  Easily annoyed or irritable 0 0 0 0  Afraid - awful might happen 0 0 0 0  Total GAD 7 Score 0 0 0 0    Social History   Tobacco Use   Smoking status: Never   Smokeless tobacco: Never  Substance Use Topics   Alcohol use: No   Drug use: No    Review of Systems Per HPI unless specifically indicated above     Objective:    BP 110/68 (BP Location: Left Arm, Patient Position: Sitting, Cuff Size: Normal)   Pulse 63   Ht 5\' 10"  (1.778 m)   Wt 212 lb (96.2 kg)   SpO2 99%   BMI 30.42 kg/m   Wt Readings from Last 3 Encounters:  08/24/23 212 lb (96.2 kg)  07/03/23 223 lb (101.2  kg)  04/08/23 215 lb (97.5 kg)    Physical Exam Vitals and nursing note reviewed.  Constitutional:      General: He is not in acute distress.    Appearance: Normal appearance. He is well-developed. He is not diaphoretic.     Comments: Well-appearing, comfortable, cooperative  HENT:     Head: Normocephalic and atraumatic.  Eyes:     General:        Right eye: No discharge.        Left eye: No discharge.     Conjunctiva/sclera: Conjunctivae normal.  Cardiovascular:     Rate and Rhythm: Normal rate.  Pulmonary:     Effort: Pulmonary effort is normal.  Abdominal:     General: There is no distension.     Palpations: There is no mass.     Tenderness: There is no abdominal tenderness. There is no guarding.     Hernia: No hernia is present.     Comments: Reduced bowel sounds. Non tender on exam.  Skin:    General: Skin is warm and dry.     Findings: No erythema or rash.  Neurological:     Mental Status: He is alert and  oriented to person, place, and time.  Psychiatric:        Mood and Affect: Mood normal.        Behavior: Behavior normal.        Thought Content: Thought content normal.     Comments: Well groomed, good eye contact, normal speech and thoughts     Results for orders placed or performed in visit on 04/08/23  Hemoglobin A1c   Collection Time: 04/08/23  8:43 AM  Result Value Ref Range   Hgb A1c MFr Bld 7.3 (H) <5.7 % of total Hgb   Mean Plasma Glucose 163 mg/dL   eAG (mmol/L) 9.0 mmol/L  Lipid panel   Collection Time: 04/08/23  8:43 AM  Result Value Ref Range   Cholesterol 139 <200 mg/dL   HDL 37 (L) > OR = 40 mg/dL   Triglycerides 83 <161 mg/dL   LDL Cholesterol (Calc) 85 mg/dL (calc)   Total CHOL/HDL Ratio 3.8 <5.0 (calc)   Non-HDL Cholesterol (Calc) 102 <130 mg/dL (calc)  COMPLETE METABOLIC PANEL WITH GFR   Collection Time: 04/08/23  8:43 AM  Result Value Ref Range   Glucose, Bld 127 (H) 65 - 99 mg/dL   BUN 13 7 - 25 mg/dL   Creat 0.96 0.45 - 4.09 mg/dL   eGFR 811 > OR = 60 BJ/YNW/2.95A2   BUN/Creatinine Ratio SEE NOTE: 6 - 22 (calc)   Sodium 140 135 - 146 mmol/L   Potassium 4.5 3.5 - 5.3 mmol/L   Chloride 104 98 - 110 mmol/L   CO2 28 20 - 32 mmol/L   Calcium 9.7 8.6 - 10.3 mg/dL   Total Protein 6.8 6.1 - 8.1 g/dL   Albumin 4.7 3.6 - 5.1 g/dL   Globulin 2.1 1.9 - 3.7 g/dL (calc)   AG Ratio 2.2 1.0 - 2.5 (calc)   Total Bilirubin 1.3 (H) 0.2 - 1.2 mg/dL   Alkaline phosphatase (APISO) 89 35 - 144 U/L   AST 18 10 - 35 U/L   ALT 23 9 - 46 U/L  CBC with Differential/Platelet   Collection Time: 04/08/23  8:43 AM  Result Value Ref Range   WBC 5.1 3.8 - 10.8 Thousand/uL   RBC 5.40 4.20 - 5.80 Million/uL   Hemoglobin 17.1 13.2 - 17.1 g/dL  HCT 50.2 (H) 38.5 - 50.0 %   MCV 93.0 80.0 - 100.0 fL   MCH 31.7 27.0 - 33.0 pg   MCHC 34.1 32.0 - 36.0 g/dL   RDW 46.9 62.9 - 52.8 %   Platelets 225 140 - 400 Thousand/uL   MPV 10.5 7.5 - 12.5 fL   Neutro Abs 3,239 1,500 - 7,800  cells/uL   Absolute Lymphocytes 1,224 850 - 3,900 cells/uL   Absolute Monocytes 525 200 - 950 cells/uL   Eosinophils Absolute 82 15 - 500 cells/uL   Basophils Absolute 31 0 - 200 cells/uL   Neutrophils Relative % 63.5 %   Total Lymphocyte 24.0 %   Monocytes Relative 10.3 %   Eosinophils Relative 1.6 %   Basophils Relative 0.6 %  PSA   Collection Time: 04/08/23  8:43 AM  Result Value Ref Range   PSA 0.42 < OR = 4.00 ng/mL  Microalbumin / creatinine urine ratio   Collection Time: 04/08/23  8:43 AM  Result Value Ref Range   Creatinine, Urine 96 20 - 320 mg/dL   Microalb, Ur 0.8 mg/dL   Microalb Creat Ratio 8 <30 mg/g creat  TSH   Collection Time: 04/08/23  8:43 AM  Result Value Ref Range   TSH 0.89 0.40 - 4.50 mIU/L   I have personally reviewed the radiology report from 08/24/23 on [facility].  CLINICAL DATA:  Constipation.   EXAM: ABDOMEN - 1 VIEW   COMPARISON:  04/01/2022   FINDINGS: Two views of the abdomen and pelvis, without upright or supine labeling. Non-obstructive bowel gas pattern. No free intraperitoneal air. Minimal right hemidiaphragm elevation. 5 mm calcification projects over the lower pole left kidney. Pelvic calcifications are most likely phleboliths and are similar.   IMPRESSION: No acute process or explanation for patient's symptoms.   Probable left nephrolithiasis.     Electronically Signed   By: Jeronimo Greaves M.D.   On: 08/24/2023 16:56    Assessment & Plan:   Problem List Items Addressed This Visit   None Visit Diagnoses       Bright red rectal bleeding    -  Primary   Relevant Orders   DG Abd 1 View     Constipation, unspecified constipation type       Relevant Orders   DG Abd 1 View     Nasal congestion       Relevant Medications   ipratropium (ATROVENT) 0.06 % nasal spray        Rectal Bleeding Acute bright red rectal bleeding x 2 episode moderate amount likely from a lower GI source, possibly an internal hemorrhoid or small blood  vessel. Recent normal colonoscopy 02/2023 (no hemorrhoids or specimens collected, no polyps). No significant pain during bowel movements. Vasovagal episode as well associated, near syncope. Spontaneous resolution possible, but persistent bleeding may require gastroenterology evaluation.  No other GI red flags other than bleeding.  - STAT Order abdominal x-ray to assess for constipation or other abnormalities. - Monitor bleeding pattern and symptoms over the next 48 hours. - If bleeding persists or worsens, refer to gastroenterology for further evaluation.  Constipation Constipation improved with Mounjaro. Bowel movements every other day. Constipation and straining may have contributed to rectal bleeding. - Order abdominal x-ray to assess for constipation. - Continue fiber supplementation to prevent constipation and reduce straining. - Okay to use Miralax powder, as advised  BPH Use of Saw Palmetto Taking saw palmetto 160 mg twice a day. No significant side effects or interactions  expected. - Continue saw palmetto as per label instructions.  Nasal Congestion Rhinitis Refill Atrovent   Update 08/24/23 544pm released results to MyChart  Here is X-ray result from today.   It does not show any issue with the bowels that would explain the symptoms. There was no significant constipation built up.    They did see a 5 mm calcification that is likely a Left sided kidney stone present.   No obvious explanation. At this point, still seems like internal hemorrhoid or small blood vessel bleeding is explanation for now.    Keep in touch and let us know the pattern.   Orders Placed This Encounter  Procedures   DG Abd 1 View    Standing Status:   Future    Number of Occurrences:   1    Expiration Date:   11/23/2023    Reason for Exam (SYMPTOM  OR DIAGNOSIS REQUIRED):   constipation, bright red rectal bleeding    Preferred imaging location?:   [facility]    Meds ordered this encounter   Medications   ipratropium (ATROVENT) 0.06 % nasal spray    Sig: Place 2 sprays into both nostrils 4 (four) times daily as needed for rhinitis.    Dispense:  15 mL    Refill:  3    Follow up plan: Return if symptoms worsen or fail to improve.   Saralyn Pilar, DO [facility] 08/24/2023, 2:55 PM

## 2023-08-31 ENCOUNTER — Encounter: Payer: Self-pay | Admitting: Family Medicine

## 2023-09-14 ENCOUNTER — Other Ambulatory Visit (INDEPENDENT_AMBULATORY_CARE_PROVIDER_SITE_OTHER): Admitting: Pharmacist

## 2023-09-14 DIAGNOSIS — Z7985 Long-term (current) use of injectable non-insulin antidiabetic drugs: Secondary | ICD-10-CM

## 2023-09-14 DIAGNOSIS — Z794 Long term (current) use of insulin: Secondary | ICD-10-CM

## 2023-09-14 DIAGNOSIS — E118 Type 2 diabetes mellitus with unspecified complications: Secondary | ICD-10-CM

## 2023-09-14 NOTE — Patient Instructions (Signed)
Goals Addressed             This Visit's Progress    Pharmacy Goals       Our goal A1c is less than 7%. This corresponds with fasting sugars less than 130 and 2 hour after meal sugars less than 180. Please check your blood sugar and keep a log of the results  Our goal bad cholesterol, or LDL, is less than 70 . This is why it is important to continue taking your atorvastatin.  Feel free to call me with any questions or concerns. I look forward to our next call!  Elisabeth Delles, PharmD, BCACP, CPP Clinical Pharmacist [facility] [phone]        

## 2023-09-14 NOTE — Progress Notes (Unsigned)
 09/14/2023 Name: Miguel [name] MRN: [id number] DOB: 1970/07/05  Chief Complaint  Patient presents with   Medication Management    [name] is [name] 53 y.o. year old male who presented for [name] telephone visit.   They were referred to the pharmacist by their PCP for assistance in managing diabetes and medication access.      Subjective:   Care Team: Primary Care Provider: [name], DO; Next Scheduled Visit: 10/06/2023 Pain Management: Dr. Binnie [facility]; Next Scheduled Visit: 10/16/2023 Neurology: [facility] Urology: Dustin Gimenez, MD [facility]  Medication Access/Adherence  Current Pharmacy:  [facility] - [place], [place] - [address] [place] [address] Phone: [phone] Fax: [phone]  [id number] [place], [place] - [address] AT [facility] OF [address] [place] [address] Phone: [phone] Fax: [phone]  [facility] - [place], [place] - [address] [place] [address] Phone: [phone] Fax: [phone]   Patient reports affordability concerns with their medications: No  Patient reports access/transportation concerns to their pharmacy: No  Patient reports adherence concerns with their medications:  No     Type 2 Diabetes:   Current medications:  Mounjaro 7.5 mg weekly on Sundays (increased to current dose on 3/9) Reports tolerating well Jardiance  25 mg daily with breakfast  Reports self-discontinued Tresiba  ~5 days ago   Medications tried in the past: metformin  (IR GI intolerance/felt ER at tolerated dose was ineffective), Tresiba     Note blood sugar control impacted by receiving steroid infusions and injections: Receives steroid injections ~every 3 months for pain management and steroid infusions every 6 months with multiple sclerosis treatment (Ocrelizumab infusion)    Patient uses Dexcom G7 Continuous Glucose Monitoring (CGM)   Date of Download: 09/14/23 % Time CGM is active: 99% Average Glucose: 185 mg/dL Glucose Management Indicator: 7.7%  Glucose Variability: 19.5% (goal <36%) Time in Goal:  - Time in range 70-180: 52% - Time above range: 48% - Time below range: 0%   Reports mostly having well balanced meals spread throughout the day while controlling portion sizes of carbohydrates, put occasionally sees blood sugar rise with [name] higher carbohydrate meal   Uses CGM as tool to reinforce/aid with dietary choices and improve blood sugar control   Current physical activity: Walking throughout the day at work/at home walking ~30 minutes x most days/week.   Reports continues to work on weight loss, current home weight ~204 lbs   Statin therapy: atorvastatin  20 mg daily   Patient denies hypoglycemic s/sx including dizziness, shakiness, sweating.  Confirms carries glucose tablets in case of symptoms of hypoglycemia     Objective:  Lab Results  Component Value Date   HGBA1C 7.3 (H) 04/08/2023    Lab Results  Component Value Date   CREATININE 0.79 04/08/2023   BUN 13 04/08/2023   NA 140 04/08/2023   K 4.5 04/08/2023   CL 104 04/08/2023   CO2 28 04/08/2023    Lab Results  Component Value Date   CHOL 139 04/08/2023   HDL 37 (L) 04/08/2023   LDLCALC 85 04/08/2023   TRIG 83 04/08/2023   CHOLHDL 3.8 04/08/2023    Medications Reviewed Today     Reviewed by Ardis Becton, RPH-CPP (Pharmacist) on 09/14/23 at 1122  Med List Status: <None>   Medication Order Taking? Sig Documenting Provider  Last Dose Status Informant  amphetamine-dextroamphetamine (ADDERALL) 20 MG tablet [id number]  Take 20 mg by mouth daily. [provider]  Active   atorvastatin  (LIPITOR) 20 MG tablet [id number]  Take 1 tablet (20 mg total) by mouth daily. Karamalegos, Miguel Party, DO  Active   baclofen (LIORESAL) 10 MG tablet [id number]  Take 1 tablet by  mouth 3 (three) times daily as needed. [provider]  Active   busPIRone  (BUSPAR ) 5 MG tablet [id number]  Take 1 tablet (5 mg total) by mouth 3 (three) times daily as needed (anxiety). Karamalegos, Miguel Party, DO  Active   cetirizine (ZYRTEC) 10 MG tablet [id number]  Take 10 mg by mouth daily as needed for allergies. [provider]  Active   Cholecalciferol (D 1000) 1000 units capsule [id number]  Take by mouth.  [provider]  Active            Med Note Deetta Farrow, Miguel [name]   Thu May 10, 2015  9:44 AM) Received from: [facility]  Continuous Glucose Sensor [facility] G7 SENSOR) [place] [id number]  Use as directed. Apply 1 sensor every 10 days. Miguel Bunting, DO  Active   Cyanocobalamin (VITAMIN B-12) 5000 MCG SUBL [id number]  Place under the tongue. [provider]  Active            Med Note Deetta Farrow, Miguel [name]   Thu May 10, 2015  9:44 AM) Received from: [facility]  dalfampridine 10 [place] ZD66 [id number]   [provider]  Active   desonide  (DESOWEN ) 0.05 % cream [id number]  Apply topically daily as needed. Miguel Bunting, DO  Active   empagliflozin  (JARDIANCE ) 25 MG TABS tablet [id number] Yes Take 1 tablet (25 mg total) by mouth daily. Miguel Bunting, DO Taking Active   gabapentin (NEURONTIN) 300 MG capsule [id number]  Take 300 mg by mouth 3 (three) times daily. [provider]  Active            Med Note Deetta Farrow, Miguel [name]   Thu May 10, 2015  9:44 AM) Received from: [facility]  glucose blood [facility] ULTRA) test strip [id number]  USE TO CHECK BLOOD SUGAR THREE TIMES DAILY AS DIRECTED Miguel Bunting, DO  Active   ibuprofen (ADVIL,MOTRIN) 800 MG tablet [id number]  Take 800 mg by mouth every 6 (six) hours as needed. [provider]  Active            Med Note Deetta Farrow, Miguel [name]   Thu May 10, 2015  9:44 AM) Received from: [facility]  Insulin  Pen Needle (B-D UF III MINI PEN NEEDLES) 31G X 5 MM MISC  [id number]  Use to inject insulin .  DX: E11.9 Miguel Bunting, DO  Active   ipratropium (ATROVENT ) 0.06 % nasal spray [id number]  Place 2 sprays into both nostrils 4 (four) times daily as needed for rhinitis. Miguel Bunting, DO  Active   ketoconazole  (NIZORAL ) 2 % shampoo [id number]  Apply 1 application topically daily as needed. Miguel Bunting, DO  Active   Lancets [facility] ULTRASOFT) lancets [id number]  Check blood sugar three times daily. Miguel Bunting, DO  Active   lisinopril  (ZESTRIL ) 2.5 MG tablet [id number]  Take 1 tablet (2.5 mg total) by mouth daily. Miguel Bunting, DO  Active   Multiple Vitamins-Minerals (PRESERVISION AREDS PO) [id number]  Take by mouth. [provider]  Active   Ocrelizumab (OCREVUS IV) [id number]  Inject into the vein. [provider]  Active   saw palmetto 500 MG capsule [id number]  Take 500 mg by mouth daily. [provider]  Active   tirzepatide [facility]) 7.5 MG/0.5ML Pen [id number] Yes Inject 7.5 mg into the skin once [name] week. Miguel Bunting, DO Taking Active   tiZANidine  (ZANAFLEX ) 4 MG tablet [id number]  TAKE 1/2 TO 1 TABLET(2 TO 4 MG) BY MOUTH EVERY 8 HOURS AS NEEDED FOR MUSCLE SPASMS Karamalegos, Miguel Party, DO  Active   traMADol (ULTRAM) 50 MG tablet [id number]  Take 1 tablet by mouth daily as needed. [provider]  Active               Assessment/Plan:   Diabetes: - Recommend patient continue Mounjaro 7.5 mg weekly and Jardiance  25 mg daily with breakfast and follow up with PCP as planned next month Agreeable to discuss possible increase in Mounjaro dose at upcoming appointment depending on A1C result - Counsel on dietary modifications including having regular well-balanced meals throughout the day and controlling carbohydrate portion sizes - Recommend to continue to use CGM as tool to reinforce/aid with dietary choices and improve blood sugar control Patient to  continue to use glucometer to complete fingerstick reading when needed for symptoms/unsure of CGM data or if instructed by CGM device - Have reviewed symptoms of/how to manage low blood sugar. Encourage patient to continue to carry glucose tablets and contact office as needed for any symptoms of hypoglycemia     Follow Up Plan: Clinic Pharmacist will follow up with patient by telephone on 11/16/2023 at 8:30 AM    Arthur Lash, PharmD, Becky Bowels, CPP Clinical Pharmacist [facility] [phone]

## 2023-10-06 ENCOUNTER — Encounter: Payer: Self-pay | Admitting: Family Medicine

## 2023-10-06 ENCOUNTER — Other Ambulatory Visit: Payer: Self-pay | Admitting: Family Medicine

## 2023-10-06 ENCOUNTER — Ambulatory Visit (INDEPENDENT_AMBULATORY_CARE_PROVIDER_SITE_OTHER): Payer: Self-pay | Admitting: Family Medicine

## 2023-10-06 VITALS — BP 122/80 | HR 86 | Ht 70.0 in | Wt 204.5 lb

## 2023-10-06 DIAGNOSIS — G35 Multiple sclerosis: Secondary | ICD-10-CM

## 2023-10-06 DIAGNOSIS — R0981 Nasal congestion: Secondary | ICD-10-CM

## 2023-10-06 DIAGNOSIS — Z794 Long term (current) use of insulin: Secondary | ICD-10-CM

## 2023-10-06 DIAGNOSIS — M62830 Muscle spasm of back: Secondary | ICD-10-CM | POA: Diagnosis not present

## 2023-10-06 DIAGNOSIS — E118 Type 2 diabetes mellitus with unspecified complications: Secondary | ICD-10-CM

## 2023-10-06 DIAGNOSIS — Z0000 Encounter for general adult medical examination without abnormal findings: Secondary | ICD-10-CM

## 2023-10-06 DIAGNOSIS — E663 Overweight: Secondary | ICD-10-CM

## 2023-10-06 DIAGNOSIS — Z125 Encounter for screening for malignant neoplasm of prostate: Secondary | ICD-10-CM

## 2023-10-06 DIAGNOSIS — Z7985 Long-term (current) use of injectable non-insulin antidiabetic drugs: Secondary | ICD-10-CM

## 2023-10-06 DIAGNOSIS — G4733 Obstructive sleep apnea (adult) (pediatric): Secondary | ICD-10-CM

## 2023-10-06 DIAGNOSIS — E1169 Type 2 diabetes mellitus with other specified complication: Secondary | ICD-10-CM

## 2023-10-06 DIAGNOSIS — N401 Enlarged prostate with lower urinary tract symptoms: Secondary | ICD-10-CM

## 2023-10-06 LAB — POCT GLYCOSYLATED HEMOGLOBIN (HGB A1C): Hemoglobin A1C: 6.2 % — AB (ref 4.0–5.6)

## 2023-10-06 MED ORDER — IPRATROPIUM BROMIDE 0.06 % NA SOLN: 2.0000 | Freq: Four times a day (QID) | NASAL | 3 refills | Status: AC | PRN

## 2023-10-06 MED ORDER — MOUNJARO 7.5 MG/0.5ML ~~LOC~~ SOAJ: 7.5000 mg | SUBCUTANEOUS | 1 refills | Status: DC

## 2023-10-06 NOTE — Patient Instructions (Addendum)
 Thank you for coming to the office today.  Recent Labs    04/08/23 0843 10/06/23 0818  HGBA1C 7.3* 6.2*   Contact us  if interested in dose increase Mounjaro from 7.5 to 10mg   Currently very well controlled Keep up the great work  Re ordered the nasal spray Atrovent  to [facility]  DUE for FASTING BLOOD WORK (no food or drink after midnight before the lab appointment, only water or coffee without cream/sugar on the morning of)  SCHEDULE "Lab Only" visit in the morning at the clinic for lab draw in 6 MONTHS   - Make sure Lab Only appointment is at about 1 week before your next appointment, so that results will be available  For Lab Results, once available within 2-3 days of blood draw, you can can log in to MyChart online to view your results and a brief explanation. Also, we can discuss results at next follow-up visit.   Please schedule a Follow-up Appointment to: Return in about 6 months (around 04/07/2024) for 6 month fasting lab > 1 week later Annual Physical.  If you have any other questions or concerns, please feel free to call the office or send a message through MyChart. You may also schedule an earlier appointment if necessary.  Additionally, you may be receiving a survey about your experience at our office within a few days to 1 week by e-mail or mail. We value your feedback.  Domingo Friend, DO [facility], [place]

## 2023-10-06 NOTE — Progress Notes (Signed)
 Subjective:    Patient ID: Miguel [name], male    DOB: 28-Dec-1970, 53 y.o.   MRN: [id number]  Miguel [name] is a 53 y.o. male presenting on 10/06/2023 for Diabetes   HPI  Discussed the use of AI scribe software for clinical note transcription with the patient, who gave verbal consent to proceed.  History of Present Illness   Miguel [name] is a 53 year old male with diabetes who presents for follow-up on his blood glucose management.   FOLLOW-UP CHRONIC DM, Type 2: See prior notes for background information. A1c due today, Improved to 6.2 or previous results 7.3 Dexcom G7 CGM reviewed data, improving significantly with % in the appropriate range -80%. Past 1 month with improvement. He has better pain control overall On Mounjaro 7.5mg  weekly Continues Jardiance  25mg  daily - Remains OFF Metformin  and Basal Insulin  entirely now Currently on ACEi Lifestyle: Improving diet exercise Denies hypoglycemia (rare 70-80 reading but had no symptoms)  Rhinitis Needs new rx Atrovent  nasal AS NEEDED   Chronic Neuropathic Pain / Back Pain / Multiple Sclerosis Followed by [facility]. See outside record for details. Continues on disease modifying therapy infusion every few months, rarely has flare up requiring bag steroid infusion. - Taking Gabapentin, Tizanidine , Tramadol - temporary relief - Has been receiving spinal injections as well - Significant improvement still on Tizanidine  2-4mg  dosage PRN with great results to help pain. Takes it early to prevent. Has rarely taken Baclofen PRN for spasms.  On Dalfampridine He has multiple sclerosis and takes medication for muscle spasms and stiffness. He reduced the dosage due to a sensitive tongue and experiences urinary symptoms like difficulty starting urination and a weak stream, which he attributes to medication side effects or age-related prostate changes.  He finds physical therapy beneficial for his neuropathic pain and  multiple sclerosis symptoms, attending sessions twice a week for eight weeks every six months. He uses muscle relaxers as needed, which provide significant relief, and he has reduced the dosage to half a pill due to its potency.     OSA, on CPAP - Patient reports prior history of dx OSA and on CPAP - Today reports that sleep apnea is well controlled.  uses the CPAP machine every night. Tolerates the machine well, and thinks that sleeps better with it and feels good. No new concerns or symptoms.  BPH  New concern. Weaker stream with urination, frequency at times. Nocturia Never on med for this, has been on Tamsulosin  for kidney stone in the past He is now on saw Palmetto for BPH. Doing well and will contact us  if need other med      10/06/2023    1:22 PM 08/24/2023    2:45 PM 07/03/2023    3:03 PM  Depression screen PHQ 2/9  Decreased Interest 0 0 0  Down, Depressed, Hopeless 0 0 0  PHQ - 2 Score 0 0 0  Altered sleeping 0 0 3  Tired, decreased energy 0 0 3  Change in appetite 0 0 0  Feeling bad or failure about yourself  0 0 0  Trouble concentrating 0 0 0  Moving slowly or fidgety/restless 0 0 0  Suicidal thoughts 0 0 0  PHQ-9 Score 0 0 6  Difficult doing work/chores Not difficult at all  Somewhat difficult       10/06/2023    1:22 PM 08/24/2023    2:46 PM 07/03/2023    3:03 PM 04/08/2023  8:18 AM  GAD 7 : Generalized Anxiety Score  Nervous, Anxious, on Edge 0 0 0 0  Control/stop worrying 0 0 0 0  Worry too much - different things 0 0 0 0  Trouble relaxing 0 0 0 0  Restless 0 0 0 0  Easily annoyed or irritable 0 0 0 0  Afraid - awful might happen 0 0 0 0  Total GAD 7 Score 0 0 0 0  Anxiety Difficulty Not difficult at all       Social History   Tobacco Use   Smoking status: Never   Smokeless tobacco: Never  Substance Use Topics   Alcohol use: No   Drug use: No    Review of Systems Per HPI unless specifically indicated above     Objective:     BP 122/80 (BP  Location: Left Arm, Patient Position: Sitting, Cuff Size: Normal)   Pulse 86   Ht 5\' 10"  (1.778 m)   Wt 204 lb 8 oz (92.8 kg)   SpO2 98%   BMI 29.34 kg/m   Wt Readings from Last 3 Encounters:  10/06/23 204 lb 8 oz (92.8 kg)  08/24/23 212 lb (96.2 kg)  07/03/23 223 lb (101.2 kg)    Physical Exam Vitals and nursing note reviewed.  Constitutional:      General: He is not in acute distress.    Appearance: He is well-developed. He is not diaphoretic.     Comments: Well-appearing, comfortable, cooperative  HENT:     Head: Normocephalic and atraumatic.  Eyes:     General:        Right eye: No discharge.        Left eye: No discharge.     Conjunctiva/sclera: Conjunctivae normal.  Neck:     Thyroid: No thyromegaly.  Cardiovascular:     Rate and Rhythm: Normal rate and regular rhythm.     Pulses: Normal pulses.     Heart sounds: Normal heart sounds. No murmur heard. Pulmonary:     Effort: Pulmonary effort is normal. No respiratory distress.     Breath sounds: Normal breath sounds. No wheezing or rales.  Musculoskeletal:        General: Normal range of motion.     Cervical back: Normal range of motion and neck supple.  Lymphadenopathy:     Cervical: No cervical adenopathy.  Skin:    General: Skin is warm and dry.     Findings: No erythema or rash.  Neurological:     Mental Status: He is alert and oriented to person, place, and time. Mental status is at baseline.  Psychiatric:        Behavior: Behavior normal.     Comments: Well groomed, good eye contact, normal speech and thoughts    Recent Labs    04/08/23 0843 10/06/23 0818  HGBA1C 7.3* 6.2*     Results for orders placed or performed in visit on 10/06/23  POCT HgB A1C   Collection Time: 10/06/23  8:18 AM  Result Value Ref Range   Hemoglobin A1C 6.2 (A) 4.0 - 5.6 %   HbA1c POC (<> result, manual entry)     HbA1c, POC (prediabetic range)     HbA1c, POC (controlled diabetic range)        Assessment & Plan:    Problem List Items Addressed This Visit     Multiple sclerosis (HCC)   OSA on CPAP   Overweight (BMI 25.0-29.9)   Other Visit Diagnoses  Controlled type 2 diabetes mellitus with complication, with long-term current use of insulin  (HCC)    -  Primary   Relevant Medications   MOUNJARO 7.5 MG/0.5ML Pen   Other Relevant Orders   POCT HgB A1C (Completed)     Spasm of thoracic back muscle         Nasal congestion       Relevant Medications   ipratropium (ATROVENT ) 0.06 % nasal spray     Long-term current use of injectable noninsulin antidiabetic medication            Type 2 Diabetes Mellitus Diabetes well-controlled with Mounjaro. CGM shows 77% in range over three days, 80% over seven days. Discontinued insulin  basal, considering short acting with steroid infusions for MS in future.  He is followed by our [facility] clinical pharmacist Prefers current Mounjaro dose due to stable glucose and no side effects. Weight loss improved glucose control. - Continue Mounjaro 7.5 mg, three-month supply. Reconsider dose inc to Mounjaro 10mg  in future - Monitor glucose, contact clinic if dosage increase needed.  Rhinitis - Reorder ipratropium nasal spray from [facility].  Multiple Sclerosis Followed by Neurologist. Symptoms managed with physical therapy and muscle relaxants.  - Continue regular physical therapy. - Use muscle relaxants as needed - Tizanidine  - He receives infusions periodically  OSA on CPAP - Good adherence to CPAP nightly - Continue current CPAP therapy, patient seems to be benefiting from therapy    Route chart to Arthur Lash [facility] CPP for review regarding his Diabetes status and medication dosing updates   Orders Placed This Encounter  Procedures   POCT HgB A1C    Meds ordered this encounter  Medications   MOUNJARO 7.5 MG/0.5ML Pen    Sig: Inject 7.5 mg into the skin once a week.    Dispense:  6 mL    Refill:  1    3 month supply   ipratropium  (ATROVENT ) 0.06 % nasal spray    Sig: Place 2 sprays into both nostrils 4 (four) times daily as needed for rhinitis.    Dispense:  15 mL    Refill:  3    Follow up plan: Return in about 6 months (around 04/07/2024) for 6 month fasting lab > 1 week later Annual Physical.  Future labs ordered for 03/24/24   Domingo Friend, DO [facility] 10/06/2023, 8:17 AM

## 2023-10-22 LAB — HM DIABETES EYE EXAM

## 2023-11-16 ENCOUNTER — Other Ambulatory Visit

## 2023-11-27 ENCOUNTER — Other Ambulatory Visit: Admitting: Pharmacist

## 2023-11-27 DIAGNOSIS — Z794 Long term (current) use of insulin: Secondary | ICD-10-CM

## 2023-11-27 DIAGNOSIS — E1169 Type 2 diabetes mellitus with other specified complication: Secondary | ICD-10-CM

## 2023-11-27 DIAGNOSIS — E118 Type 2 diabetes mellitus with unspecified complications: Secondary | ICD-10-CM

## 2023-11-27 NOTE — Patient Instructions (Signed)
Goals Addressed             This Visit's Progress    Pharmacy Goals       Our goal A1c is less than 7%. This corresponds with fasting sugars less than 130 and 2 hour after meal sugars less than 180. Please check your blood sugar and keep a log of the results  Our goal bad cholesterol, or LDL, is less than 70 . This is why it is important to continue taking your atorvastatin.  Feel free to call me with any questions or concerns. I look forward to our next call!  Elisabeth Delles, PharmD, BCACP, CPP Clinical Pharmacist [facility] [phone]        

## 2023-11-27 NOTE — Progress Notes (Signed)
 11/27/2023 Name: Miguel [name] MRN: [id number] DOB: Nov 08, 1970  Chief Complaint  Patient presents with   Medication Management    Miguel [name] is a 53 y.o. year old male who presented for a telephone visit.   They were referred to the pharmacist by their PCP for assistance in managing diabetes and medication access.      Subjective:   Care Team: Primary Care Provider: Edman Marsa PARAS, DO; Next Scheduled Visit: 03/29/2024 Pain Management: Dr. Oliva [facility]; Next Scheduled Visit: 01/13/2024 Neurology: [facility] Urology: Penne Knee, MD [facility]  Medication Access/Adherence  Current Pharmacy:  [facility] [place], [place] - [address] [place] [address] Phone: [phone] Fax: [phone]  [facility] - [place], [place] - [address] [place] [address] Phone: [phone] Fax: [phone]  [id number] [place], [place] - [address] [place] [address] Phone: [phone] Fax: [phone]   Patient reports affordability concerns with their medications: No  Patient reports access/transportation concerns to their pharmacy: No  Patient reports adherence concerns with their medications:  No     Type 2 Diabetes:   Current medications:  - Mounjaro  7.5 mg weekly on Sundays Reports tolerating well - Jardiance  25 mg daily with breakfast    Medications tried in the past: metformin  (IR GI intolerance/felt ER at tolerated dose was ineffective), Tresiba     Note blood sugar control impacted by receiving steroid infusions and injections: Receives steroid injections ~every 3 months for pain management and steroid infusions every 6 months with multiple sclerosis treatment (Ocrelizumab infusion)   Patient uses Dexcom G7 Continuous Glucose Monitoring (CGM)   Date of Download: 11/27/23 % Time CGM is active: 91% Average  Glucose: 159 mg/dL Glucose Management Indicator: 7.1%  Glucose Variability: 20.2% (goal <36%) Time in Goal:  - Time in range 70-180: 81% - Time above range: 19% - Time below range: 0%   Uses CGM as tool to reinforce/aid with dietary choices and improve blood sugar control   Current physical activity: Walking throughout the day at work/at home walking ~30 minutes x most days/week. Attending PT x 2 days/week   Reports continues to work on weight loss, current home weight ~198 lbs   Statin therapy: atorvastatin  20 mg daily   Patient denies hypoglycemic s/sx including dizziness, shakiness, sweating.  Confirms carries glucose tablets in case of symptoms of hypoglycemia     Objective:  Lab Results  Component Value Date   HGBA1C 6.2 (A) 10/06/2023    Lab Results  Component Value Date   CREATININE 0.79 04/08/2023   BUN 13 04/08/2023   NA 140 04/08/2023   K 4.5 04/08/2023   CL 104 04/08/2023   CO2 28 04/08/2023    Lab Results  Component Value Date   CHOL 139 04/08/2023   HDL 37 (L) 04/08/2023   LDLCALC 85 04/08/2023   TRIG 83 04/08/2023   CHOLHDL 3.8 04/08/2023    Current Outpatient Medications on File Prior to Visit  Medication Sig Dispense Refill   atorvastatin  (LIPITOR) 20 MG tablet Take 1 tablet (20 mg total) by mouth daily. 90 tablet 3   empagliflozin  (JARDIANCE ) 25 MG TABS tablet Take 1 tablet (25 mg total) by mouth daily. 90 tablet 3   MOUNJARO  7.5 MG/0.5ML Pen Inject 7.5 mg into the skin once a week. 6 mL 1   amphetamine-dextroamphetamine (ADDERALL) 20 MG  tablet Take 20 mg by mouth daily.     baclofen (LIORESAL) 10 MG tablet Take 1 tablet by mouth 3 (three) times daily as needed.     busPIRone  (BUSPAR ) 5 MG tablet Take 1 tablet (5 mg total) by mouth 3 (three) times daily as needed (anxiety). (Patient not taking: Reported on 10/06/2023) 90 tablet 3   cetirizine (ZYRTEC) 10 MG tablet Take 10 mg by mouth daily as needed for allergies.     Cholecalciferol (D 1000)  1000 units capsule Take by mouth.      Continuous Glucose Sensor (DEXCOM G7 SENSOR) MISC Use as directed. Apply 1 sensor every 10 days. 3 each 12   Cyanocobalamin (VITAMIN B-12) 5000 MCG SUBL Place under the tongue.     dalfampridine 10 MG TB12      desonide  (DESOWEN ) 0.05 % cream Apply topically daily as needed. 30 g 2   gabapentin (NEURONTIN) 300 MG capsule Take 300 mg by mouth 3 (three) times daily.     glucose blood (ONETOUCH ULTRA) test strip USE TO CHECK BLOOD SUGAR THREE TIMES DAILY AS DIRECTED 300 strip 3   ibuprofen (ADVIL,MOTRIN) 800 MG tablet Take 800 mg by mouth every 6 (six) hours as needed.     Insulin  Pen Needle (B-D UF III MINI PEN NEEDLES) 31G X 5 MM MISC Use to inject insulin .  DX: E11.9 100 each 3   ipratropium (ATROVENT ) 0.06 % nasal spray Place 2 sprays into both nostrils 4 (four) times daily as needed for rhinitis. 15 mL 3   ketoconazole  (NIZORAL ) 2 % shampoo Apply 1 application topically daily as needed. 120 mL 2   Lancets (ONETOUCH ULTRASOFT) lancets Check blood sugar three times daily. 100 each 12   lisinopril  (ZESTRIL ) 2.5 MG tablet Take 1 tablet (2.5 mg total) by mouth daily. 90 tablet 3   Multiple Vitamins-Minerals (PRESERVISION AREDS PO) Take by mouth.     Ocrelizumab (OCREVUS IV) Inject into the vein.     saw palmetto 500 MG capsule Take 500 mg by mouth daily.     tiZANidine  (ZANAFLEX ) 4 MG tablet TAKE 1/2 TO 1 TABLET(2 TO 4 MG) BY MOUTH EVERY 8 HOURS AS NEEDED FOR MUSCLE SPASMS 60 tablet 3   traMADol (ULTRAM) 50 MG tablet Take 1 tablet by mouth daily as needed.     No current facility-administered medications on file prior to visit.       Assessment/Plan:   Diabetes: - Controlled - Agree to continue Mounjaro  7.5 mg weekly and Jardiance  25 mg daily with breakfast - Counsel on dietary modifications including having regular well-balanced meals throughout the day and controlling carbohydrate portion sizes - Recommend to continue to use CGM as tool to  reinforce/aid with dietary choices and improve blood sugar control Patient to continue to use glucometer to complete fingerstick reading when needed for symptoms/unsure of CGM data or if instructed by CGM device - Have reviewed symptoms of/how to manage low blood sugar. Encourage patient to continue to carry glucose tablets and contact office as needed for any symptoms of hypoglycemia     Follow Up Plan: Clinic Pharmacist will follow up with patient by telephone on 02/01/2024 at 9:00 AM    Sharyle Sia, PharmD, JAQUELINE, CPP Clinical Pharmacist [facility] [phone]

## 2024-02-01 ENCOUNTER — Other Ambulatory Visit (INDEPENDENT_AMBULATORY_CARE_PROVIDER_SITE_OTHER): Admitting: Pharmacist

## 2024-02-01 DIAGNOSIS — E1169 Type 2 diabetes mellitus with other specified complication: Secondary | ICD-10-CM

## 2024-02-01 DIAGNOSIS — Z794 Long term (current) use of insulin: Secondary | ICD-10-CM

## 2024-02-01 NOTE — Progress Notes (Signed)
 02/01/2024 Name: Miguel [name] MRN: [id number] DOB: September 02, 1970  Chief Complaint  Patient presents with   Medication Management    Miguel [name] is [name] 53 y.o. year old male who presented for [name] telephone visit.   They were referred to the pharmacist by their PCP for assistance in managing diabetes and medication access.      Subjective:   Care Team: Primary Care Provider: Edman Marsa PARAS, DO; Next Scheduled Visit: 03/29/2024 Pain Management: Dr. Oliva [facility]: [facility] Urology: Miguel Knee, MD [facility]  Medication Access/Adherence  Current Pharmacy:  [phone] [place], [place] - [address] [place] [address] Phone: [phone] Fax: [phone]  [facility] - [place], [place] - [address] [place] [address] Phone: [phone] Fax: [phone]  [facility] - [place], [place] - [address] [place] [address] Phone: [phone] Fax: [phone]   Patient reports affordability concerns with their medications: No  Patient reports access/transportation concerns to their pharmacy: No  Patient reports adherence concerns with their medications:  No     Type 2 Diabetes:   Current medications:  - Mounjaro  7.5 mg weekly on Sundays Reports tolerating well - Jardiance  25 mg daily with breakfast    Medications tried in the past: metformin  (IR GI intolerance/felt ER at tolerated dose was ineffective), Tresiba     Note blood sugar control impacted by receiving steroid infusions and injections: Receives steroid injections ~every 3 months for pain management and steroid infusions every 6 months with multiple sclerosis treatment (Ocrelizumab infusion)   Patient uses Dexcom G7 Continuous Glucose Monitoring (CGM)   Date of Download: 02/01/24 % Time CGM is active: 70% Average Glucose: 168 mg/dL Glucose Management  Indicator: 7.3%  Glucose Variability: 22.1% (goal <36%) Time in Goal:  - Time in range 70-180: 70% - Time above range: 30% - Time below range: 0%       Uses CGM as tool to reinforce/aid with dietary choices and improve blood sugar control   Current physical activity: Walking throughout the day at work/at home walking ~30 minutes x most days/week.    Reports prefers to maintain weight, current home weight ~196 lbs   Statin therapy: atorvastatin  20 mg daily   Patient denies hypoglycemic s/sx including dizziness, shakiness, sweating.  Confirms carries glucose tablets in case of symptoms of hypoglycemia   Objective:  Lab Results  Component Value Date   HGBA1C 6.2 ([name]) 10/06/2023    Lab Results  Component Value Date   CREATININE 0.79 04/08/2023   BUN 13 04/08/2023   NA 140 04/08/2023   K 4.5 04/08/2023   CL 104 04/08/2023   CO2 28 04/08/2023    Lab Results  Component Value Date   CHOL 139 04/08/2023   HDL 37 (L) 04/08/2023   LDLCALC 85 04/08/2023   TRIG 83 04/08/2023   CHOLHDL 3.8 04/08/2023   BP Readings from Last 3 Encounters:  10/06/23 122/80  08/24/23 110/68  07/03/23 126/78   Pulse Readings from Last 3 Encounters:  10/06/23 86  08/24/23 63  07/03/23 96    Medications Reviewed Today     Reviewed by Alana Sharyle LABOR, RPH-CPP (Pharmacist) on 02/01/24 at [phone]  Med List Status: <None>   Medication Order Taking? Sig Documenting Provider Last Dose Status Informant  amphetamine-dextroamphetamine (ADDERALL XR) 30 MG 24 hr capsule [id number] Yes Take 30 mg by  mouth daily. [provider]  Active     Discontinued 02/01/24 0911   atorvastatin  (LIPITOR) 20 MG tablet [id number]  Take 1 tablet (20 mg total) by mouth daily. [name], Marsa PARAS, DO  Active   baclofen (LIORESAL) 10 MG tablet [id number]  Take 1 tablet by mouth 3 (three) times daily as needed. [provider]  Active   busPIRone  (BUSPAR ) 5 MG tablet [id number]  Take 1 tablet (5 mg  total) by mouth 3 (three) times daily as needed (anxiety).  Patient not taking: Reported on 10/06/2023   Edman Marsa PARAS, DO  Active   cetirizine (ZYRTEC) 10 MG tablet [id number]  Take 10 mg by mouth daily as needed for allergies. [provider]  Active   Cholecalciferol (D 1000) 1000 units capsule [id number]  Take by mouth.  [provider]  Active            Med Note Miguel [name]   Thu May 10, 2015  9:44 AM) Received from: [facility]  Continuous Glucose Sensor [facility] G7 SENSOR) [place] [id number]  Use as directed. Apply 1 sensor every 10 days. Edman Marsa PARAS, DO  Active   Cyanocobalamin (VITAMIN B-12) 5000 MCG SUBL [id number]  Place under the tongue. [provider]  Active            Med Note Miguel [name]   Thu May 10, 2015  9:44 AM) Received from: [facility]  dalfampridine 10 [place] UA87 [id number]   [provider]  Active   Miguel [name]  (DESOWEN ) 0.05 % cream [id number]  Apply topically daily as needed. Edman Marsa PARAS, DO  Active   empagliflozin  (JARDIANCE ) 25 MG TABS tablet [id number] Yes Take 1 tablet (25 mg total) by mouth daily. [name], Marsa PARAS, DO  Active   gabapentin (NEURONTIN) 300 MG capsule [id number]  Take 300 mg by mouth 3 (three) times daily. [provider]  Active            Med Note Miguel [name]   Thu May 10, 2015  9:44 AM) Received from: [facility]  glucose blood [facility] ULTRA) test strip [id number]  USE TO CHECK BLOOD SUGAR THREE TIMES DAILY AS DIRECTED Edman Marsa PARAS, DO  Active   ibuprofen (ADVIL,MOTRIN) 800 MG tablet [id number]  Take 800 mg by mouth every 6 (six) hours as needed. [provider]  Active            Med Note Miguel [name]   Thu May 10, 2015  9:44 AM) Received from: [facility]  Insulin  Pen Needle (B-D UF III MINI PEN NEEDLES) 31G X 5 MM MISC [id number]  Use to inject insulin .  DX: E11.9 [name], Miguel J, DO  Active   ipratropium  (ATROVENT ) 0.06 % nasal spray [id number]  Place 2 sprays into both nostrils 4 (four) times daily as needed for rhinitis. Edman Marsa PARAS, DO  Active   ketoconazole  (NIZORAL ) 2 % shampoo [id number]  Apply 1 application topically daily as needed. Edman Marsa PARAS, DO  Active   Lancets [facility] ULTRASOFT) lancets [id number]  Check blood sugar three times daily. [name], Marsa PARAS, DO  Active   lisinopril  (ZESTRIL ) 2.5 MG tablet [id number]  Take 1 tablet (2.5 mg total) by mouth daily. Edman Marsa PARAS, DO  Active   MOUNJARO  7.5 MG/0.5ML Pen [id number] Yes Inject 7.5 mg into the skin once [name] week. Edman Marsa PARAS, DO  Active   Multiple Vitamins-Minerals (PRESERVISION AREDS PO) [id number]  Take by mouth. [provider]  Active   Ocrelizumab (OCREVUS IV) [id number]  Inject into the vein. [provider]  Active   saw palmetto 500 MG capsule [id number]  Take 500 mg by mouth daily. [provider]  Active   tiZANidine  (ZANAFLEX ) 4 MG tablet [id number]  TAKE 1/2 TO 1 TABLET(2 TO 4 MG) BY MOUTH EVERY 8 HOURS AS NEEDED FOR MUSCLE SPASMS [name], Marsa PARAS, DO  Active   traMADol (ULTRAM) 50 MG tablet [id number]  Take 1 tablet by mouth daily as needed. [provider]  Active               Assessment/Plan:   Diabetes: - Controlled - Recommend to continue Mounjaro  7.5 mg weekly and Jardiance  25 mg daily with breakfast - Counsel on dietary modifications including having regular well-balanced meals throughout the day and controlling carbohydrate portion sizes - Recommend to continue to use CGM as tool to reinforce/aid with dietary choices and improve blood sugar control Patient to continue to use glucometer to complete fingerstick reading when needed for symptoms/unsure of CGM data or if instructed by CGM device - Have reviewed symptoms of/how to manage low blood sugar. Encourage patient to continue to carry glucose tablets and  contact office as needed for any symptoms of hypoglycemia  Patient plans to obtain his influenza vaccine over next month   Follow Up Plan: Clinic Pharmacist will follow up with patient by telephone on 05/30/2024 at 9:00 AM    Sharyle Sia, PharmD, JAQUELINE, CPP Clinical Pharmacist [facility] [phone]

## 2024-02-01 NOTE — Patient Instructions (Signed)
Goals Addressed             This Visit's Progress    Pharmacy Goals       Our goal A1c is less than 7%. This corresponds with fasting sugars less than 130 and 2 hour after meal sugars less than 180. Please check your blood sugar and keep a log of the results  Our goal bad cholesterol, or LDL, is less than 70 . This is why it is important to continue taking your atorvastatin.  Feel free to call me with any questions or concerns. I look forward to our next call!  Elisabeth Delles, PharmD, BCACP, CPP Clinical Pharmacist [facility] [phone]        

## 2024-03-19 ENCOUNTER — Other Ambulatory Visit: Payer: Self-pay | Admitting: Family Medicine

## 2024-03-19 DIAGNOSIS — E118 Type 2 diabetes mellitus with unspecified complications: Secondary | ICD-10-CM

## 2024-03-21 NOTE — Telephone Encounter (Signed)
 Requested medication (s) are due for refill today: Yes  Requested medication (s) are on the active medication list: Yes  Last refill:  10/06/23  Future visit scheduled: Yes  Notes to clinic:  Manual review.    Requested Prescriptions  Pending Prescriptions Disp Refills   MOUNJARO  7.5 MG/0.5ML Pen [Pharmacy Med Name: MOUNJARO  7.5 MG/0.5 ML PEN]  1    Sig: INJECT 7.5 MG SUBCUTANEOUSLY WEEKLY     Off-Protocol Failed - 03/21/2024  3:25 PM      Failed - Medication not assigned to a protocol, review manually.      Passed - Valid encounter within last 12 months    Recent Outpatient Visits           5 months ago Controlled type 2 diabetes mellitus with complication, with long-term current use of insulin  [facility])   [place] [facility], Marsa PARAS, DO   7 months ago Bright red rectal bleeding   [place] [facility] Edman Marsa PARAS, DO   8 months ago Controlled type 2 diabetes mellitus with complication, with long-term current use of insulin  [facility])    [facility] [place], Marsa PARAS, [place]

## 2024-03-24 ENCOUNTER — Other Ambulatory Visit

## 2024-03-24 DIAGNOSIS — N401 Enlarged prostate with lower urinary tract symptoms: Secondary | ICD-10-CM

## 2024-03-24 DIAGNOSIS — Z0000 Encounter for general adult medical examination without abnormal findings: Secondary | ICD-10-CM

## 2024-03-24 DIAGNOSIS — Z125 Encounter for screening for malignant neoplasm of prostate: Secondary | ICD-10-CM

## 2024-03-24 DIAGNOSIS — G35D Multiple sclerosis, unspecified: Secondary | ICD-10-CM

## 2024-03-24 DIAGNOSIS — E1169 Type 2 diabetes mellitus with other specified complication: Secondary | ICD-10-CM

## 2024-03-25 ENCOUNTER — Other Ambulatory Visit

## 2024-03-26 LAB — CBC WITH DIFFERENTIAL/PLATELET
Absolute Lymphocytes: 1361 {cells}/uL (ref 850–3900)
Absolute Monocytes: 572 {cells}/uL (ref 200–950)
Basophils Absolute: 22 {cells}/uL (ref 0–200)
Basophils Relative: 0.4 %
Eosinophils Absolute: 81 {cells}/uL (ref 15–500)
Eosinophils Relative: 1.5 %
HCT: 51.7 % — ABNORMAL HIGH (ref 38.5–50.0)
Hemoglobin: 17.6 g/dL — ABNORMAL HIGH (ref 13.2–17.1)
MCH: 32.2 pg (ref 27.0–33.0)
MCHC: 34 g/dL (ref 32.0–36.0)
MCV: 94.7 fL (ref 80.0–100.0)
MPV: 10.2 fL (ref 7.5–12.5)
Monocytes Relative: 10.6 %
Neutro Abs: 3364 {cells}/uL (ref 1500–7800)
Neutrophils Relative %: 62.3 %
Platelets: 246 Thousand/uL (ref 140–400)
RBC: 5.46 Million/uL (ref 4.20–5.80)
RDW: 12.2 % (ref 11.0–15.0)
Total Lymphocyte: 25.2 %
WBC: 5.4 Thousand/uL (ref 3.8–10.8)

## 2024-03-26 LAB — MICROALBUMIN / CREATININE URINE RATIO
Creatinine, Urine: 51 mg/dL (ref 20–320)
Microalb Creat Ratio: 12 mg/g{creat} (ref <30)
Microalb, Ur: 0.6 mg/dL

## 2024-03-26 LAB — COMPREHENSIVE METABOLIC PANEL WITH GFR
AG Ratio: 2.3 (calc) (ref 1.0–2.5)
ALT: 27 U/L (ref 9–46)
AST: 21 U/L (ref 10–35)
Albumin: 4.9 g/dL (ref 3.6–5.1)
Alkaline phosphatase (APISO): 87 U/L (ref 35–144)
BUN/Creatinine Ratio: 27 (calc) — ABNORMAL HIGH (ref 6–22)
BUN: 18 mg/dL (ref 7–25)
CO2: 29 mmol/L (ref 20–32)
Calcium: 9.7 mg/dL (ref 8.6–10.3)
Chloride: 102 mmol/L (ref 98–110)
Creat: 0.66 mg/dL — ABNORMAL LOW (ref 0.70–1.30)
Globulin: 2.1 g/dL (ref 1.9–3.7)
Glucose, Bld: 116 mg/dL — ABNORMAL HIGH (ref 65–99)
Potassium: 4 mmol/L (ref 3.5–5.3)
Sodium: 140 mmol/L (ref 135–146)
Total Bilirubin: 1 mg/dL (ref 0.2–1.2)
Total Protein: 7 g/dL (ref 6.1–8.1)
eGFR: 113 mL/min/1.73m2 (ref >=60)

## 2024-03-26 LAB — LIPID PANEL
Cholesterol: 139 mg/dL (ref <200)
HDL: 38 mg/dL — ABNORMAL LOW (ref >=40)
LDL Cholesterol (Calc): 85 mg/dL
Non-HDL Cholesterol (Calc): 101 mg/dL (ref <130)
Total CHOL/HDL Ratio: 3.7 (calc) (ref <5.0)
Triglycerides: 72 mg/dL (ref <150)

## 2024-03-26 LAB — TSH: TSH: 1.05 m[IU]/L (ref 0.40–4.50)

## 2024-03-26 LAB — HEMOGLOBIN A1C
Hgb A1c MFr Bld: 6.3 % — ABNORMAL HIGH (ref <5.7)
Mean Plasma Glucose: 134 mg/dL
eAG (mmol/L): 7.4 mmol/L

## 2024-03-26 LAB — PSA: PSA: 0.63 ng/mL (ref <=4.00)

## 2024-03-29 ENCOUNTER — Ambulatory Visit: Admitting: Family Medicine

## 2024-03-29 ENCOUNTER — Encounter: Payer: Self-pay | Admitting: Family Medicine

## 2024-03-29 VITALS — BP 122/80 | HR 91 | Ht 70.0 in | Wt 195.0 lb

## 2024-03-29 DIAGNOSIS — E785 Hyperlipidemia, unspecified: Secondary | ICD-10-CM

## 2024-03-29 DIAGNOSIS — Z0000 Encounter for general adult medical examination without abnormal findings: Secondary | ICD-10-CM | POA: Diagnosis not present

## 2024-03-29 DIAGNOSIS — F419 Anxiety disorder, unspecified: Secondary | ICD-10-CM | POA: Diagnosis not present

## 2024-03-29 DIAGNOSIS — Z7985 Long-term (current) use of injectable non-insulin antidiabetic drugs: Secondary | ICD-10-CM

## 2024-03-29 DIAGNOSIS — E1169 Type 2 diabetes mellitus with other specified complication: Secondary | ICD-10-CM | POA: Diagnosis not present

## 2024-03-29 DIAGNOSIS — M47814 Spondylosis without myelopathy or radiculopathy, thoracic region: Secondary | ICD-10-CM

## 2024-03-29 DIAGNOSIS — G4733 Obstructive sleep apnea (adult) (pediatric): Secondary | ICD-10-CM

## 2024-03-29 DIAGNOSIS — M62830 Muscle spasm of back: Secondary | ICD-10-CM

## 2024-03-29 DIAGNOSIS — Z23 Encounter for immunization: Secondary | ICD-10-CM

## 2024-03-29 DIAGNOSIS — Z794 Long term (current) use of insulin: Secondary | ICD-10-CM

## 2024-03-29 DIAGNOSIS — B360 Pityriasis versicolor: Secondary | ICD-10-CM

## 2024-03-29 DIAGNOSIS — G35D Multiple sclerosis, unspecified: Secondary | ICD-10-CM

## 2024-03-29 MED ORDER — EMPAGLIFLOZIN 25 MG PO TABS: 25.0000 mg | ORAL_TABLET | Freq: Every day | ORAL | 3 refills | Status: AC

## 2024-03-29 MED ORDER — ATORVASTATIN CALCIUM 20 MG PO TABS: 20.0000 mg | ORAL_TABLET | Freq: Every day | ORAL | 3 refills | Status: AC

## 2024-03-29 MED ORDER — TIZANIDINE HCL 4 MG PO TABS
ORAL_TABLET | ORAL | 3 refills | Status: AC
Start: 2024-03-29 — End: ?

## 2024-03-29 MED ORDER — LISINOPRIL 2.5 MG PO TABS: 2.5000 mg | ORAL_TABLET | Freq: Every day | ORAL | 3 refills | Status: AC

## 2024-03-29 MED ORDER — DESONIDE 0.05 % EX CREA
TOPICAL_CREAM | Freq: Every day | CUTANEOUS | 2 refills | Status: AC | PRN
Start: 2024-03-29 — End: ?

## 2024-03-29 NOTE — Progress Notes (Signed)
 Subjective:    Patient ID: Miguel [name], male    DOB: 1971-05-19, 53 y.o.   MRN: [id number]  [name] is a 53 y.o. male presenting on 03/29/2024 for Annual Exam   HPI   Discussed the use of AI scribe software for clinical note transcription with the patient, who gave verbal consent to proceed.  History of Present Illness   [name] is a 53 year old male with diabetes, multiple sclerosis, and arthritis who presents for a routine follow-up visit.   Arthralgia and musculoskeletal symptoms - Arthritis affecting knuckles, hip, and back - Previous intra-articular injection in hip - Considering rheumatology evaluation due to abnormal blood work Requesting future referral  Hyperlipidemia On Atorvastatin  20mg  - LDL cholesterol in the 80s  Dermatologic symptoms - Occasional use of desonide  cream - Increased skin bleeding and bruising from minor scratches - Concern regarding possible vitamin deficiency  CHRONIC DM, Type 2: See prior notes for background information. - Diabetes mellitus with current hemoglobin A1c of 6.3, slightly increased from 6.2 - Weight loss from 212 lbs in April to 195 lbs currently - Diet consists mainly of meat and vegetables; bread eliminated - Frequent dining out due to travel Dexcom G7 CGM Current meds On Mounjaro  7.5mg  weekly Continues Jardiance  25mg  daily - Remains OFF Metformin  and Basal Insulin  entirely now Denies hypoglycemia    Chronic Neuropathic Pain / Back Pain / Multiple Sclerosis Followed by [facility]. See outside record for details. Continues on disease modifying therapy infusion every few months, rarely has flare up requiring bag steroid infusion. - Taking Gabapentin, Tizanidine , Tramadol - Has been receiving spinal injections as well - Significant improvement still on Tizanidine  2-4mg  dosage PRN with great results to help pain. Takes it early to prevent. Has rarely taken Baclofen PRN for spasms.  On  Dalfampridine He has multiple sclerosis and takes medication for muscle spasms and stiffness. He reduced the dosage due to a sensitive tongue and experiences urinary symptoms like difficulty starting urination and a weak stream, which he attributes to medication side effects or age-related prostate changes.  He finds physical therapy beneficial for his neuropathic pain and multiple sclerosis symptoms    OSA, on CPAP - Patient reports prior history of dx OSA and on CPAP - Today reports that sleep apnea is well controlled.  uses the CPAP machine every night. Tolerates the machine well, and thinks that sleeps better with it and feels good. No new concerns or symptoms.   BPH  On Saw Palmetto   Health Maintenance: Flu Shot and Prevnar-20 today     03/29/2024    8:03 AM 10/06/2023    1:22 PM 08/24/2023    2:45 PM  Depression screen PHQ 2/9  Decreased Interest 0 0 0  Down, Depressed, Hopeless 0 0 0  PHQ - 2 Score 0 0 0  Altered sleeping  0 0  Tired, decreased energy  0 0  Change in appetite  0 0  Feeling bad or failure about yourself   0 0  Trouble concentrating  0 0  Moving slowly or fidgety/restless  0 0  Suicidal thoughts  0 0  PHQ-9 Score  0  0   Difficult doing work/chores  Not difficult at all      Data saved with a previous flowsheet row definition       03/29/2024    8:04 AM 10/06/2023    1:22 PM 08/24/2023    2:46 PM 07/03/2023  3:03 PM  GAD 7 : Generalized Anxiety Score  Nervous, Anxious, on Edge 0 0 0 0  Control/stop worrying 0 0 0 0  Worry too much - different things 0 0 0 0  Trouble relaxing 0 0 0 0  Restless 0 0 0 0  Easily annoyed or irritable 0 0 0 0  Afraid - awful might happen 0 0 0 0  Total GAD 7 Score 0 0 0 0  Anxiety Difficulty  Not difficult at all       Past Medical History:  Diagnosis Date   Allergy    Calculus of kidney 05/09/2013   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   Renal colic 05/09/2013   Shoulder pain 07/23/2015   Sleep apnea    Past Surgical History:  Procedure Laterality Date   COLONOSCOPY WITH PROPOFOL  N/A 02/26/2023   Procedure: COLONOSCOPY WITH PROPOFOL ;  Surgeon: Therisa Bi, MD;  Location: [facility] ENDOSCOPY;  Service: Gastroenterology;  Laterality: N/A;   HERNIA REPAIR     POLYPECTOMY  02/26/2023   Procedure: POLYPECTOMY;  Surgeon: Therisa Bi, MD;  Location: [facility] ENDOSCOPY;  Service: Gastroenterology;;   URETEROSCOPY WITH HOLMIUM LASER LITHOTRIPSY     x 2   Social History   Socioeconomic History   Marital status: Married    Spouse name: Not on file   Number of children: Not on file   Years of education: Not on file   Highest education level: Not on file  Occupational History   Not on file  Tobacco Use   Smoking status: Never   Smokeless tobacco: Never  Substance and Sexual Activity   Alcohol use: No   Drug use: No   Sexual activity: Not on file  Other Topics Concern   Not on file  Social History Narrative   Not on file   Social Drivers of Health   Financial Resource Strain: Not on file  Food Insecurity: Not on file  Transportation Needs: Not on file  Physical Activity: Not on file  Stress: Not on file  Social Connections: Not on file  Intimate Partner Violence: Not on file   Family History  Problem Relation Age of Onset   Colon cancer Neg Hx    Prostate cancer Neg Hx    Bladder Cancer Neg Hx    Kidney cancer Neg Hx    Current Outpatient Medications on File Prior  to Visit  Medication Sig   amphetamine-dextroamphetamine (ADDERALL XR) 30 MG 24 hr capsule Take 30 mg by mouth daily.   baclofen (LIORESAL) 10 MG tablet Take 1 tablet by mouth 3 (three) times daily as needed.   cetirizine (ZYRTEC) 10 MG tablet Take 10 mg by mouth daily as needed for allergies.   Cholecalciferol (D 1000) 1000 units capsule Take by mouth.    Continuous Glucose Sensor (DEXCOM G7 SENSOR) MISC Use as directed. Apply 1 sensor every 10 days.   Cyanocobalamin (VITAMIN B-12) 5000 MCG SUBL Place under the tongue.  dalfampridine 10 MG TB12    gabapentin (NEURONTIN) 300 MG capsule Take 300 mg by mouth 3 (three) times daily.   glucose blood (ONETOUCH ULTRA) test strip USE TO CHECK BLOOD SUGAR THREE TIMES DAILY AS DIRECTED   ibuprofen (ADVIL,MOTRIN) 800 MG tablet Take 800 mg by mouth every 6 (six) hours as needed.   Insulin  Pen Needle (B-D UF III MINI PEN NEEDLES) 31G X 5 MM MISC Use to inject insulin .  DX: E11.9   ipratropium (ATROVENT ) 0.06 % nasal spray Place 2 sprays into both nostrils 4 (four) times daily as needed for rhinitis.   ketoconazole  (NIZORAL ) 2 % shampoo Apply 1 application topically daily as needed.   Lancets (ONETOUCH ULTRASOFT) lancets Check blood sugar three times daily.   MOUNJARO  7.5 MG/0.5ML Pen INJECT 7.5 MG SUBCUTANEOUSLY WEEKLY   Multiple Vitamins-Minerals (PRESERVISION AREDS PO) Take by mouth.   Ocrelizumab (OCREVUS IV) Inject into the vein.   saw palmetto 500 MG capsule Take 500 mg by mouth daily.   traMADol (ULTRAM) 50 MG tablet Take 1 tablet by mouth daily as needed.   No current facility-administered medications on file prior to visit.    Review of Systems  Constitutional:  Negative for activity change, appetite change, chills, diaphoresis, fatigue and fever.  HENT:  Negative for congestion and hearing loss.   Eyes:  Negative for visual disturbance.  Respiratory:  Negative for cough, chest tightness, shortness of breath and wheezing.   Cardiovascular:   Negative for chest pain, palpitations and leg swelling.  Gastrointestinal:  Negative for abdominal pain, constipation, diarrhea, nausea and vomiting.  Genitourinary:  Negative for dysuria, frequency and hematuria.  Musculoskeletal:  Negative for arthralgias and neck pain.  Skin:  Negative for rash.  Neurological:  Negative for dizziness, weakness, light-headedness, numbness and headaches.  Hematological:  Negative for adenopathy.  Psychiatric/Behavioral:  Negative for behavioral problems, dysphoric mood and sleep disturbance.    Per HPI unless specifically indicated above     Objective:    BP 122/80 (BP Location: Left Arm, Patient Position: Sitting, Cuff Size: Normal)   Pulse 91   Ht 5' 10 (1.778 m)   Wt 195 lb (88.5 kg)   SpO2 100%   BMI 27.98 kg/m   Wt Readings from Last 3 Encounters:  03/29/24 195 lb (88.5 kg)  10/06/23 204 lb 8 oz (92.8 kg)  08/24/23 212 lb (96.2 kg)    Physical Exam Vitals and nursing note reviewed.  Constitutional:      General: He is not in acute distress.    Appearance: He is well-developed. He is not diaphoretic.     Comments: Well-appearing, comfortable, cooperative  HENT:     Head: Normocephalic and atraumatic.  Eyes:     General:        Right eye: No discharge.        Left eye: No discharge.     Conjunctiva/sclera: Conjunctivae normal.     Pupils: Pupils are equal, round, and reactive to light.  Neck:     Thyroid: No thyromegaly.     Vascular: No carotid bruit.  Cardiovascular:     Rate and Rhythm: Normal rate and regular rhythm.     Pulses: Normal pulses.     Heart sounds: Normal heart sounds. No murmur heard. Pulmonary:     Effort: Pulmonary effort is normal. No respiratory distress.     Breath sounds: Normal breath sounds. No wheezing or rales.  Abdominal:     General: Bowel sounds are normal. There is  no distension.     Palpations: Abdomen is soft. There is no mass.     Tenderness: There is no abdominal tenderness.   Musculoskeletal:        General: No tenderness. Normal range of motion.     Cervical back: Normal range of motion and neck supple.     Right lower leg: No edema.     Left lower leg: No edema.     Comments: Upper / Lower Extremities: - Normal muscle tone, strength bilateral upper extremities 5/5, lower extremities 5/5  Lymphadenopathy:     Cervical: No cervical adenopathy.  Skin:    General: Skin is warm and dry.     Findings: No erythema or rash.  Neurological:     Mental Status: He is alert and oriented to person, place, and time.     Comments: Distal sensation intact to light touch all extremities  Psychiatric:        Mood and Affect: Mood normal.        Behavior: Behavior normal.        Thought Content: Thought content normal.     Comments: Well groomed, good eye contact, normal speech and thoughts     Diabetic Foot Exam - Simple   Simple Foot Form Diabetic Foot exam was performed with the following findings: Yes 03/29/2024  8:33 AM  Visual Inspection See comments: Yes Sensation Testing Intact to touch and monofilament testing bilaterally: Yes Pulse Check Posterior Tibialis and Dorsalis pulse intact bilaterally: Yes Comments Dry skin dermatitis some callus formation, intact monofilament sensation.      Results for orders placed or performed in visit on 03/24/24  Comprehensive metabolic panel with GFR   Collection Time: 03/25/24 10:15 AM  Result Value Ref Range   Glucose, Bld 116 (H) 65 - 99 mg/dL   BUN 18 7 - 25 mg/dL   Creat 9.33 (L) 9.29 - 1.30 mg/dL   eGFR 886 > OR = 60 fO/fpw/8.26f7   BUN/Creatinine Ratio 27 (H) 6 - 22 (calc)   Sodium 140 135 - 146 mmol/L   Potassium 4.0 3.5 - 5.3 mmol/L   Chloride 102 98 - 110 mmol/L   CO2 29 20 - 32 mmol/L   Calcium  9.7 8.6 - 10.3 mg/dL   Total Protein 7.0 6.1 - 8.1 g/dL   Albumin 4.9 3.6 - 5.1 g/dL   Globulin 2.1 1.9 - 3.7 g/dL (calc)   AG Ratio 2.3 1.0 - 2.5 (calc)   Total Bilirubin 1.0 0.2 - 1.2 mg/dL   Alkaline  phosphatase (APISO) 87 35 - 144 U/L   AST 21 10 - 35 U/L   ALT 27 9 - 46 U/L  TSH   Collection Time: 03/25/24 10:15 AM  Result Value Ref Range   TSH 1.05 0.40 - 4.50 mIU/L  Microalbumin / creatinine urine ratio   Collection Time: 03/25/24 10:15 AM  Result Value Ref Range   Creatinine, Urine 51 20 - 320 mg/dL   Microalb, Ur 0.6 mg/dL   Microalb Creat Ratio 12 <30 mg/g creat  PSA   Collection Time: 03/25/24 10:15 AM  Result Value Ref Range   PSA 0.63 < OR = 4.00 ng/mL  CBC with Differential/Platelet   Collection Time: 03/25/24 10:15 AM  Result Value Ref Range   WBC 5.4 3.8 - 10.8 Thousand/uL   RBC 5.46 4.20 - 5.80 Million/uL   Hemoglobin 17.6 (H) 13.2 - 17.1 g/dL   HCT 48.2 (H) 61.4 - 49.9 %   MCV 94.7 80.0 -  100.0 fL   MCH 32.2 27.0 - 33.0 pg   MCHC 34.0 32.0 - 36.0 g/dL   RDW 87.7 88.9 - 84.9 %   Platelets 246 140 - 400 Thousand/uL   MPV 10.2 7.5 - 12.5 fL   Neutro Abs 3,364 1,500 - 7,800 cells/uL   Absolute Lymphocytes 1,361 850 - 3,900 cells/uL   Absolute Monocytes 572 200 - 950 cells/uL   Eosinophils Absolute 81 15 - 500 cells/uL   Basophils Absolute 22 0 - 200 cells/uL   Neutrophils Relative % 62.3 %   Total Lymphocyte 25.2 %   Monocytes Relative 10.6 %   Eosinophils Relative 1.5 %   Basophils Relative 0.4 %  Hemoglobin A1c   Collection Time: 03/25/24 10:15 AM  Result Value Ref Range   Hgb A1c MFr Bld 6.3 (H) <5.7 %   Mean Plasma Glucose 134 mg/dL   eAG (mmol/L) 7.4 mmol/L  Lipid panel   Collection Time: 03/25/24 10:15 AM  Result Value Ref Range   Cholesterol 139 <200 mg/dL   HDL 38 (L) > OR = 40 mg/dL   Triglycerides 72 <849 mg/dL   LDL Cholesterol (Calc) 85 mg/dL (calc)   Total CHOL/HDL Ratio 3.7 <5.0 (calc)   Non-HDL Cholesterol (Calc) 101 <130 mg/dL (calc)      Assessment & Plan:   Problem List Items Addressed This Visit     Anxiety   Hyperlipidemia associated with type 2 diabetes mellitus (HCC)   Relevant Medications   atorvastatin  (LIPITOR) 20  MG tablet   empagliflozin  (JARDIANCE ) 25 MG TABS tablet   lisinopril  (ZESTRIL ) 2.5 MG tablet   Multiple sclerosis   OSA on CPAP   Type 2 diabetes mellitus with other specified complication (HCC)   Relevant Medications   atorvastatin  (LIPITOR) 20 MG tablet   empagliflozin  (JARDIANCE ) 25 MG TABS tablet   lisinopril  (ZESTRIL ) 2.5 MG tablet   Other Visit Diagnoses       Annual physical exam    -  Primary     Flu vaccine need       Relevant Orders   Flu vaccine trivalent PF, 6mos and older(Flulaval,Afluria,Fluarix,Fluzone) (Completed)     Need for Streptococcus pneumoniae vaccination       Relevant Orders   Pneumococcal conjugate vaccine 20-valent (Completed)     Spondylosis of thoracic region without myelopathy or radiculopathy       Relevant Medications   tiZANidine  (ZANAFLEX ) 4 MG tablet     Spasm of thoracic back muscle       Relevant Medications   tiZANidine  (ZANAFLEX ) 4 MG tablet     Tinea versicolor       Relevant Medications   desonide  (DESOWEN ) 0.05 % cream     Long-term current use of injectable noninsulin antidiabetic medication            Updated Health Maintenance information Reviewed recent lab results with patient Encouraged improvement to lifestyle with diet and exercise Goal of weight loss   Adult Wellness Visit Routine visit with well-controlled blood pressure and significant weight loss. No acute concerns. - Scheduled follow-up appointment in June. - Provided printed blood work results.  Type 2 diabetes mellitus Well-controlled with A1c at 6.3%. Current regimen effective with noted weight loss. - Continue Mounjaro  7.5 mg. - Continue Jardiance . - Monitor weight and A1c levels.  Hyperlipidemia Well-controlled with LDL in the 80s. Current management effective. - Continue current lipid-lowering therapy.  Hypertension Well-controlled with current medication regimen. - Continue current antihypertensive therapy.  Multiple Sclerosis  Followed by  Neurology Ongoing management with Dalfampridine -Continue current regimen as prescribed.  OSA on CPAP Well controlled, chronic OSA on CPAP - Good adherence to CPAP nightly - Continue current CPAP therapy, patient seems to be benefiting from therapy   - Refill desonide  cream as needed.  General Health Maintenance Discussed immunization status and optional screenings. - Recommended shingles vaccine at pharmacy. - Consider coronary calcium  scan in June or January.      Abnormal Rheumatology labs per Neuro, requesting copy of result and consider referral to Rheumatology in future   Orders Placed This Encounter  Procedures   Flu vaccine trivalent PF, 6mos and older(Flulaval,Afluria,Fluarix,Fluzone)   Pneumococcal conjugate vaccine 20-valent    Meds ordered this encounter  Medications   atorvastatin  (LIPITOR) 20 MG tablet    Sig: Take 1 tablet (20 mg total) by mouth daily.    Dispense:  90 tablet    Refill:  3    Add refills   empagliflozin  (JARDIANCE ) 25 MG TABS tablet    Sig: Take 1 tablet (25 mg total) by mouth daily.    Dispense:  90 tablet    Refill:  3   lisinopril  (ZESTRIL ) 2.5 MG tablet    Sig: Take 1 tablet (2.5 mg total) by mouth daily.    Dispense:  90 tablet    Refill:  3    Add refills   tiZANidine  (ZANAFLEX ) 4 MG tablet    Sig: TAKE 1/2 TO 1 TABLET(2 TO 4 MG) BY MOUTH EVERY 8 HOURS AS NEEDED FOR MUSCLE SPASMS    Dispense:  60 tablet    Refill:  3   desonide  (DESOWEN ) 0.05 % cream    Sig: Apply topically daily as needed.    Dispense:  30 g    Refill:  2     Follow up plan: Return in about 7 months (around 10/27/2024) for 7 month DM A1c, MS updates.  Add future Hepatitis B screening titer  Marsa Officer, DO [facility] 03/29/2024, 8:14 AM

## 2024-03-29 NOTE — Patient Instructions (Addendum)
 Thank you for coming to the office today.  Flu Shot today and Prevnar 20 pneumonia vaccine today  Future Shingles vaccine at pharmacy  Refills added today  Recent Labs    04/08/23 0843 10/06/23 0818 03/25/24 1015  HGBA1C 7.3* 6.2* 6.3*   Future Rheumatology referral - we would need a diagnosis and lab test results from your [facility].  We can consider referral once we review the record.  [facility] Address: [address], [place], [place] [address] Phone: [phone]  Okay to take a Hair Skin Nails Vitamin  -----------------  FUTURE CONSIDERATION  Coronary Calcium  Score Cardiac CT Scan. This is a screening test for patients aged 88-50+ with cardiovascular risk factors or who are healthy but would be interested in Cardiovascular Screening for heart disease. Even if there is a family history of heart disease, this imaging can be useful. Typically it can be done every 5+ years or at a different timeline we agree on  The scan will look at the chest and mainly focus on the heart and identify early signs of calcium  build up or blockages within the heart arteries. It is not 100% accurate for identifying blockages or heart disease, but it is useful to help us  predict who may have some early changes or be at risk in the future for a heart attack or cardiovascular problem.  The results are reviewed by a Cardiologist and they will document the results. It should become available on MyChart. Typically the results are divided into percentiles based on other patients of the same demographic and age. So it will compare your risk to others similar to you. If you have a higher score >99 or higher percentile >75%tile, it is recommended to consider Statin cholesterol therapy and or referral to Cardiologist. I will try to help explain your results and if we have questions we can contact the Cardiologist.  You will be contacted for scheduling. Usually it is done at  any imaging facility through [facility], [facility] or [facility].  The cost is $99 flat fee total and it does not go through insurance, so no authorization is required.    Please schedule a Follow-up Appointment to: Return in about 7 months (around 10/27/2024) for 7 month DM A1c, MS updates.  If you have any other questions or concerns, please feel free to call the office or send a message through MyChart. You may also schedule an earlier appointment if necessary.  Additionally, you may be receiving a survey about your experience at our office within a few days to 1 week by e-mail or mail. We value your feedback.  Marsa Officer, DO [facility], [place]

## 2024-04-25 ENCOUNTER — Encounter: Payer: Self-pay | Admitting: Family Medicine

## 2024-04-25 DIAGNOSIS — E1169 Type 2 diabetes mellitus with other specified complication: Secondary | ICD-10-CM

## 2024-04-29 ENCOUNTER — Ambulatory Visit
Admission: RE | Admit: 2024-04-29 | Discharge: 2024-04-29 | Payer: Self-pay | Attending: Family Medicine | Admitting: Family Medicine

## 2024-04-29 DIAGNOSIS — E1169 Type 2 diabetes mellitus with other specified complication: Secondary | ICD-10-CM

## 2024-05-03 ENCOUNTER — Ambulatory Visit: Payer: Self-pay | Admitting: Family Medicine

## 2024-05-30 ENCOUNTER — Other Ambulatory Visit (INDEPENDENT_AMBULATORY_CARE_PROVIDER_SITE_OTHER): Admitting: Pharmacist

## 2024-05-30 DIAGNOSIS — Z7985 Long-term (current) use of injectable non-insulin antidiabetic drugs: Secondary | ICD-10-CM

## 2024-05-30 DIAGNOSIS — Z794 Long term (current) use of insulin: Secondary | ICD-10-CM

## 2024-05-30 DIAGNOSIS — E1169 Type 2 diabetes mellitus with other specified complication: Secondary | ICD-10-CM

## 2024-05-30 NOTE — Progress Notes (Signed)
 "  05/30/2024 Name: Miguel [name] MRN: [id number] DOB: 1970/06/14  Chief Complaint  Patient presents with   Medication Management    Miguel [name] is a 54 y.o. year old male who presented for a telephone visit.   They were referred to the pharmacist by their PCP for assistance in managing diabetes and medication access.      Subjective:   Care Team: Primary Care Provider: Edman Marsa PARAS, DO; Next Scheduled Visit: 10/18/2024  Pain Management: Dr. Oliva [facility] Neurology: [facility] Urology: Penne Knee, MD [facility]    Medication Access/Adherence  Current Pharmacy:  [id number] - [place], [place] - [address] [place] [address] Phone: [phone] Fax: [phone]  [facility] - [place], [place] - [address] [place] [address] Phone: [phone] Fax: [phone]  [facility] - [place], [place] - [address] [place] [address] Phone: [phone] Fax: [phone]   Patient reports affordability concerns with their medications: No  Patient reports access/transportation concerns to their pharmacy: No  Patient reports adherence concerns with their medications:  No     Type 2 Diabetes:   Current medications:  - Mounjaro  7.5 mg weekly on Sundays Reports tolerating well - Jardiance  25 mg daily with breakfast    Medications tried in the past: metformin  (IR GI intolerance/felt ER at tolerated dose was ineffective), Tresiba     Note blood sugar control impacted by receiving steroid infusions and injections: Receives steroid injections ~every 3 months for pain management and steroid infusions every 6 months with multiple sclerosis treatment (Ocrelizumab infusion)   Patient uses Dexcom G7 Continuous Glucose Monitoring (CGM)   Date of Download: 05/30/2024 % Time CGM is active: 54.9% Average Glucose: 147 mg/dL Glucose  Management Indicator: 6.8%  Glucose Variability: 18.9% (goal <36%) Time in Goal:  - Time in range 70-180: 87% - Time above range: 13% - Time below range: 0% *Reports recently had a gap in sensor use, but has replacement sensors from Dexcom         Uses CGM as tool to reinforce/aid with dietary choices and improve blood sugar control   Current physical activity: Walking throughout the day at work/at home walking ~30 minutes x most days/week. Starting back to PT twice weekly for strengthening x 8 weeks   Reports maintaining weight, current home weight ~190 lbs   Statin therapy: atorvastatin  20 mg daily   Patient denies hypoglycemic s/sx including dizziness, shakiness, sweating.  Confirms carries glucose tablets in case of symptoms of hypoglycemia     Objective:  Lab Results  Component Value Date   HGBA1C 6.3 (H) 03/25/2024    Lab Results  Component Value Date   CREATININE 0.66 (L) 03/25/2024   BUN 18 03/25/2024   NA 140 03/25/2024   K 4.0 03/25/2024   CL 102 03/25/2024   CO2 29 03/25/2024    Lab Results  Component Value Date   CHOL 139 03/25/2024   HDL 38 (L) 03/25/2024   LDLCALC 85 03/25/2024   TRIG 72 03/25/2024   CHOLHDL 3.7 03/25/2024   BP Readings from Last 3 Encounters:  03/29/24 122/80  10/06/23 122/80  08/24/23 110/68   Pulse Readings from Last 3 Encounters:  03/29/24 91  10/06/23 86  08/24/23 63     Medications Reviewed Today     Reviewed by Alana Sharyle LABOR, RPH-CPP (Pharmacist) on 05/30/24 at [phone]  Med List Status: <None>   Medication Order Taking? Sig Documenting Provider Last Dose Status Informant  amphetamine-dextroamphetamine (ADDERALL XR) 30 MG 24 hr capsule [id number] Yes Take 30 mg by mouth daily. [provider]  Active   atorvastatin  (LIPITOR) 20 MG tablet [id number] Yes Take 1 tablet (20 mg total) by mouth daily. Karamalegos, Marsa PARAS, DO  Active   baclofen (LIORESAL) 10 MG tablet [id number] Yes Take 1 tablet by mouth  3 (three) times daily as needed. [provider]  Active   cetirizine (ZYRTEC) 10 MG tablet [id number] Yes Take 10 mg by mouth daily as needed for allergies. [provider]  Active   Cholecalciferol (D 1000) 1000 units capsule [id number] Yes Take by mouth.  [provider]  Active            Med Note DERREL, JAMIE A   Thu May 10, 2015  9:44 AM) [facility]  Continuous Glucose Sensor [facility] G7 SENSOR) [place] [id number]  Use as directed. Apply 1 sensor every 10 days. Edman Marsa PARAS, DO  Active   Cyanocobalamin (VITAMIN B-12) 5000 MCG SUBL [id number] Yes Place under the tongue. [provider]  Active            Med Note DERREL, JAMIE A   Thu May 10, 2015  9:44 AM) Received from: [facility]  dalfampridine 10 [place] UA87 [id number] Yes  [provider]  Active   desonide  (DESOWEN ) 0.05 % cream [id number]  Apply topically daily as needed. Edman Marsa PARAS, DO  Active   empagliflozin  (JARDIANCE ) 25 MG TABS tablet [id number] Yes Take 1 tablet (25 mg total) by mouth daily. Karamalegos, Marsa PARAS, DO  Active   gabapentin (NEURONTIN) 300 MG capsule [id number] Yes Take 300 mg by mouth 3 (three) times daily. [provider]  Active            Med Note DERREL, JAMIE A   Thu May 10, 2015  9:44 AM) Received from: [facility]  glucose blood [facility] ULTRA) test strip [id number]  USE TO CHECK BLOOD SUGAR THREE TIMES DAILY AS DIRECTED Edman Marsa PARAS, DO  Active   ibuprofen (ADVIL,MOTRIN) 800 MG tablet [id number] Yes Take 800 mg by mouth every 6 (six) hours as needed. [provider]  Active            Med Note DERREL, JAMIE A   Thu May 10, 2015  9:44 AM) Received from: [facility]  Insulin  Pen Needle (B-D UF III MINI PEN NEEDLES) 31G X 5 MM MISC [id number]  Use to inject insulin .  DX: E11.9 Karamalegos, Alexander J, DO  Active   ipratropium (ATROVENT ) 0.06 % nasal spray [id number]  Place 2 sprays into  both nostrils 4 (four) times daily as needed for rhinitis.  Patient not taking: Reported on 05/30/2024   Edman Marsa PARAS, DO  Active   ketoconazole  (NIZORAL ) 2 % shampoo [id number] Yes Apply 1 application topically daily as needed. Edman Marsa PARAS, DO  Active   Lancets [facility] ULTRASOFT) lancets [id number]  Check blood sugar three times daily. Edman Marsa PARAS, DO  Active   lisinopril  (ZESTRIL ) 2.5 MG tablet [id number] Yes Take 1 tablet (2.5 mg total) by mouth daily. Edman Marsa PARAS, DO  Active   MOUNJARO  7.5 MG/0.5ML Pen [id number] Yes INJECT 7.5 MG SUBCUTANEOUSLY WEEKLY Karamalegos, Marsa PARAS, DO  Active   Multiple Vitamins-Minerals (PRESERVISION AREDS PO) [id number] Yes Take by mouth. [provider]  Active  Ocrelizumab (OCREVUS IV) [id number] Yes Inject into the vein. [provider]  Active   saw palmetto 500 MG capsule [id number] Yes Take 500 mg by mouth daily. [provider]  Active   tiZANidine  (ZANAFLEX ) 4 MG tablet [id number] Yes TAKE 1/2 TO 1 TABLET(2 TO 4 MG) BY MOUTH EVERY 8 HOURS AS NEEDED FOR MUSCLE SPASMS Karamalegos, Marsa PARAS, DO  Active   traMADol (ULTRAM) 50 MG tablet [id number] Yes Take 1 tablet by mouth daily as needed. [provider]  Active               Assessment/Plan:   Diabetes: - Controlled - Recommend to continue Mounjaro  7.5 mg weekly and Jardiance  25 mg daily with breakfast - Counsel on dietary modifications including having regular well-balanced meals throughout the day and controlling carbohydrate portion sizes - Declines interest in changing to newest Dexcom CGM sensor, Dexcom G7 15-day, at this time, but will let me know if would like to in future - Recommend to continue to use CGM as tool to reinforce/aid with dietary choices and improve blood sugar control Patient to continue to use glucometer to complete fingerstick reading when needed for symptoms/unsure of CGM data or if  instructed by CGM device - Have reviewed symptoms of/how to manage low blood sugar. Encourage patient to continue to carry glucose tablets and contact office as needed for any symptoms of hypoglycemia     Follow Up Plan: Clinic Pharmacist will follow up with patient by telephone on 08/08/2024 at 9:00 AM    Sharyle Sia, PharmD, JAQUELINE, CPP Clinical Pharmacist [facility] [phone]   "

## 2024-05-30 NOTE — Patient Instructions (Signed)
Goals Addressed             This Visit's Progress    Pharmacy Goals       Our goal A1c is less than 7%. This corresponds with fasting sugars less than 130 and 2 hour after meal sugars less than 180. Please check your blood sugar and keep a log of the results  Our goal bad cholesterol, or LDL, is less than 70 . This is why it is important to continue taking your atorvastatin.  Feel free to call me with any questions or concerns. I look forward to our next call!  Elisabeth Delles, PharmD, BCACP, CPP Clinical Pharmacist [facility] [phone]        

## 2024-08-08 ENCOUNTER — Other Ambulatory Visit

## 2024-10-17 ENCOUNTER — Ambulatory Visit: Admitting: Family Medicine

## 2024-10-18 ENCOUNTER — Ambulatory Visit: Admitting: Family Medicine
